# Patient Record
Sex: Female | Born: 1985
Health system: Southern US, Community
[De-identification: ages and names within clinical notes are randomized; demographics above are authoritative.]

## PROBLEM LIST (undated history)

## (undated) DIAGNOSIS — F419 Anxiety disorder, unspecified: Secondary | ICD-10-CM

## (undated) DIAGNOSIS — N920 Excessive and frequent menstruation with regular cycle: Secondary | ICD-10-CM

## (undated) DIAGNOSIS — E669 Obesity, unspecified: Secondary | ICD-10-CM

## (undated) DIAGNOSIS — I4891 Unspecified atrial fibrillation: Secondary | ICD-10-CM

## (undated) DIAGNOSIS — I10 Essential (primary) hypertension: Secondary | ICD-10-CM

## (undated) DIAGNOSIS — R112 Nausea with vomiting, unspecified: Secondary | ICD-10-CM

## (undated) DIAGNOSIS — K219 Gastro-esophageal reflux disease without esophagitis: Secondary | ICD-10-CM

## (undated) DIAGNOSIS — O24419 Gestational diabetes mellitus in pregnancy, unspecified control: Secondary | ICD-10-CM

## (undated) DIAGNOSIS — F32A Depression, unspecified: Secondary | ICD-10-CM

## (undated) DIAGNOSIS — N809 Endometriosis, unspecified: Secondary | ICD-10-CM

## (undated) DIAGNOSIS — F329 Major depressive disorder, single episode, unspecified: Secondary | ICD-10-CM

## (undated) DIAGNOSIS — I499 Cardiac arrhythmia, unspecified: Secondary | ICD-10-CM

## (undated) DIAGNOSIS — Z9889 Other specified postprocedural states: Secondary | ICD-10-CM

## (undated) DIAGNOSIS — G43909 Migraine, unspecified, not intractable, without status migrainosus: Secondary | ICD-10-CM

## (undated) HISTORY — PX: OTHER SURGICAL HISTORY: SHX169

## (undated) HISTORY — PX: HERNIA REPAIR: SHX51

## (undated) HISTORY — PX: NASAL SINUS SURGERY: SHX719

## (undated) HISTORY — PX: DILATION AND CURETTAGE OF UTERUS: SHX78

## (undated) HISTORY — PX: WISDOM TOOTH EXTRACTION: SHX21

---
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
## 2005-07-09 ENCOUNTER — Emergency Department: Payer: Self-pay | Admitting: Emergency Medicine

## 2006-01-28 ENCOUNTER — Emergency Department: Payer: Self-pay | Admitting: Emergency Medicine

## 2007-04-20 ENCOUNTER — Emergency Department: Payer: Self-pay | Admitting: Emergency Medicine

## 2007-10-09 ENCOUNTER — Emergency Department: Payer: Self-pay | Admitting: Emergency Medicine

## 2007-10-12 ENCOUNTER — Emergency Department: Payer: Self-pay | Admitting: Emergency Medicine

## 2007-10-18 ENCOUNTER — Ambulatory Visit: Payer: Self-pay | Admitting: Obstetrics and Gynecology

## 2007-12-16 ENCOUNTER — Ambulatory Visit: Payer: Self-pay | Admitting: Obstetrics and Gynecology

## 2007-12-24 ENCOUNTER — Ambulatory Visit: Payer: Self-pay | Admitting: Obstetrics and Gynecology

## 2008-12-06 ENCOUNTER — Emergency Department: Payer: Self-pay | Admitting: Emergency Medicine

## 2009-02-10 ENCOUNTER — Emergency Department: Payer: Self-pay | Admitting: Emergency Medicine

## 2009-04-25 ENCOUNTER — Emergency Department: Payer: Self-pay | Admitting: Emergency Medicine

## 2009-06-01 ENCOUNTER — Observation Stay: Payer: Self-pay

## 2009-06-03 ENCOUNTER — Observation Stay: Payer: Self-pay | Admitting: Obstetrics and Gynecology

## 2009-07-06 ENCOUNTER — Observation Stay: Payer: Self-pay | Admitting: Obstetrics & Gynecology

## 2009-07-12 ENCOUNTER — Observation Stay: Payer: Self-pay

## 2009-07-16 ENCOUNTER — Observation Stay: Payer: Self-pay | Admitting: Obstetrics and Gynecology

## 2009-07-17 ENCOUNTER — Inpatient Hospital Stay: Payer: Self-pay

## 2012-06-30 DIAGNOSIS — J310 Chronic rhinitis: Secondary | ICD-10-CM | POA: Insufficient documentation

## 2013-12-13 ENCOUNTER — Emergency Department: Payer: Self-pay | Admitting: Emergency Medicine

## 2013-12-14 LAB — CBC
HCT: 38.4 % (ref 35.0–47.0)
HGB: 12.3 g/dL (ref 12.0–16.0)
MCH: 29.7 pg (ref 26.0–34.0)
MCHC: 32.1 g/dL (ref 32.0–36.0)
MCV: 93 fL (ref 80–100)
PLATELETS: 341 10*3/uL (ref 150–440)
RBC: 4.15 10*6/uL (ref 3.80–5.20)
RDW: 14.4 % (ref 11.5–14.5)
WBC: 10.7 10*3/uL (ref 3.6–11.0)

## 2013-12-14 LAB — HCG, QUANTITATIVE, PREGNANCY: BETA HCG, QUANT.: 1821 m[IU]/mL — AB

## 2014-03-19 ENCOUNTER — Emergency Department: Payer: Self-pay | Admitting: Emergency Medicine

## 2014-03-19 LAB — COMPREHENSIVE METABOLIC PANEL
ALK PHOS: 49 U/L
ALT: 16 U/L
Albumin: 2.9 g/dL — ABNORMAL LOW (ref 3.4–5.0)
Anion Gap: 8 (ref 7–16)
BUN: 5 mg/dL — ABNORMAL LOW (ref 7–18)
Bilirubin,Total: 0.2 mg/dL (ref 0.2–1.0)
Calcium, Total: 8.6 mg/dL (ref 8.5–10.1)
Chloride: 107 mmol/L (ref 98–107)
Co2: 25 mmol/L (ref 21–32)
Creatinine: 0.55 mg/dL — ABNORMAL LOW (ref 0.60–1.30)
EGFR (Non-African Amer.): >60
Glucose: 98 mg/dL (ref 65–99)
Osmolality: 277 (ref 275–301)
Potassium: 3.3 mmol/L — ABNORMAL LOW (ref 3.5–5.1)
SGOT(AST): 10 U/L — ABNORMAL LOW (ref 15–37)
Sodium: 140 mmol/L (ref 136–145)
Total Protein: 6.7 g/dL (ref 6.4–8.2)

## 2014-03-19 LAB — CBC WITH DIFFERENTIAL/PLATELET
BASOS ABS: 0 10*3/uL (ref 0.0–0.1)
BASOS PCT: 0.3 %
Eosinophil #: 0.1 10*3/uL (ref 0.0–0.7)
Eosinophil %: 1 %
HCT: 33.9 % — AB (ref 35.0–47.0)
HGB: 11 g/dL — ABNORMAL LOW (ref 12.0–16.0)
LYMPHS PCT: 17.4 %
Lymphocyte #: 1.9 10*3/uL (ref 1.0–3.6)
MCH: 30.8 pg (ref 26.0–34.0)
MCHC: 32.5 g/dL (ref 32.0–36.0)
MCV: 95 fL (ref 80–100)
MONOS PCT: 4.8 %
Monocyte #: 0.5 x10 3/mm (ref 0.2–0.9)
Neutrophil #: 8.5 10*3/uL — ABNORMAL HIGH (ref 1.4–6.5)
Neutrophil %: 76.5 %
PLATELETS: 257 10*3/uL (ref 150–440)
RBC: 3.57 10*6/uL — AB (ref 3.80–5.20)
RDW: 13.8 % (ref 11.5–14.5)
WBC: 11.1 10*3/uL — AB (ref 3.6–11.0)

## 2014-03-19 LAB — URINALYSIS, COMPLETE
BACTERIA: NONE SEEN
BILIRUBIN, UR: NEGATIVE
Blood: NEGATIVE
Glucose,UR: NEGATIVE mg/dL (ref 0–75)
Ketone: NEGATIVE
NITRITE: NEGATIVE
Ph: 7 (ref 4.5–8.0)
Protein: NEGATIVE
Specific Gravity: 1.017 (ref 1.003–1.030)
WBC UR: 8 /HPF (ref 0–5)

## 2014-03-19 LAB — LIPASE, BLOOD: LIPASE: 82 U/L (ref 73–393)

## 2014-03-19 LAB — HCG, QUANTITATIVE, PREGNANCY: Beta Hcg, Quant.: 16727 m[IU]/mL — ABNORMAL HIGH

## 2014-06-08 ENCOUNTER — Ambulatory Visit: Payer: Self-pay | Admitting: Obstetrics and Gynecology

## 2014-06-09 ENCOUNTER — Ambulatory Visit: Payer: Self-pay | Admitting: Obstetrics and Gynecology

## 2014-06-29 ENCOUNTER — Observation Stay: Payer: Self-pay | Admitting: Obstetrics & Gynecology

## 2014-06-29 LAB — URINALYSIS, COMPLETE
BLOOD: NEGATIVE
Bilirubin,UR: NEGATIVE
Glucose,UR: NEGATIVE mg/dL (ref 0–75)
Ketone: NEGATIVE
Nitrite: NEGATIVE
PH: 6 (ref 4.5–8.0)
PROTEIN: NEGATIVE
RBC,UR: 2 /HPF (ref 0–5)
Specific Gravity: 1.021 (ref 1.003–1.030)
Squamous Epithelial: 12
WBC UR: 64 /HPF (ref 0–5)

## 2014-06-29 LAB — FETAL FIBRONECTIN
APPEARANCE: ABNORMAL
FETAL FIBRONECTIN: POSITIVE

## 2014-07-10 ENCOUNTER — Ambulatory Visit: Payer: Self-pay | Admitting: Obstetrics and Gynecology

## 2014-07-31 ENCOUNTER — Inpatient Hospital Stay: Payer: Self-pay

## 2014-10-17 NOTE — H&P (Signed)
L&D Evaluation:  History Expanded:  HPI 29 yo G3P2 at 32 5/[redacted] weeks EGA w contractions since 0300 today.  Some d/c, no LOF.  No vaginal bleeding.   Prenatal Care at [facility]. cHTN, on Labetalol, GDM, A1. A+, VI, RI. Desires Bilateral Tubal Ligation. Flu and TDaP UTD.   Gravida 3   Term 2   PreTerm 0   Abortion 0   Living 2   Blood Type (Maternal) A positive   Group B Strep Results Maternal (Result >5wks must be treated as unknown) unknown/result > 5 weeks ago  + in past pregnancies   [facility] 19-Aug-2014   Presents with contractions   Patient's Medical History No Chronic Illness   Patient's Surgical History none   Medications Pre Natal Vitamins   Allergies NKDA   Social History none   Family History Non-Contributory   ROS:  ROS All systems were reviewed.  HEENT, CNS, GI, GU, Respiratory, CV, Renal and Musculoskeletal systems were found to be normal.   Exam:  Vital Signs stable   General no apparent distress   Mental Status clear   Chest clear   Heart normal sinus rhythm, no murmur/gallop/rubs   Abdomen gravid, non-tender   Estimated Fetal Weight Average for gestational age   Back no CVAT   Edema no edema   Pelvic no external lesions, cl/50/-3   Mebranes Intact, - nitrazine and - ferning   FHT normal rate with no decels   Fetal Heart Rate 140   Ucx irregular   Skin dry   Other fetal fibronectin +, UA + for UTI   Impression:  Impression UTI, and Risks for Preterm Labor.   Plan:  Plan EFM/NST, monitor contractions and for cervical change, monitor BP   Comments Steroids Fluids ABX for UTI, cont as outpatient. Bed rest  Risks of Preterm Labor realted to +fFN discussed w patient.   Electronic Signatures: Hoyt Koch (MD)  (Signed 21-Jan-16 16:31)  Authored: L&D Evaluation   Last Updated: 21-Jan-16 16:31 by Hoyt Koch (MD)

## 2015-08-29 ENCOUNTER — Emergency Department
Admission: EM | Admit: 2015-08-29 | Discharge: 2015-08-29 | Disposition: A | Discharge status: Home / Self Care | Payer: Medicaid Other | Attending: Emergency Medicine | Admitting: Emergency Medicine

## 2015-08-29 ENCOUNTER — Emergency Department: Payer: Medicaid Other

## 2015-08-29 ENCOUNTER — Encounter: Payer: Self-pay | Admitting: Medical Oncology

## 2015-08-29 DIAGNOSIS — R1033 Periumbilical pain: Secondary | ICD-10-CM | POA: Diagnosis not present

## 2015-08-29 DIAGNOSIS — R1011 Right upper quadrant pain: Secondary | ICD-10-CM | POA: Insufficient documentation

## 2015-08-29 DIAGNOSIS — R112 Nausea with vomiting, unspecified: Secondary | ICD-10-CM

## 2015-08-29 DIAGNOSIS — Z3202 Encounter for pregnancy test, result negative: Secondary | ICD-10-CM | POA: Insufficient documentation

## 2015-08-29 DIAGNOSIS — R1013 Epigastric pain: Secondary | ICD-10-CM | POA: Diagnosis not present

## 2015-08-29 DIAGNOSIS — I10 Essential (primary) hypertension: Secondary | ICD-10-CM | POA: Insufficient documentation

## 2015-08-29 DIAGNOSIS — R1032 Left lower quadrant pain: Secondary | ICD-10-CM | POA: Insufficient documentation

## 2015-08-29 DIAGNOSIS — R109 Unspecified abdominal pain: Secondary | ICD-10-CM

## 2015-08-29 HISTORY — DX: Gastro-esophageal reflux disease without esophagitis: K21.9

## 2015-08-29 HISTORY — DX: Essential (primary) hypertension: I10

## 2015-08-29 LAB — COMPREHENSIVE METABOLIC PANEL
ALBUMIN: 4 g/dL (ref 3.5–5.0)
ALT: 18 U/L (ref 14–54)
AST: 26 U/L (ref 15–41)
Alkaline Phosphatase: 56 U/L (ref 38–126)
Anion gap: 7 (ref 5–15)
BILIRUBIN TOTAL: 0.5 mg/dL (ref 0.3–1.2)
BUN: 10 mg/dL (ref 6–20)
CO2: 25 mmol/L (ref 22–32)
CREATININE: 0.62 mg/dL (ref 0.44–1.00)
Calcium: 9 mg/dL (ref 8.9–10.3)
Chloride: 106 mmol/L (ref 101–111)
GFR calc Af Amer: >60 mL/min (ref >60)
GLUCOSE: 124 mg/dL — AB (ref 65–99)
POTASSIUM: 3.2 mmol/L — AB (ref 3.5–5.1)
SODIUM: 138 mmol/L (ref 135–145)
TOTAL PROTEIN: 7.3 g/dL (ref 6.5–8.1)

## 2015-08-29 LAB — URINALYSIS COMPLETE WITH MICROSCOPIC (ARMC ONLY)
Bilirubin Urine: NEGATIVE
GLUCOSE, UA: NEGATIVE mg/dL
Nitrite: NEGATIVE
PH: 6 (ref 5.0–8.0)
Protein, ur: 30 mg/dL — AB
Specific Gravity, Urine: 1.024 (ref 1.005–1.030)

## 2015-08-29 LAB — CBC WITH DIFFERENTIAL/PLATELET
BASOS ABS: 0 10*3/uL (ref 0–0.1)
Basophils Relative: 1 %
EOS ABS: 0.1 10*3/uL (ref 0–0.7)
EOS PCT: 2 %
HCT: 37.3 % (ref 35.0–47.0)
Hemoglobin: 12.7 g/dL (ref 12.0–16.0)
LYMPHS PCT: 27 %
Lymphs Abs: 1.6 10*3/uL (ref 1.0–3.6)
MCH: 27.9 pg (ref 26.0–34.0)
MCHC: 33.9 g/dL (ref 32.0–36.0)
MCV: 82.3 fL (ref 80.0–100.0)
Monocytes Absolute: 0.3 10*3/uL (ref 0.2–0.9)
Monocytes Relative: 6 %
NEUTROS PCT: 64 %
Neutro Abs: 3.8 10*3/uL (ref 1.4–6.5)
PLATELETS: 349 10*3/uL (ref 150–440)
RBC: 4.54 MIL/uL (ref 3.80–5.20)
RDW: 15 % — ABNORMAL HIGH (ref 11.5–14.5)
WBC: 5.8 10*3/uL (ref 3.6–11.0)

## 2015-08-29 LAB — POCT PREGNANCY, URINE: PREG TEST UR: NEGATIVE

## 2015-08-29 LAB — LIPASE, BLOOD: LIPASE: 21 U/L (ref 11–51)

## 2015-08-29 MED ORDER — SODIUM CHLORIDE 0.9 % IV BOLUS (SEPSIS)
1000.0000 mL | Freq: Once | INTRAVENOUS | Status: AC
  Administered 2015-08-29: 1000 mL via INTRAVENOUS

## 2015-08-29 MED ORDER — DICYCLOMINE HCL 20 MG PO TABS: 20.0000 mg | ORAL_TABLET | Freq: Three times a day (TID) | ORAL | Status: DC | PRN

## 2015-08-29 MED ORDER — IOPAMIDOL (ISOVUE-300) INJECTION 61%
100.0000 mL | Freq: Once | INTRAVENOUS | Status: AC | PRN
  Administered 2015-08-29: 100 mL via INTRAVENOUS

## 2015-08-29 MED ORDER — ONDANSETRON HCL 4 MG/2ML IJ SOLN
4.0000 mg | Freq: Once | INTRAMUSCULAR | Status: AC
  Administered 2015-08-29: 4 mg via INTRAVENOUS
  Filled 2015-08-29: qty 2

## 2015-08-29 MED ORDER — ONDANSETRON HCL 4 MG PO TABS: 4.0000 mg | ORAL_TABLET | Freq: Three times a day (TID) | ORAL | Status: DC | PRN

## 2015-08-29 MED ORDER — DICYCLOMINE HCL 20 MG PO TABS: 20.0000 mg | ORAL_TABLET | Freq: Four times a day (QID) | ORAL | Status: DC

## 2015-08-29 MED ORDER — ONDANSETRON HCL 4 MG PO TABS: 4.0000 mg | ORAL_TABLET | Freq: Every day | ORAL | Status: DC | PRN

## 2015-08-29 MED ORDER — IOHEXOL 240 MG/ML SOLN
25.0000 mL | Freq: Once | INTRAMUSCULAR | Status: AC | PRN
  Administered 2015-08-29: 25 mL via INTRAVENOUS

## 2015-08-29 NOTE — ED Notes (Signed)
Pt reports abd pain to center of abdomen with nausea.

## 2015-08-29 NOTE — ED Notes (Signed)
Pt finished drinking oral contrast, Sean in CT notified.

## 2015-08-29 NOTE — ED Notes (Signed)
Report called to [facility] RN

## 2015-08-29 NOTE — Discharge Instructions (Signed)
Abdominal Pain, Adult Many things can cause belly (abdominal) pain. Most times, the belly pain is not dangerous. Many cases of belly pain can be watched and treated at home. HOME CARE   Do not take medicines that help you go poop (laxatives) unless told to by your doctor.  Only take medicine as told by your doctor.  Eat or drink as told by your doctor. Your doctor will tell you if you should be on a special diet. GET HELP IF:  You do not know what is causing your belly pain.  You have belly pain while you are sick to your stomach (nauseous) or have runny poop (diarrhea).  You have pain while you pee or poop.  Your belly pain wakes you up at night.  You have belly pain that gets worse or better when you eat.  You have belly pain that gets worse when you eat fatty foods.  You have a fever. GET HELP RIGHT AWAY IF:   The pain does not go away within 2 hours.  You keep throwing up (vomiting).  The pain changes and is only in the right or left part of the belly.  You have bloody or tarry looking poop. MAKE SURE YOU:   Understand these instructions.  Will watch your condition.  Will get help right away if you are not doing well or get worse.   This information is not intended to replace advice given to you by your health care provider. Make sure you discuss any questions you have with your health care provider.   Document Released: 11/12/2007 Document Revised: 06/16/2014 Document Reviewed: 02/02/2013 Elsevier Interactive Patient Education 2016 Elsevier Inc.  Nausea and Vomiting Nausea means you feel sick to your stomach. Throwing up (vomiting) is a reflex where stomach contents come out of your mouth. HOME CARE   Take medicine as told by your doctor.  Do not force yourself to eat. However, you do need to drink fluids.  If you feel like eating, eat a normal diet as told by your doctor.  Eat rice, wheat, potatoes, bread, lean meats, yogurt, fruits, and  vegetables.  Avoid high-fat foods.  Drink enough fluids to keep your pee (urine) clear or pale yellow.  Ask your doctor how to replace body fluid losses (rehydrate). Signs of body fluid loss (dehydration) include:  Feeling very thirsty.  Dry lips and mouth.  Feeling dizzy.  Dark pee.  Peeing less than normal.  Feeling confused.  Fast breathing or heart rate. GET HELP RIGHT AWAY IF:   You have blood in your throw up.  You have black or bloody poop (stool).  You have a bad headache or stiff neck.  You feel confused.  You have bad belly (abdominal) pain.  You have chest pain or trouble breathing.  You do not pee at least once every 8 hours.  You have cold, clammy skin.  You keep throwing up after 24 to 48 hours.  You have a fever. MAKE SURE YOU:   Understand these instructions.  Will watch your condition.  Will get help right away if you are not doing well or get worse.   This information is not intended to replace advice given to you by your health care provider. Make sure you discuss any questions you have with your health care provider.   Document Released: 11/12/2007 Document Revised: 08/18/2011 Document Reviewed: 10/25/2010 Elsevier Interactive Patient Education [employer].

## 2015-08-29 NOTE — ED Provider Notes (Signed)
[facility] Emergency Department Provider Note  ____________________________________________  Time seen: Approximately 850 AM  I have reviewed the triage vital signs and the nursing notes.   HISTORY  Chief Complaint Abdominal Pain    HPI GEARLENE DEASY is a 30 y.o. female with a history of hypertension, GERD and endometriosis who is presenting to the emergency department today with periumbilical abdominal pain. She says the pain is cramping and intermittent. She only has mild pain right now. She said there was a period of time with the pain radiated through to her back. She had one episode of nonbloody vomitus. Denies any diarrhea. No known sick contacts. No history of kidney stones. Says that she has very irregular periods and does not think she is pregnant. Denies any vaginal discharge or bleeding at this time and says she did have a small amount of red blood yesterday vaginally. She said it is not abnormal for her to bleed intermittently.   Past Medical History  Diagnosis Date  . Hypertension   . GERD (gastroesophageal reflux disease)     There are no active problems to display for this patient.   Past Surgical History  Procedure Laterality Date  . Endometrial surgery    . Nasal sinus surgery      No current outpatient prescriptions on file.  Allergies Review of patient's allergies indicates no known allergies.  No family history on file.  Social History Social History  Substance Use Topics  . Smoking status: Never Smoker   . Smokeless tobacco: None  . Alcohol Use: None    Review of Systems Constitutional: No fever/chills Eyes: No visual changes. ENT: No sore throat. Cardiovascular: Denies chest pain. Respiratory: Denies shortness of breath. Gastrointestinal:   No diarrhea.  No constipation. Genitourinary: Negative for dysuria. Musculoskeletal: Negative for back pain. Skin: Negative for rash. Neurological: Negative for headaches,  focal weakness or numbness.  10-point ROS otherwise negative.  ____________________________________________   PHYSICAL EXAM:  VITAL SIGNS: ED Triage Vitals  Enc Vitals Group     BP 08/29/15 0836 152/97 mmHg     Pulse Rate 08/29/15 0836 107     Resp 08/29/15 0836 18     Temp 08/29/15 0836 98.1 F (36.7 C)     Temp Source 08/29/15 0836 Oral     SpO2 08/29/15 0836 98 %     Weight 08/29/15 0836 180 lb (81.647 kg)     Height 08/29/15 0836 5\' 1"  (1.549 m)     Head Cir --      Peak Flow --      Pain Score 08/29/15 0837 4     Pain Loc --      Pain Edu? --      Excl. in [place]? --     Constitutional: Alert and oriented. Well appearing and in no acute distress. Eyes: Conjunctivae are normal. PERRL. EOMI. Head: Atraumatic. Nose: No congestion/rhinnorhea. Mouth/Throat: Mucous membranes are moist.   Neck: No stridor.   Cardiovascular: Normal rate, regular rhythm. Grossly normal heart sounds.   Respiratory: Normal respiratory effort.  No retractions. Lungs CTAB. Gastrointestinal: Soft with tenderness periumbilically as well as the left lower quadrant and epigastrium. Mild right upper quadrant tenderness palpation with a negative Murphy sign. There is no tenderness over McBurney's point. For a mild left upper quadrant tenderness.. No distention.  No CVA tenderness. Musculoskeletal: No lower extremity tenderness nor edema.  No joint effusions. Neurologic:  Normal speech and language. No gross focal neurologic deficits are appreciated.  No gait instability. Skin:  Skin is warm, dry and intact. No rash noted. Psychiatric: Mood and affect are normal. Speech and behavior are normal.  ____________________________________________   LABS (all labs ordered are listed, but only abnormal results are displayed)  Labs Reviewed  CBC WITH DIFFERENTIAL/PLATELET - Abnormal; Notable for the following:    RDW 15.0 (*)    All other components within normal limits  COMPREHENSIVE METABOLIC PANEL -  Abnormal; Notable for the following:    Potassium 3.2 (*)    Glucose, Bld 124 (*)    All other components within normal limits  URINALYSIS COMPLETEWITH MICROSCOPIC ([facility] ONLY) - Abnormal; Notable for the following:    Color, Urine YELLOW (*)    APPearance HAZY (*)    Ketones, ur TRACE (*)    Hgb urine dipstick 2+ (*)    Protein, ur 30 (*)    Leukocytes, UA TRACE (*)    Bacteria, UA RARE (*)    Squamous Epithelial / LPF 0-5 (*)    All other components within normal limits  URINE CULTURE  LIPASE, BLOOD  POC URINE PREG, ED  POCT PREGNANCY, URINE   ____________________________________________  EKG  ED ECG REPORT I, Schaevitz,  Youlanda Roys, the attending physician, personally viewed and interpreted this ECG.   Date: 08/29/2015  EKG Time: 844  Rate: 92  Rhythm: normal EKG, normal sinus rhythm  Axis: Normal  Intervals:none  ST&T Change: No ST segment elevation or depression. No abnormal T-wave inversion.  ____________________________________________  RADIOLOGY   IMPRESSION: No acute intra-abdominal or pelvic findings.  Prominent parametrial veins, which in the appropriate clinical setting, could indicate pelvic congestion syndrome.   Electronically Signed By: Staci Righter M.D. On: 08/29/2015 10:43       ____________________________________________   PROCEDURES   ____________________________________________   INITIAL IMPRESSION / ASSESSMENT AND PLAN / ED COURSE  Pertinent labs & imaging results that were available during my care of the patient were reviewed by me and considered in my medical decision making (see chart for details).  Asked patient if she would like pain medications that she is more concerned about her nausea at this time. We'll give Zofran and IV fluids.  ----------------------------------------- 11:35 AM on 08/29/2015 -----------------------------------------  Patient says she feels better with Zofran. No vomiting in the emergency  department. Reviewed the labs as well as imaging with the patient. She is a small amount of blood in her urine which is likely related to her vaginal bleeding from yesterday. Also with pelvic congestion. Unclear etiology of this this time. Last intercourse was this past Sunday. We'll give Zofran as well as Bentyl. Patient also with a history of endometriosis. Says this feels similar to previous episodes of endometriosis but has had a debulking surgery and has not any symptoms in about 6 years. She'll be following up with her primary care doctor and possibly her OB/GYN of the symptoms continue. We'll discharge to home. No pelvic masses seen on the CAT scan.  unLikely to be a torsion. ____________________________________________   FINAL CLINICAL IMPRESSION(S) / ED DIAGNOSES  Abdominal pain with nausea and vomiting.    Orbie Pyo, MD 08/29/15 1136

## 2015-08-31 LAB — URINE CULTURE: Culture: NO GROWTH

## 2015-10-16 ENCOUNTER — Other Ambulatory Visit: Payer: Self-pay | Admitting: Family Medicine

## 2015-10-16 DIAGNOSIS — N12 Tubulo-interstitial nephritis, not specified as acute or chronic: Secondary | ICD-10-CM

## 2015-10-19 ENCOUNTER — Ambulatory Visit
Admission: RE | Admit: 2015-10-19 | Discharge: 2015-10-19 | Disposition: A | Discharge status: Home / Self Care | Payer: Medicaid Other | Source: Ambulatory Visit | Attending: Family Medicine | Admitting: Family Medicine

## 2015-10-19 DIAGNOSIS — K8020 Calculus of gallbladder without cholecystitis without obstruction: Secondary | ICD-10-CM | POA: Insufficient documentation

## 2015-10-19 DIAGNOSIS — N281 Cyst of kidney, acquired: Secondary | ICD-10-CM | POA: Diagnosis not present

## 2015-10-19 DIAGNOSIS — N12 Tubulo-interstitial nephritis, not specified as acute or chronic: Secondary | ICD-10-CM | POA: Diagnosis present

## 2015-11-27 ENCOUNTER — Encounter: Payer: Self-pay | Admitting: *Deleted

## 2015-11-27 ENCOUNTER — Inpatient Hospital Stay: Admission: RE | Admit: 2015-11-27 | Payer: Medicaid Other | Source: Ambulatory Visit

## 2015-11-27 NOTE — Patient Instructions (Signed)
  Your procedure is scheduled on: 11-29-15 [facility]) Report to Same Day Surgery 2nd floor medical mall To find out your arrival time please call [phone] between 1PM - 3PM on 11-28-15 [facility])  Remember: Instructions that are not followed completely may result in serious medical risk, up to and including death, or upon the discretion of your surgeon and anesthesiologist your surgery may need to be rescheduled.    _x___ 1. Do not eat food or drink liquids after midnight. No gum chewing or hard candies.     __x__ 2. No Alcohol for 24 hours before or after surgery.   __x__3. No Smoking for 24 prior to surgery.   ____  4. Bring all medications with you on the day of surgery if instructed.    __x__ 5. Notify your doctor if there is any change in your medical condition     (cold, fever, infections).     Do not wear jewelry, make-up, hairpins, clips or nail polish.  Do not wear lotions, powders, or perfumes. You may wear deodorant.  Do not shave 48 hours prior to surgery. Men may shave face and neck.  Do not bring valuables to the hospital.    [facility] is not responsible for any belongings or valuables.               Contacts, dentures or bridgework may not be worn into surgery.  Leave your suitcase in the car. After surgery it may be brought to your room.  For patients admitted to the hospital, discharge time is determined by your treatment team.   Patients discharged the day of surgery will not be allowed to drive home.    Please read over the following fact sheets that you were given:   [facility] Preparing for Surgery and or MRSA Information   _x___ Take these medicines the morning of surgery with A SIP OF WATER:    1. OMEPRAZOLE (PRILOSEC)  2. TAKE AN EXTRA PRILOSEC Wednesday NIGHT BEFORE BED  3.  4.  5.  6.  ____ Fleet Enema (as directed)   _x___ Use CHG Soap or sage wipes as directed on instruction sheet   ____ Use inhalers on the day of surgery and bring to  hospital day of surgery  ____ Stop metformin 2 days prior to surgery    ____ Take 1/2 of usual insulin dose the night before surgery and none on the morning of surgery.   ____ Stop aspirin or coumadin, or plavix  _x__ Stop Anti-inflammatories such as Advil, Aleve, Ibuprofen, Motrin, Naproxen,          Naprosyn, Goodies powders or aspirin products. Ok to take Tylenol.   ____ Stop supplements until after surgery.    ____ Bring C-Pap to the hospital.

## 2015-11-28 ENCOUNTER — Encounter
Admission: RE | Admit: 2015-11-28 | Discharge: 2015-11-28 | Disposition: A | Discharge status: Home / Self Care | Payer: Medicaid Other | Source: Ambulatory Visit | Attending: Obstetrics and Gynecology | Admitting: Obstetrics and Gynecology

## 2015-11-28 DIAGNOSIS — Z01812 Encounter for preprocedural laboratory examination: Secondary | ICD-10-CM | POA: Diagnosis present

## 2015-11-28 LAB — POTASSIUM: Potassium: 3.7 mmol/L (ref 3.5–5.1)

## 2015-11-29 ENCOUNTER — Encounter: Payer: Self-pay | Admitting: *Deleted

## 2015-11-29 ENCOUNTER — Ambulatory Visit: Payer: Medicaid Other | Admitting: Certified Registered Nurse Anesthetist

## 2015-11-29 ENCOUNTER — Encounter
Admission: RE | Disposition: A | Discharge status: Home / Self Care | Payer: Self-pay | Source: Ambulatory Visit | Attending: Obstetrics and Gynecology

## 2015-11-29 ENCOUNTER — Ambulatory Visit
Admission: RE | Admit: 2015-11-29 | Discharge: 2015-11-29 | Disposition: A | Discharge status: Home / Self Care | Payer: Medicaid Other | Source: Ambulatory Visit | Attending: Obstetrics and Gynecology | Admitting: Obstetrics and Gynecology

## 2015-11-29 DIAGNOSIS — F329 Major depressive disorder, single episode, unspecified: Secondary | ICD-10-CM | POA: Diagnosis not present

## 2015-11-29 DIAGNOSIS — I10 Essential (primary) hypertension: Secondary | ICD-10-CM | POA: Diagnosis not present

## 2015-11-29 DIAGNOSIS — Z79899 Other long term (current) drug therapy: Secondary | ICD-10-CM | POA: Insufficient documentation

## 2015-11-29 DIAGNOSIS — N946 Dysmenorrhea, unspecified: Secondary | ICD-10-CM | POA: Insufficient documentation

## 2015-11-29 DIAGNOSIS — K219 Gastro-esophageal reflux disease without esophagitis: Secondary | ICD-10-CM | POA: Insufficient documentation

## 2015-11-29 DIAGNOSIS — N920 Excessive and frequent menstruation with regular cycle: Secondary | ICD-10-CM | POA: Diagnosis not present

## 2015-11-29 DIAGNOSIS — Z9889 Other specified postprocedural states: Secondary | ICD-10-CM

## 2015-11-29 HISTORY — PX: LAPAROSCOPY: SHX197

## 2015-11-29 HISTORY — PX: HYSTEROSCOPY WITH D & C: SHX1775

## 2015-11-29 LAB — POCT PREGNANCY, URINE: Preg Test, Ur: NEGATIVE

## 2015-11-29 SURGERY — LAPAROSCOPY, DIAGNOSTIC
Anesthesia: General

## 2015-11-29 MED ORDER — LIDOCAINE HCL (CARDIAC) 20 MG/ML IV SOLN
INTRAVENOUS | Status: DC | PRN
  Administered 2015-11-29: 60 mg via INTRAVENOUS

## 2015-11-29 MED ORDER — SUGAMMADEX SODIUM 200 MG/2ML IV SOLN
INTRAVENOUS | Status: DC | PRN
  Administered 2015-11-29: 159.6 mg via INTRAVENOUS

## 2015-11-29 MED ORDER — BUPIVACAINE HCL 0.5 % IJ SOLN
INTRAMUSCULAR | Status: DC | PRN
  Administered 2015-11-29: 10 mL

## 2015-11-29 MED ORDER — BUPIVACAINE HCL (PF) 0.5 % IJ SOLN
INTRAMUSCULAR | Status: AC
  Filled 2015-11-29: qty 30

## 2015-11-29 MED ORDER — FENTANYL CITRATE (PF) 100 MCG/2ML IJ SOLN
INTRAMUSCULAR | Status: DC | PRN
  Administered 2015-11-29 (×2): 50 ug via INTRAVENOUS

## 2015-11-29 MED ORDER — MIDAZOLAM HCL 2 MG/2ML IJ SOLN
INTRAMUSCULAR | Status: DC | PRN
  Administered 2015-11-29: 2 mg via INTRAVENOUS

## 2015-11-29 MED ORDER — OXYCODONE-ACETAMINOPHEN 5-325 MG PO TABS: 1.0000 | ORAL_TABLET | ORAL | Status: DC | PRN

## 2015-11-29 MED ORDER — ACETAMINOPHEN 10 MG/ML IV SOLN
INTRAVENOUS | Status: DC | PRN
  Administered 2015-11-29: 1000 mg via INTRAVENOUS

## 2015-11-29 MED ORDER — KETOROLAC TROMETHAMINE 30 MG/ML IJ SOLN
INTRAMUSCULAR | Status: DC | PRN
  Administered 2015-11-29: 30 mg via INTRAVENOUS

## 2015-11-29 MED ORDER — ONDANSETRON HCL 4 MG/2ML IJ SOLN
INTRAMUSCULAR | Status: DC | PRN
  Administered 2015-11-29: 4 mg via INTRAVENOUS

## 2015-11-29 MED ORDER — LACTATED RINGERS IV SOLN
INTRAVENOUS | Status: DC
  Administered 2015-11-29 (×2): via INTRAVENOUS

## 2015-11-29 MED ORDER — DEXAMETHASONE SODIUM PHOSPHATE 10 MG/ML IJ SOLN
INTRAMUSCULAR | Status: DC | PRN
  Administered 2015-11-29: 10 mg via INTRAVENOUS

## 2015-11-29 MED ORDER — FENTANYL CITRATE (PF) 100 MCG/2ML IJ SOLN
25.0000 ug | INTRAMUSCULAR | Status: DC | PRN
  Administered 2015-11-29 (×4): 25 ug via INTRAVENOUS

## 2015-11-29 MED ORDER — ONDANSETRON HCL 4 MG/2ML IJ SOLN
4.0000 mg | Freq: Once | INTRAMUSCULAR | Status: AC | PRN
  Administered 2015-11-29: 4 mg via INTRAVENOUS

## 2015-11-29 MED ORDER — ROCURONIUM BROMIDE 100 MG/10ML IV SOLN
INTRAVENOUS | Status: DC | PRN
  Administered 2015-11-29: 30 mg via INTRAVENOUS
  Administered 2015-11-29: 10 mg via INTRAVENOUS

## 2015-11-29 MED ORDER — ONDANSETRON HCL 4 MG/2ML IJ SOLN
INTRAMUSCULAR | Status: AC
  Filled 2015-11-29: qty 2

## 2015-11-29 MED ORDER — LABETALOL HCL 5 MG/ML IV SOLN
INTRAVENOUS | Status: DC | PRN
  Administered 2015-11-29: 10 mg via INTRAVENOUS

## 2015-11-29 MED ORDER — FENTANYL CITRATE (PF) 100 MCG/2ML IJ SOLN
INTRAMUSCULAR | Status: AC
  Filled 2015-11-29: qty 2

## 2015-11-29 MED ORDER — IBUPROFEN 800 MG PO TABS: 800.0000 mg | ORAL_TABLET | Freq: Three times a day (TID) | ORAL | Status: DC | PRN

## 2015-11-29 MED ORDER — PROPOFOL 10 MG/ML IV BOLUS
INTRAVENOUS | Status: DC | PRN
  Administered 2015-11-29: 160 mg via INTRAVENOUS
  Administered 2015-11-29: 60 mg via INTRAVENOUS

## 2015-11-29 MED ORDER — PHENYLEPHRINE HCL 10 MG/ML IJ SOLN
INTRAMUSCULAR | Status: DC | PRN
  Administered 2015-11-29 (×4): 100 ug via INTRAVENOUS

## 2015-11-29 SURGICAL SUPPLY — 43 items
ABLATOR ENDOMETRIAL MYOSURE (ABLATOR) IMPLANT
BLADE SURG SZ11 CARB STEEL (BLADE) ×4 IMPLANT
CANISTER SUC SOCK COL 7IN (MISCELLANEOUS) ×4 IMPLANT
CANISTER SUCT 1200ML W/VALVE (MISCELLANEOUS) ×4 IMPLANT
CATH ROBINSON RED A/P 16FR (CATHETERS) ×4 IMPLANT
CHLORAPREP W/TINT 26ML (MISCELLANEOUS) ×4 IMPLANT
DEVICE MYOSURE LITE (MISCELLANEOUS) IMPLANT
DRSG TEGADERM 2-3/8X2-3/4 SM (GAUZE/BANDAGES/DRESSINGS) ×4 IMPLANT
ELECT REM PT RETURN 9FT ADLT (ELECTROSURGICAL) ×4
ELECTRODE REM PT RTRN 9FT ADLT (ELECTROSURGICAL) ×2 IMPLANT
GAUZE SPONGE NON-WVN 2X2 STRL (MISCELLANEOUS) ×2 IMPLANT
GLOVE BIO SURGEON STRL SZ7 (GLOVE) ×4 IMPLANT
GLOVE INDICATOR 7.5 STRL GRN (GLOVE) ×32 IMPLANT
GOWN STRL REUS W/ TWL LRG LVL3 (GOWN DISPOSABLE) ×4 IMPLANT
GOWN STRL REUS W/TWL LRG LVL3 (GOWN DISPOSABLE) ×4
IRRIGATION STRYKERFLOW (MISCELLANEOUS) IMPLANT
IRRIGATOR STRYKERFLOW (MISCELLANEOUS)
IV LACTATED RINGERS 1000ML (IV SOLUTION) ×4 IMPLANT
KIT RM TURNOVER CYSTO AR (KITS) ×4 IMPLANT
LABEL OR SOLS (LABEL) ×4 IMPLANT
LIQUID BAND (GAUZE/BANDAGES/DRESSINGS) ×4 IMPLANT
NS IRRIG 500ML POUR BTL (IV SOLUTION) ×4 IMPLANT
PACK DNC HYST (MISCELLANEOUS) IMPLANT
PACK GYN LAPAROSCOPIC (MISCELLANEOUS) ×4 IMPLANT
PAD OB MATERNITY 4.3X12.25 (PERSONAL CARE ITEMS) ×4 IMPLANT
PAD PREP 24X41 OB/GYN DISP (PERSONAL CARE ITEMS) ×4 IMPLANT
SCISSORS METZENBAUM CVD 33 (INSTRUMENTS) IMPLANT
SEAL ROD LENS SCOPE MYOSURE (ABLATOR) ×4 IMPLANT
SHEARS HARMONIC ACE PLUS 36CM (ENDOMECHANICALS) IMPLANT
SLEEVE ENDOPATH XCEL 5M (ENDOMECHANICALS) ×4 IMPLANT
SOL .9 NS 3000ML IRR  AL (IV SOLUTION)
SOL .9 NS 3000ML IRR UROMATIC (IV SOLUTION) IMPLANT
SPONGE VERSALON 2X2 STRL (MISCELLANEOUS) ×2
SUT MNCRL AB 4-0 PS2 18 (SUTURE) ×4 IMPLANT
SUT VIC AB 0 CT2 27 (SUTURE) IMPLANT
SYRINGE 10CC LL (SYRINGE) ×4 IMPLANT
TOWEL OR 17X26 4PK STRL BLUE (TOWEL DISPOSABLE) ×4 IMPLANT
TROCAR ENDO BLADELESS 11MM (ENDOMECHANICALS) IMPLANT
TROCAR XCEL NON-BLD 5MMX100MML (ENDOMECHANICALS) ×4 IMPLANT
TUBING CONNECTING 10 (TUBING) ×3 IMPLANT
TUBING CONNECTING 10' (TUBING) ×1
TUBING HYSTEROSCOPY DOLPHIN (MISCELLANEOUS) ×4 IMPLANT
TUBING INSUFFLATOR HI FLOW (MISCELLANEOUS) ×4 IMPLANT

## 2015-11-29 NOTE — H&P (Signed)
Initial paper  H&P on chart:   History reviewed, patient examined, no change in status, stable for surgery.  

## 2015-11-29 NOTE — Anesthesia Procedure Notes (Signed)
Procedure Name: Intubation Performed by: Demetrius Charity Pre-anesthesia Checklist: Patient identified, Patient being monitored, Timeout performed, Emergency Drugs available and Suction available Patient Re-evaluated:Patient Re-evaluated prior to inductionOxygen Delivery Method: Circle system utilized Preoxygenation: Pre-oxygenation with 100% oxygen Intubation Type: IV induction Ventilation: Mask ventilation without difficulty Laryngoscope Size: Mac and 3 Grade View: Grade III Tube type: Oral Tube size: 7.0 mm Number of attempts: 1 Airway Equipment and Method: Stylet Placement Confirmation: ETT inserted through vocal cords under direct vision,  positive ETCO2 and breath sounds checked- equal and bilateral Secured at: 21 cm Tube secured with: Tape Dental Injury: Teeth and Oropharynx as per pre-operative assessment

## 2015-11-29 NOTE — Transfer of Care (Signed)
Immediate Anesthesia Transfer of Care Note  Patient: Debra Whitney  Procedure(s) Performed: Procedure(s): LAPAROSCOPY DIAGNOSTIC (N/A) DILATATION AND CURETTAGE /HYSTEROSCOPY  Patient Location: PACU  Anesthesia Type:General  Level of Consciousness: sedated  Airway & Oxygen Therapy: Patient Spontanous Breathing and Patient connected to face mask oxygen  Post-op Assessment: Report given to RN and Post -op Vital signs reviewed and stable  Post vital signs: Reviewed and stable  Last Vitals:  Filed Vitals:   11/29/15 1112  BP: 125/87  Pulse: 92  Temp: 36.7 C  Resp: 12    Last Pain:  Filed Vitals:   11/29/15 1113  PainSc: 2          Complications: No apparent anesthesia complications

## 2015-11-29 NOTE — Discharge Instructions (Signed)

## 2015-11-29 NOTE — Anesthesia Preprocedure Evaluation (Addendum)
Anesthesia Evaluation  Patient identified by MRN, date of birth, ID band Patient awake    Reviewed: Allergy & Precautions, NPO status , Patient's Chart, lab work & pertinent test results  Airway Mallampati: III  TM Distance: >3 FB     Dental no notable dental hx.    Pulmonary neg pulmonary ROS,    Pulmonary exam normal        Cardiovascular hypertension, Pt. on medications Normal cardiovascular exam     Neuro/Psych  Headaches, negative psych ROS   GI/Hepatic Neg liver ROS, GERD  Medicated and Controlled,  Endo/Other  negative endocrine ROS  Renal/GU      Musculoskeletal negative musculoskeletal ROS (+)   Abdominal Normal abdominal exam  (+)   Peds  Hematology negative hematology ROS (+)   Anesthesia Other Findings   Reproductive/Obstetrics negative OB ROS                            Anesthesia Physical Anesthesia Plan  ASA: II  Anesthesia Plan: General   Post-op Pain Management:    Induction: Intravenous  Airway Management Planned: Oral ETT  Additional Equipment:   Intra-op Plan:   Post-operative Plan: Extubation in OR  Informed Consent: I have reviewed the patients History and Physical, chart, labs and discussed the procedure including the risks, benefits and alternatives for the proposed anesthesia with the patient or authorized representative who has indicated his/her understanding and acceptance.   Dental advisory given  Plan Discussed with: Surgeon and CRNA  Anesthesia Plan Comments:         Anesthesia Quick Evaluation

## 2015-11-29 NOTE — Op Note (Signed)
Patient Name: Debra Whitney Date of Procedure: 11/29/2015  Preoperative Diagnosis: 1) 30 y.o. with menorrhagia 2) History of endometriosis  Postoperative Diagnosis: 1) 30 y.o. with menorrhagia 2) No evidence of endometriosis  Operation Performed: Diagnostic laparoscopy, hysteroscopy, dilation and curettage  Indication: Menorrhagia and history of endometriosis  Anesthesia: General  Primary Surgeon: Malachy Mood, MD  Assistant: none  Preoperative Antibiotics: none  Estimated Blood Loss: minimal  IV Fluids: 858mL  Urine Output:: ~4mL straight cath  Drains or Tubes: none  Implants: none  Specimens Removed: endometrial curettings  Complications: none  Intraoperative Findings: Normal uterus, cervix, and tubes.  Ureters normal in course and caliber.  No evidence of hernias or adhesive disease.  Normal liver edge, normal appendix.    Patient Condition: stable  Procedure in Detail:  Patient was taken to the operating room were she was administered general endotracheal anesthesia.  She was positioned in the dorsal lithotomy position utilizing Allen stirups, prepped and draped in the usual sterile fashion.  Uterus was noted to be non-enlarged in size, anteverted.   Prior to proceeding with the case a time out was performed.  Attention was turned to the patient's pelvis.  A red rubber catheter was used to empty the patient's bladder.  An operative speculum was placed to allow visualization of the cervix.  The anterior lip of the cervix was grasped with a single tooth tenaculum and a Hulka tenaculum was placed to allow manipulation of the uterus.   Attention was turned to the patient's abdomen.  Palmer's point was infiltrated with 1% Sensorcaine, before making a stab incision using an 11 blade scalpel.  A 54mm Excel trocar was then used to gain direct entry into the peritoneal cavity utilizing the camera to visualize progress of the trocar during placement.  Once peritoneal entry  had been achieved, insufflation was started and pneumoperitoneum established at a pressure of 82mmHg.     A second 66mm excel trocar was placed at the umbilicus after injection the site with 1% Sensorcaine and making a stab incision wusing an 11 blade scalpel.  The  trocar was placed through this incision under direct visualization.  General inspection of the abdomen revealed the above noted findings.  Pneumoperitoneum was evacuated.  The trocars were removed.  All trocar sites were then dressed with surgical skin glue.  The Hulka tenaculum was removed.  The operative speculum was replaced, anterior lip of the cervix was grasped with a single tooth tenaculum, and the cervix was sequentially dilated using pratt dilators.  The hysteroscope was then advanced into the uterine cavity noting the above findings.  Sharp curettage was performed and the resulting specimen collected and sent to pathology.    The single tooth tenaculum was removed from the cervix.  The tenaculum sites and cervix were noted to be hemostatic before removing the operative speculum.  Sponge needle and instrument counts were corrects times two.  The patient tolerated the procedure well and was taken to the recovery room in stable condition.

## 2015-11-30 ENCOUNTER — Encounter: Payer: Self-pay | Admitting: Obstetrics and Gynecology

## 2015-11-30 NOTE — Anesthesia Postprocedure Evaluation (Signed)
Anesthesia Post Note  Patient: Debra Whitney  Procedure(s) Performed: Procedure(s) (LRB): LAPAROSCOPY DIAGNOSTIC (N/A) DILATATION AND CURETTAGE /HYSTEROSCOPY  Patient location during evaluation: PACU Anesthesia Type: General Level of consciousness: awake and alert and oriented Pain management: pain level controlled Vital Signs Assessment: post-procedure vital signs reviewed and stable Respiratory status: spontaneous breathing Cardiovascular status: blood pressure returned to baseline Anesthetic complications: no    Last Vitals:  Filed Vitals:   11/29/15 1631 11/29/15 1725  BP: 105/67 115/92  Pulse: 82 94  Temp: 36.6 C   Resp: 16 16    Last Pain:  Filed Vitals:   11/30/15 0832  PainSc: 2                  Jamil Castillo

## 2015-12-03 LAB — SURGICAL PATHOLOGY

## 2016-07-28 ENCOUNTER — Ambulatory Visit: Payer: Self-pay | Admitting: Physician Assistant

## 2016-07-28 VITALS — BP 120/86 | HR 96 | Temp 97.7°F

## 2016-07-28 DIAGNOSIS — R197 Diarrhea, unspecified: Secondary | ICD-10-CM

## 2016-07-28 LAB — POCT INFLUENZA A/B
INFLUENZA B, POC: NEGATIVE
Influenza A, POC: NEGATIVE

## 2016-07-28 NOTE — Progress Notes (Signed)
S:  Pt c/osore throat and diarrhea, sx for 1 day, cough for 1 week, some body aches, no fever/chills, no abd pain except for cramping with diarrhea; denies cp/sob, denies camping, bad food, recent antibiotics, or exposure to bad water Remainder ros neg  O:  Vitals wnl, nad, ENT wnl, neck supple no lymph, lungs c t a, cv rrr, abd soft nontender bs increased lower quads b/l, neuro intact, flu swab neg  A:  Viral gastroenteritis  P:  Reassurance, fluids, brat diet, immodium ad for diarrhea if needed, rx phenergan 25mg  tid prn vomiting, return if not better in 3 days, return earlier if worsening Work note given

## 2016-08-18 ENCOUNTER — Ambulatory Visit: Payer: Medicaid Other | Admitting: Family

## 2016-08-20 ENCOUNTER — Encounter: Payer: Self-pay | Admitting: Physician Assistant

## 2016-08-20 ENCOUNTER — Ambulatory Visit: Payer: Self-pay | Admitting: Physician Assistant

## 2016-08-20 VITALS — BP 130/80 | HR 110 | Temp 98.5°F

## 2016-08-20 DIAGNOSIS — Z3201 Encounter for pregnancy test, result positive: Secondary | ICD-10-CM

## 2016-08-20 DIAGNOSIS — R197 Diarrhea, unspecified: Secondary | ICD-10-CM

## 2016-08-20 LAB — POCT URINALYSIS DIPSTICK
Bilirubin, UA: NEGATIVE
Glucose, UA: NEGATIVE
KETONES UA: NEGATIVE
LEUKOCYTES UA: NEGATIVE
NITRITE UA: NEGATIVE
PH UA: 6
Spec Grav, UA: 1.03
UROBILINOGEN UA: 0.2

## 2016-08-20 LAB — POCT URINE PREGNANCY: PREG TEST UR: POSITIVE — AB

## 2016-08-20 MED ORDER — ONDANSETRON HCL 4 MG PO TABS: 4.0000 mg | ORAL_TABLET | Freq: Three times a day (TID) | ORAL | 0 refills | Status: DC | PRN

## 2016-08-20 NOTE — Progress Notes (Signed)
S: c/o sore throat, nausea, diarrhea x 3 today, none since 11am, her supervisor told her to get checked and to go home today, also no period since Jan, took preg test last week and week before but both were negative, states the lower abd pain feels like someone is twisting a towel, remainder ros neg  O: vitals wnl, nad, ent wnl, neck supple no lymph, lungs c t a ,cv rrr, abd soft nontender bs normal all 4 quads, ua wnl, urine preg +  A: positive preg test, 1st trimester pregnancy, nausea, diarrhea  P: zofran, f/u with gyn for eval, if increased abdominal pain go to ER

## 2016-08-21 ENCOUNTER — Ambulatory Visit (INDEPENDENT_AMBULATORY_CARE_PROVIDER_SITE_OTHER): Payer: 59 | Admitting: Obstetrics and Gynecology

## 2016-08-21 ENCOUNTER — Encounter: Payer: Self-pay | Admitting: Obstetrics and Gynecology

## 2016-08-21 VITALS — BP 143/83 | HR 105 | Ht 61.0 in | Wt 179.4 lb

## 2016-08-21 DIAGNOSIS — O200 Threatened abortion: Secondary | ICD-10-CM

## 2016-08-21 NOTE — Addendum Note (Signed)
Addended by: Raliegh Ip on: 08/21/2016 02:44 PM   Modules accepted: Orders

## 2016-08-21 NOTE — Progress Notes (Signed)
HPI:      [name] is a 31 y.o. [phone] who LMP was Patient's last menstrual period was 07/03/2016 (approximate).  Subjective:   She presents today after having a positive pregnancy test which surprised her. She is not using anything for birth control. She is also having spotting without cramping. She states that her last menses was complicated by spotting in the first trimester. She also had gestational diabetes with her last pregnancy. She has had 3 previous vaginal births.    Hx: The following portions of the patient's history were reviewed and updated as appropriate:              She  has a past medical history of GERD (gastroesophageal reflux disease); Headache; and Hypertension. She  does not have a problem list on file. She  has a past surgical history that includes endometrial surgery; Nasal sinus surgery; Hernia repair; Wisdom tooth extraction; laparoscopy (N/A, 11/29/2015); Hysteroscopy w/D&C (11/29/2015); and Dilation and curettage of uterus. Her family history includes Diabetes in her mother; Hypertension in her mother. She  reports that she has never smoked. She has never used smokeless tobacco. She reports that she does not drink alcohol or use drugs. Current Outpatient Prescriptions on File Prior to Visit  Medication Sig Dispense Refill  . omeprazole (PRILOSEC) 20 MG capsule Take 40 mg by mouth every morning.     . ondansetron (ZOFRAN) 4 MG tablet Take 1 tablet (4 mg total) by mouth every 8 (eight) hours as needed for nausea or vomiting. 20 tablet 0  . dicyclomine (BENTYL) 20 MG tablet Take 1 tablet (20 mg total) by mouth every 6 (six) hours. (Patient not taking: Reported on 11/23/2015) 15 tablet 0  . fluticasone (FLONASE) 50 MCG/ACT nasal spray Place 1 spray into both nostrils daily as needed.    . hydrochlorothiazide (HYDRODIURIL) 25 MG tablet Take 25 mg by mouth daily.    Marland Kitchen ibuprofen (ADVIL,MOTRIN) 800 MG tablet Take 1 tablet (800 mg total) by mouth every 8 (eight)  hours as needed for mild pain or moderate pain. (Patient not taking: Reported on 08/21/2016) 40 tablet 0  . norethindrone-ethinyl estradiol (MICROGESTIN,JUNEL,LOESTRIN) 1-20 MG-MCG tablet Take 1 tablet by mouth 2 (two) times daily.    Marland Kitchen oxyCODONE-acetaminophen (PERCOCET/ROXICET) 5-325 MG tablet Take 1 tablet by mouth every 4 (four) hours as needed. (Patient not taking: Reported on 07/28/2016) 20 tablet 0  . sertraline (ZOLOFT) 50 MG tablet Take 50 mg by mouth at bedtime.     No current facility-administered medications on file prior to visit.          Review of Systems:  Review of Systems  Constitutional: Denied constitutional symptoms, night sweats, recent illness, fatigue, fever, insomnia and weight loss.  Eyes: Denied eye symptoms, eye pain, photophobia, vision change and visual disturbance.  Ears/Nose/Throat/Neck: Denied ear, nose, throat or neck symptoms, hearing loss, nasal discharge, sinus congestion and sore throat.  Cardiovascular: Denied cardiovascular symptoms, arrhythmia, chest pain/pressure, edema, exercise intolerance, orthopnea and palpitations.  Respiratory: Denied pulmonary symptoms, asthma, pleuritic pain, productive sputum, cough, dyspnea and wheezing.  Gastrointestinal: Denied, gastro-esophageal reflux, melena, nausea and vomiting.  Genitourinary: See HPI for additional information regarding first trimester spotting.   Musculoskeletal: Denied musculoskeletal symptoms, stiffness, swelling, muscle weakness and myalgia.  Dermatologic: Denied dermatology symptoms, rash and scar.  Neurologic: Denied neurology symptoms, dizziness, headache, neck pain and syncope.  Psychiatric: Denied psychiatric symptoms, anxiety and depression.  Endocrine: Denied endocrine symptoms including hot flashes and night sweats.  Meds:   Current Outpatient Prescriptions on File Prior to Visit  Medication Sig Dispense Refill  . omeprazole (PRILOSEC) 20 MG capsule Take 40 mg by mouth every morning.      . ondansetron (ZOFRAN) 4 MG tablet Take 1 tablet (4 mg total) by mouth every 8 (eight) hours as needed for nausea or vomiting. 20 tablet 0  . dicyclomine (BENTYL) 20 MG tablet Take 1 tablet (20 mg total) by mouth every 6 (six) hours. (Patient not taking: Reported on 11/23/2015) 15 tablet 0  . fluticasone (FLONASE) 50 MCG/ACT nasal spray Place 1 spray into both nostrils daily as needed.    . hydrochlorothiazide (HYDRODIURIL) 25 MG tablet Take 25 mg by mouth daily.    Marland Kitchen ibuprofen (ADVIL,MOTRIN) 800 MG tablet Take 1 tablet (800 mg total) by mouth every 8 (eight) hours as needed for mild pain or moderate pain. (Patient not taking: Reported on 08/21/2016) 40 tablet 0  . norethindrone-ethinyl estradiol (MICROGESTIN,JUNEL,LOESTRIN) 1-20 MG-MCG tablet Take 1 tablet by mouth 2 (two) times daily.    Marland Kitchen oxyCODONE-acetaminophen (PERCOCET/ROXICET) 5-325 MG tablet Take 1 tablet by mouth every 4 (four) hours as needed. (Patient not taking: Reported on 07/28/2016) 20 tablet 0  . sertraline (ZOLOFT) 50 MG tablet Take 50 mg by mouth at bedtime.     No current facility-administered medications on file prior to visit.     Objective:     Vitals:   08/21/16 1333  BP: (!) 143/83  Pulse: (!) 105              Physical examination General NAD, Conversant  HEENT Atraumatic; Op clear with mmm.  Normo-cephalic. Pupils reactive. Anicteric sclerae  Thyroid/Neck Smooth without nodularity or enlargement. Normal ROM.  Neck Supple.  Skin No rashes, lesions or ulceration. Normal palpated skin turgor. No nodularity.  Breasts: No masses or discharge.  Symmetric.  No axillary adenopathy.  Lungs: Clear to auscultation.No rales or wheezes. Normal Respiratory effort, no retractions.  Heart: NSR.  No murmurs or rubs appreciated. No periferal edema  Abdomen: Soft.  Non-tender.  No masses.  No HSM. No hernia  Extremities: Moves all appropriately.  Normal ROM for age. No lymphadenopathy.  Neuro: Oriented to PPT.  Normal mood. Normal  affect.     Pelvic:   Vulva: Normal appearance.  No lesions.  Vagina: No lesions or abnormalities noted. Vaginal bleeding from the cervix noted   Support: Normal pelvic support.  Urethra No masses tenderness or scarring.  Meatus Normal size without lesions or prolapse.  Cervix: Normal appearance.  No lesions.  Closed   Anus: Normal exam.  No lesions.  Perineum: Normal exam.  No lesions.        Bimanual   Adnexae: No masses.  Non-tender to palpation.  Uterus: Enlarged. 6 weeks  Non-tender.  Mobile.  AV.  Adnexae: No masses.  Non-tender to palpation.  Cul-de-sac: Negative for abnormality.  Adnexae: No masses.  Non-tender to palpation.         Pelvimetry   Diagonal: Reached.  Spines: Average.  Sacrum: Concave.  Pubic Arch: Normal.      Assessment:    G4P3003 There are no active problems to display for this patient.    1. Threatened abortion in early pregnancy       Plan:            Prenatal Plan 1.  The patient was given prenatal literature. 2.  She was begun on prenatal vitamins. 3.  A prenatal lab panel was ordered  or drawn to include hemoglobin A1c. 4.  An ultrasound was ordered to better determine an EDC and to check fetal viability because of first trimester spotting. 5.  Recommend early 1 hour GCT.  Orders Orders Placed This Encounter  Procedures  . US OB Transvaginal    No orders of the defined types were placed in this encounter.       F/U  Return in about 4 weeks (around 09/18/2016).  Finis Bud, M.D. 08/21/2016 2:21 PM

## 2016-08-22 ENCOUNTER — Ambulatory Visit (INDEPENDENT_AMBULATORY_CARE_PROVIDER_SITE_OTHER): Payer: 59

## 2016-08-22 ENCOUNTER — Encounter: Payer: Self-pay | Admitting: Obstetrics and Gynecology

## 2016-08-22 ENCOUNTER — Other Ambulatory Visit: Payer: Self-pay

## 2016-08-22 DIAGNOSIS — O200 Threatened abortion: Secondary | ICD-10-CM

## 2016-08-23 LAB — PAP IG, CT-NG NAA, HPV HIGH-RISK
CHLAMYDIA, NUC. ACID AMP: NEGATIVE
Gonococcus by Nucleic Acid Amp: NEGATIVE
HPV, HIGH-RISK: NEGATIVE
PAP Smear Comment: 0

## 2016-08-23 LAB — BETA HCG QUANT (REF LAB): hCG Quant: 43 m[IU]/mL

## 2016-08-25 ENCOUNTER — Ambulatory Visit: Payer: Medicaid Other | Admitting: Family

## 2016-08-25 ENCOUNTER — Encounter: Payer: Self-pay | Admitting: Obstetrics and Gynecology

## 2016-08-25 ENCOUNTER — Telehealth: Payer: Self-pay | Admitting: Family

## 2016-08-25 ENCOUNTER — Ambulatory Visit (INDEPENDENT_AMBULATORY_CARE_PROVIDER_SITE_OTHER): Payer: 59 | Admitting: Obstetrics and Gynecology

## 2016-08-25 ENCOUNTER — Other Ambulatory Visit: Payer: Self-pay | Admitting: Obstetrics and Gynecology

## 2016-08-25 VITALS — BP 136/88 | HR 101 | Wt 179.5 lb

## 2016-08-25 DIAGNOSIS — O039 Complete or unspecified spontaneous abortion without complication: Secondary | ICD-10-CM | POA: Diagnosis not present

## 2016-08-25 NOTE — Telephone Encounter (Signed)
FYI

## 2016-08-25 NOTE — Progress Notes (Signed)
HPI:      [name] is a 31 y.o. [phone] who LMP was Patient's last menstrual period was 07/03/2016 (approximate).  Subjective:   She presents today after having heavy bleeding and cramping on Saturday. She continues to bleed today but it is not as heavy. She had an ultrasound on Friday which revealed an empty uterus and no adnexal masses. Her quantitative beta hCG from Friday was 67.    Hx: The following portions of the patient's history were reviewed and updated as appropriate:             She  has a past medical history of GERD (gastroesophageal reflux disease); Headache; and Hypertension. She  does not have a problem list on file. She  has a past surgical history that includes endometrial surgery; Nasal sinus surgery; Hernia repair; Wisdom tooth extraction; laparoscopy (N/A, 11/29/2015); Hysteroscopy w/D&C (11/29/2015); and Dilation and curettage of uterus. She has a current medication list which includes the following prescription(s): omeprazole, dicyclomine, fluticasone, hydrochlorothiazide, ibuprofen, norethindrone-ethinyl estradiol, ondansetron, oxycodone-acetaminophen, and sertraline.       Review of Systems:  Review of Systems  Constitutional: Denied constitutional symptoms, night sweats, recent illness, fatigue, fever, insomnia and weight loss.  Eyes: Denied eye symptoms, eye pain, photophobia, vision change and visual disturbance.  Ears/Nose/Throat/Neck: Denied ear, nose, throat or neck symptoms, hearing loss, nasal discharge, sinus congestion and sore throat.  Cardiovascular: Denied cardiovascular symptoms, arrhythmia, chest pain/pressure, edema, exercise intolerance, orthopnea and palpitations.  Respiratory: Denied pulmonary symptoms, asthma, pleuritic pain, productive sputum, cough, dyspnea and wheezing.  Gastrointestinal: Denied, gastro-esophageal reflux, melena, nausea and vomiting.  Genitourinary: See HPI for additional information.  Musculoskeletal: Denied  musculoskeletal symptoms, stiffness, swelling, muscle weakness and myalgia.  Dermatologic: Denied dermatology symptoms, rash and scar.  Neurologic: Denied neurology symptoms, dizziness, headache, neck pain and syncope.  Psychiatric: Denied psychiatric symptoms, anxiety and depression.  Endocrine: Denied endocrine symptoms including hot flashes and night sweats.   Meds:   Current Outpatient Prescriptions on File Prior to Visit  Medication Sig Dispense Refill  . omeprazole (PRILOSEC) 20 MG capsule Take 40 mg by mouth every morning.     . dicyclomine (BENTYL) 20 MG tablet Take 1 tablet (20 mg total) by mouth every 6 (six) hours. (Patient not taking: Reported on 11/23/2015) 15 tablet 0  . fluticasone (FLONASE) 50 MCG/ACT nasal spray Place 1 spray into both nostrils daily as needed.    . hydrochlorothiazide (HYDRODIURIL) 25 MG tablet Take 25 mg by mouth daily.    Marland Kitchen ibuprofen (ADVIL,MOTRIN) 800 MG tablet Take 1 tablet (800 mg total) by mouth every 8 (eight) hours as needed for mild pain or moderate pain. (Patient not taking: Reported on 08/21/2016) 40 tablet 0  . norethindrone-ethinyl estradiol (MICROGESTIN,JUNEL,LOESTRIN) 1-20 MG-MCG tablet Take 1 tablet by mouth 2 (two) times daily.    . ondansetron (ZOFRAN) 4 MG tablet Take 1 tablet (4 mg total) by mouth every 8 (eight) hours as needed for nausea or vomiting. (Patient not taking: Reported on 08/25/2016) 20 tablet 0  . oxyCODONE-acetaminophen (PERCOCET/ROXICET) 5-325 MG tablet Take 1 tablet by mouth every 4 (four) hours as needed. (Patient not taking: Reported on 07/28/2016) 20 tablet 0  . sertraline (ZOLOFT) 50 MG tablet Take 50 mg by mouth at bedtime.     No current facility-administered medications on file prior to visit.     Objective:     Vitals:   08/25/16 0812  BP: 136/88  Pulse: (!) 101  Ultrasound revealing empty uterus  Quantitative beta hCG 43  We have discussed these results and review them in detail.  Assessment:     G4P3003 There are no active problems to display for this patient.    1. SAB (spontaneous abortion)        Plan:            1.  We have discussed miscarriage in detail. I will follow Quants to 0. Ectopic warnings given. Possible incomplete AB discussed in detail. We have discussed future fertility, birth control discussed as well. Patient to follow up in 4 weeks when she has reached a decision regarding immediate fertility versus birth control.  Orders Orders Placed This Encounter  Procedures  . hCG, quantitative, pregnancy    No orders of the defined types were placed in this encounter.       F/U  Return in about 4 weeks (around 09/22/2016).  Finis Bud, M.D. 08/25/2016 9:04 AM

## 2016-08-25 NOTE — Telephone Encounter (Signed)
FYI - Pt called and cancelled stated that she was at another doctor's appt for a miscarriage that she had. Was advised by that doctor to go home and rest.

## 2016-08-26 ENCOUNTER — Telehealth: Payer: Self-pay | Admitting: *Deleted

## 2016-08-26 ENCOUNTER — Encounter: Payer: Self-pay | Admitting: Obstetrics and Gynecology

## 2016-08-26 LAB — BETA HCG QUANT (REF LAB): HCG QUANT: 11 m[IU]/mL

## 2016-08-26 NOTE — Telephone Encounter (Signed)
Done

## 2016-08-26 NOTE — Telephone Encounter (Signed)
Patient called and is inquiring about her lab results. Patient is requesting a call back. Her number is [phone]. Please advise . Thank you

## 2016-09-01 ENCOUNTER — Other Ambulatory Visit: Payer: 59

## 2016-09-01 DIAGNOSIS — O021 Missed abortion: Secondary | ICD-10-CM

## 2016-09-02 LAB — BETA HCG QUANT (REF LAB): hCG Quant: <1 m[IU]/mL

## 2016-09-17 DIAGNOSIS — D224 Melanocytic nevi of scalp and neck: Secondary | ICD-10-CM | POA: Diagnosis not present

## 2016-09-17 DIAGNOSIS — D229 Melanocytic nevi, unspecified: Secondary | ICD-10-CM | POA: Diagnosis not present

## 2016-09-17 DIAGNOSIS — L918 Other hypertrophic disorders of the skin: Secondary | ICD-10-CM | POA: Diagnosis not present

## 2016-09-17 DIAGNOSIS — D485 Neoplasm of uncertain behavior of skin: Secondary | ICD-10-CM | POA: Diagnosis not present

## 2016-09-23 ENCOUNTER — Ambulatory Visit (INDEPENDENT_AMBULATORY_CARE_PROVIDER_SITE_OTHER): Payer: 59 | Admitting: Obstetrics and Gynecology

## 2016-09-23 ENCOUNTER — Encounter: Payer: Self-pay | Admitting: Obstetrics and Gynecology

## 2016-09-23 VITALS — HR 106 | Ht 61.0 in | Wt 179.3 lb

## 2016-09-23 DIAGNOSIS — O039 Complete or unspecified spontaneous abortion without complication: Secondary | ICD-10-CM

## 2016-09-23 DIAGNOSIS — F419 Anxiety disorder, unspecified: Secondary | ICD-10-CM

## 2016-09-23 DIAGNOSIS — I10 Essential (primary) hypertension: Secondary | ICD-10-CM

## 2016-09-23 NOTE — Progress Notes (Signed)
HPI:      [name] is a 31 y.o. [phone] who LMP was No LMP recorded.  Subjective:   She presents today For follow-up after spontaneous miscarriage. She has not had a menstrual period yet. She has no complaints regarding miscarriage. She has hypertension and is currently not taking her medication. She is seeking a new primary care physician. She also has anxiety and is considering medication for this but wants to be off medication during her pregnancy. This is significant because she would like to attempt pregnancy in the next 2-3 months.    Hx: The following portions of the patient's history were reviewed and updated as appropriate:              She  has a past medical history of GERD (gastroesophageal reflux disease); Headache; and Hypertension. She  does not have a problem list on file. She  has a past surgical history that includes endometrial surgery; Nasal sinus surgery; Hernia repair; Wisdom tooth extraction; laparoscopy (N/A, 11/29/2015); Hysteroscopy w/D&C (11/29/2015); and Dilation and curettage of uterus. Her family history includes Diabetes in her mother; Hypertension in her mother. She  reports that she has never smoked. She has never used smokeless tobacco. She reports that she does not drink alcohol or use drugs. Current Outpatient Prescriptions on File Prior to Visit  Medication Sig Dispense Refill  . omeprazole (PRILOSEC) 20 MG capsule Take 40 mg by mouth every morning.      No current facility-administered medications on file prior to visit.          Review of Systems:  Review of Systems  Constitutional: Denied constitutional symptoms, night sweats, recent illness, fatigue, fever, insomnia and weight loss.  Eyes: Denied eye symptoms, eye pain, photophobia, vision change and visual disturbance.  Ears/Nose/Throat/Neck: Denied ear, nose, throat or neck symptoms, hearing loss, nasal discharge, sinus congestion and sore throat.  Cardiovascular: Denied  cardiovascular symptoms, arrhythmia, chest pain/pressure, edema, exercise intolerance, orthopnea and palpitations.  Respiratory: Denied pulmonary symptoms, asthma, pleuritic pain, productive sputum, cough, dyspnea and wheezing.  Gastrointestinal: Denied, gastro-esophageal reflux, melena, nausea and vomiting.  Genitourinary: Denied genitourinary symptoms including symptomatic vaginal discharge, pelvic relaxation issues, and urinary complaints.  Musculoskeletal: Denied musculoskeletal symptoms, stiffness, swelling, muscle weakness and myalgia.  Dermatologic: Denied dermatology symptoms, rash and scar.  Neurologic: Denied neurology symptoms, dizziness, headache, neck pain and syncope.  Psychiatric: Denied psychiatric symptoms, anxiety and depression.  Endocrine: Denied endocrine symptoms including hot flashes and night sweats.   Meds:   Current Outpatient Prescriptions on File Prior to Visit  Medication Sig Dispense Refill  . omeprazole (PRILOSEC) 20 MG capsule Take 40 mg by mouth every morning.      No current facility-administered medications on file prior to visit.     Objective:     Vitals:   09/23/16 0843  Pulse: (!) 106                Assessment:    G4P3003 There are no active problems to display for this patient.    1. SAB (spontaneous abortion)   2. Essential hypertension   3. Anxiety     Patient doing well after spontaneous miscarriage.  Essential hypertension not currently controlled.   Plan:            1.  We have discussed miscarriage, future fertility, timing of intercourse and pregnancy. Prenatal vitamin use discussed. Patient would like to attempt pregnancy in the next 3 months.  2.  We have  discussed her hypertension in detail. Medications that are safe during pregnancy were reviewed and I have asked that she discuss this with her primary care physician and take an antihypertension medication that she could continue during her pregnancy.  3. She will also  speak with her primary care physician regarding her anxiety. We have discussed the possibility of counseling versus medication the risks and benefits of each.         F/U  Return in about 6 months (around 03/25/2017). I spent 17 minutes with this patient of which greater than 50% was spent discussing miscarriage, future fertility, hypertension in pregnancy and medication use, anxiety.  Finis Bud, M.D. 09/23/2016 9:24 AM

## 2016-10-20 ENCOUNTER — Observation Stay
Admission: EM | Admit: 2016-10-20 | Discharge: 2016-10-21 | Disposition: A | Discharge status: Home / Self Care | Payer: 59 | Attending: Internal Medicine | Admitting: Internal Medicine

## 2016-10-20 ENCOUNTER — Encounter: Payer: Self-pay | Admitting: Emergency Medicine

## 2016-10-20 ENCOUNTER — Observation Stay (HOSPITAL_BASED_OUTPATIENT_CLINIC_OR_DEPARTMENT_OTHER)
Admit: 2016-10-20 | Discharge: 2016-10-20 | Disposition: A | Discharge status: Home / Self Care | Payer: 59 | Attending: Physician Assistant | Admitting: Physician Assistant

## 2016-10-20 ENCOUNTER — Emergency Department: Payer: 59

## 2016-10-20 DIAGNOSIS — K219 Gastro-esophageal reflux disease without esophagitis: Secondary | ICD-10-CM | POA: Diagnosis not present

## 2016-10-20 DIAGNOSIS — Z6833 Body mass index (BMI) 33.0-33.9, adult: Secondary | ICD-10-CM | POA: Diagnosis not present

## 2016-10-20 DIAGNOSIS — R1110 Vomiting, unspecified: Secondary | ICD-10-CM | POA: Diagnosis present

## 2016-10-20 DIAGNOSIS — R002 Palpitations: Secondary | ICD-10-CM | POA: Insufficient documentation

## 2016-10-20 DIAGNOSIS — I4891 Unspecified atrial fibrillation: Principal | ICD-10-CM | POA: Diagnosis present

## 2016-10-20 DIAGNOSIS — E669 Obesity, unspecified: Secondary | ICD-10-CM | POA: Diagnosis not present

## 2016-10-20 DIAGNOSIS — R0602 Shortness of breath: Secondary | ICD-10-CM | POA: Diagnosis not present

## 2016-10-20 DIAGNOSIS — Z9889 Other specified postprocedural states: Secondary | ICD-10-CM | POA: Insufficient documentation

## 2016-10-20 DIAGNOSIS — R112 Nausea with vomiting, unspecified: Secondary | ICD-10-CM | POA: Diagnosis not present

## 2016-10-20 DIAGNOSIS — I10 Essential (primary) hypertension: Secondary | ICD-10-CM | POA: Diagnosis present

## 2016-10-20 DIAGNOSIS — O24419 Gestational diabetes mellitus in pregnancy, unspecified control: Secondary | ICD-10-CM | POA: Diagnosis present

## 2016-10-20 DIAGNOSIS — Z885 Allergy status to narcotic agent status: Secondary | ICD-10-CM | POA: Insufficient documentation

## 2016-10-20 DIAGNOSIS — Z79899 Other long term (current) drug therapy: Secondary | ICD-10-CM | POA: Insufficient documentation

## 2016-10-20 DIAGNOSIS — E876 Hypokalemia: Secondary | ICD-10-CM | POA: Diagnosis not present

## 2016-10-20 DIAGNOSIS — R000 Tachycardia, unspecified: Secondary | ICD-10-CM | POA: Diagnosis not present

## 2016-10-20 DIAGNOSIS — I119 Hypertensive heart disease without heart failure: Secondary | ICD-10-CM | POA: Insufficient documentation

## 2016-10-20 HISTORY — DX: Obesity, unspecified: E66.9

## 2016-10-20 HISTORY — DX: Gestational diabetes mellitus in pregnancy, unspecified control: O24.419

## 2016-10-20 HISTORY — DX: Excessive and frequent menstruation with regular cycle: N92.0

## 2016-10-20 HISTORY — DX: Unspecified atrial fibrillation: I48.91

## 2016-10-20 HISTORY — DX: Endometriosis, unspecified: N80.9

## 2016-10-20 HISTORY — DX: Migraine, unspecified, not intractable, without status migrainosus: G43.909

## 2016-10-20 LAB — HEPARIN LEVEL (UNFRACTIONATED): HEPARIN UNFRACTIONATED: 0.64 [IU]/mL (ref 0.30–0.70)

## 2016-10-20 LAB — COMPREHENSIVE METABOLIC PANEL
ALBUMIN: 4.6 g/dL (ref 3.5–5.0)
ALT: 27 U/L (ref 14–54)
AST: 25 U/L (ref 15–41)
Alkaline Phosphatase: 51 U/L (ref 38–126)
Anion gap: 9 (ref 5–15)
BUN: 11 mg/dL (ref 6–20)
CHLORIDE: 103 mmol/L (ref 101–111)
CO2: 28 mmol/L (ref 22–32)
CREATININE: 0.7 mg/dL (ref 0.44–1.00)
Calcium: 9.1 mg/dL (ref 8.9–10.3)
GFR calc Af Amer: >60 mL/min (ref >60)
GLUCOSE: 99 mg/dL (ref 65–99)
Potassium: 3.1 mmol/L — ABNORMAL LOW (ref 3.5–5.1)
Sodium: 140 mmol/L (ref 135–145)
Total Bilirubin: 0.5 mg/dL (ref 0.3–1.2)
Total Protein: 8.1 g/dL (ref 6.5–8.1)

## 2016-10-20 LAB — CBC
HEMATOCRIT: 40 % (ref 35.0–47.0)
Hemoglobin: 13.3 g/dL (ref 12.0–16.0)
MCH: 27.5 pg (ref 26.0–34.0)
MCHC: 33.3 g/dL (ref 32.0–36.0)
MCV: 82.5 fL (ref 80.0–100.0)
Platelets: 396 10*3/uL (ref 150–440)
RBC: 4.85 MIL/uL (ref 3.80–5.20)
RDW: 14.8 % — ABNORMAL HIGH (ref 11.5–14.5)
WBC: 9 10*3/uL (ref 3.6–11.0)

## 2016-10-20 LAB — PROTIME-INR
INR: 0.95
PROTHROMBIN TIME: 12.7 s (ref 11.4–15.2)

## 2016-10-20 LAB — TROPONIN I

## 2016-10-20 LAB — MAGNESIUM: MAGNESIUM: 1.9 mg/dL (ref 1.7–2.4)

## 2016-10-20 LAB — POTASSIUM: Potassium: 3.7 mmol/L (ref 3.5–5.1)

## 2016-10-20 LAB — HCG, QUANTITATIVE, PREGNANCY

## 2016-10-20 LAB — APTT: aPTT: 24 seconds (ref 24–36)

## 2016-10-20 LAB — TSH: TSH: 2.011 u[IU]/mL (ref 0.350–4.500)

## 2016-10-20 MED ORDER — POTASSIUM CHLORIDE CRYS ER 20 MEQ PO TBCR
30.0000 meq | EXTENDED_RELEASE_TABLET | Freq: Two times a day (BID) | ORAL | Status: DC
  Administered 2016-10-20: 30 meq via ORAL
  Filled 2016-10-20: qty 2

## 2016-10-20 MED ORDER — POLYETHYLENE GLYCOL 3350 17 G PO PACK: 17.0000 g | PACK | Freq: Every day | ORAL | Status: DC | PRN

## 2016-10-20 MED ORDER — POTASSIUM CHLORIDE CRYS ER 20 MEQ PO TBCR
40.0000 meq | EXTENDED_RELEASE_TABLET | ORAL | Status: AC
  Administered 2016-10-20: 40 meq via ORAL

## 2016-10-20 MED ORDER — DILTIAZEM HCL 25 MG/5ML IV SOLN: 20.0000 mg | Freq: Once | INTRAVENOUS | Status: DC

## 2016-10-20 MED ORDER — SODIUM CHLORIDE 0.9 % IV SOLN: 250.0000 mL | INTRAVENOUS | Status: DC | PRN

## 2016-10-20 MED ORDER — HEPARIN (PORCINE) IN NACL 100-0.45 UNIT/ML-% IJ SOLN
1150.0000 [IU]/h | INTRAMUSCULAR | Status: DC
  Administered 2016-10-20 – 2016-10-21 (×2): 1150 [IU]/h via INTRAVENOUS
  Filled 2016-10-20 (×3): qty 250

## 2016-10-20 MED ORDER — POTASSIUM CHLORIDE CRYS ER 20 MEQ PO TBCR
EXTENDED_RELEASE_TABLET | ORAL | Status: AC
  Administered 2016-10-20: 40 meq via ORAL
  Filled 2016-10-20: qty 2

## 2016-10-20 MED ORDER — ACETAMINOPHEN 325 MG PO TABS
650.0000 mg | ORAL_TABLET | Freq: Four times a day (QID) | ORAL | Status: DC | PRN
  Administered 2016-10-21: 650 mg via ORAL
  Filled 2016-10-20: qty 2

## 2016-10-20 MED ORDER — OFF THE BEAT BOOK
Freq: Once | Status: AC
  Administered 2016-10-20

## 2016-10-20 MED ORDER — METOPROLOL TARTRATE 5 MG/5ML IV SOLN
INTRAVENOUS | Status: AC
  Administered 2016-10-20: 5 mg via INTRAVENOUS
  Filled 2016-10-20: qty 5

## 2016-10-20 MED ORDER — DEXTROSE 5 % IV SOLN
5.0000 mg/h | INTRAVENOUS | Status: DC
  Administered 2016-10-20: 12.5 mg/h via INTRAVENOUS
  Filled 2016-10-20 (×3): qty 100

## 2016-10-20 MED ORDER — SODIUM CHLORIDE 0.9% FLUSH: 3.0000 mL | Freq: Two times a day (BID) | INTRAVENOUS | Status: DC

## 2016-10-20 MED ORDER — HEPARIN BOLUS VIA INFUSION
4000.0000 [IU] | Freq: Once | INTRAVENOUS | Status: AC
  Administered 2016-10-20: 4000 [IU] via INTRAVENOUS
  Filled 2016-10-20: qty 4000

## 2016-10-20 MED ORDER — ONDANSETRON HCL 4 MG/2ML IJ SOLN
4.0000 mg | Freq: Four times a day (QID) | INTRAMUSCULAR | Status: DC | PRN
  Administered 2016-10-20: 4 mg via INTRAVENOUS
  Filled 2016-10-20: qty 2

## 2016-10-20 MED ORDER — ACETAMINOPHEN 650 MG RE SUPP: 650.0000 mg | Freq: Four times a day (QID) | RECTAL | Status: DC | PRN

## 2016-10-20 MED ORDER — DILTIAZEM HCL 100 MG IV SOLR
5.0000 mg/h | INTRAVENOUS | Status: DC
  Administered 2016-10-20: 5 mg/h via INTRAVENOUS
  Filled 2016-10-20: qty 100

## 2016-10-20 MED ORDER — SODIUM CHLORIDE 0.9% FLUSH: 3.0000 mL | INTRAVENOUS | Status: DC | PRN

## 2016-10-20 MED ORDER — METOPROLOL TARTRATE 25 MG PO TABS
25.0000 mg | ORAL_TABLET | Freq: Four times a day (QID) | ORAL | Status: DC
  Administered 2016-10-20 – 2016-10-21 (×3): 25 mg via ORAL
  Filled 2016-10-20 (×3): qty 1

## 2016-10-20 MED ORDER — METOPROLOL TARTRATE 5 MG/5ML IV SOLN
5.0000 mg | Freq: Once | INTRAVENOUS | Status: AC
  Administered 2016-10-20: 5 mg via INTRAVENOUS
  Filled 2016-10-20: qty 5

## 2016-10-20 MED ORDER — METOPROLOL TARTRATE 5 MG/5ML IV SOLN
5.0000 mg | Freq: Once | INTRAVENOUS | Status: AC
  Administered 2016-10-20: 5 mg via INTRAVENOUS

## 2016-10-20 MED ORDER — ESMOLOL HCL-SODIUM CHLORIDE 2000 MG/100ML IV SOLN
25.0000 ug/kg/min | Freq: Once | INTRAVENOUS | Status: AC
  Administered 2016-10-20: 25 ug/kg/min via INTRAVENOUS
  Filled 2016-10-20: qty 100

## 2016-10-20 MED ORDER — ONDANSETRON HCL 4 MG PO TABS
4.0000 mg | ORAL_TABLET | Freq: Four times a day (QID) | ORAL | Status: DC | PRN
  Administered 2016-10-21: 4 mg via ORAL
  Filled 2016-10-20: qty 1

## 2016-10-20 NOTE — ED Notes (Signed)
MD states to wait until result of hcg prior to giving cardizem.

## 2016-10-20 NOTE — ED Provider Notes (Signed)
[facility] Emergency Department Provider Note   ____________________________________________    I have reviewed the triage vital signs and the nursing notes.   HISTORY  Chief Complaint Chest Pain     HPI Debra Whitney is a 31 y.o. female who presents with complaints of chest pain shortness of breath and palpitations. Patient reports 2 nights ago she went out with friends and did something very atypical for her which was consume alcohol, she reports she had 4 margaritas. The next morning she felt very ill with multiple episodes of nausea and vomiting at approximately noon that day she developed chest pressure and palpitations as well as shortness of breath with any ambulation. She does report the past she has had shortness of breath with ambulation and periods where she becomes flushed.   Past Medical History:  Diagnosis Date  . Atrial fibrillation ([place])    a. diagnosed on 10/20/2016; chads2vasc => at least 2 (HTN, sex category), possibly 3 given history of gestational diabetes  . Endometriosis   . GERD (gastroesophageal reflux disease)   . Gestational diabetes   . Hypertension   . Menorrhagia   . Migraine   . Obesity     Patient Active Problem List   Diagnosis Date Noted  . Atrial fibrillation with RVR ([place]) 10/20/2016  . New onset atrial fibrillation ([place]) 10/20/2016  . Hypokalemia 10/20/2016  . Essential hypertension 10/20/2016  . Gestational diabetes 10/20/2016  . Obesity 10/20/2016  . Vomiting 10/20/2016  . A-fib ([place]) 10/20/2016    Past Surgical History:  Procedure Laterality Date  . DILATION AND CURETTAGE OF UTERUS    . endometrial surgery    . HERNIA REPAIR    . HYSTEROSCOPY W/D&C  11/29/2015   Procedure: DILATATION AND CURETTAGE /HYSTEROSCOPY;  Surgeon: Malachy Mood, MD;  Location: [facility] ORS;  Service: Gynecology;;  . LAPAROSCOPY N/A 11/29/2015   Procedure: LAPAROSCOPY DIAGNOSTIC;  Surgeon: Malachy Mood, MD;   Location: [facility] ORS;  Service: Gynecology;  Laterality: N/A;  . NASAL SINUS SURGERY    . WISDOM TOOTH EXTRACTION      Prior to Admission medications   Medication Sig Start Date End Date Taking? Authorizing Provider  loratadine (CLARITIN) 10 MG tablet Take 10 mg by mouth daily.   Yes [provider]  omeprazole (PRILOSEC) 20 MG capsule Take 40 mg by mouth every morning.    Yes [provider]  ondansetron (ZOFRAN) 4 MG tablet Take 4 mg by mouth every 8 (eight) hours as needed for nausea or vomiting.   Yes [provider]     Allergies Percocet [oxycodone-acetaminophen]  Family History  Problem Relation Age of Onset  . Diabetes Mother   . Hypertension Mother   . CVA Maternal Grandmother     Social History Social History  Substance Use Topics  . Smoking status: Never Smoker  . Smokeless tobacco: Never Used  . Alcohol use No    Review of Systems  Constitutional: No fever/chills Eyes: No visual changes.  ENT: No sore throat. Cardiovascular: Chest pressure as above Respiratory: Shortness of breath with exertion Gastrointestinal: Positive nausea and vomiting Genitourinary: Negative for dysuria. Musculoskeletal: Negative for back pain. Skin: Negative for rash. Neurological: Negative for headaches or weakness   ____________________________________________   PHYSICAL EXAM:  VITAL SIGNS: ED Triage Vitals  Enc Vitals Group     BP 10/20/16 0834 (!) 146/119     Pulse Rate 10/20/16 0834 99     Resp 10/20/16 0834 20  Temp 10/20/16 0834 98.3 F (36.8 C)     Temp Source 10/20/16 0834 Oral     SpO2 10/20/16 0834 99 %     Weight 10/20/16 0836 180 lb (81.6 kg)     Height 10/20/16 0836 5\' 1"  (1.549 m)     Head Circumference --      Peak Flow --      Pain Score 10/20/16 0833 5     Pain Loc --      Pain Edu? --      Excl. in [place]? --     Constitutional: Alert and oriented. No acute distress. Pleasant and interactive Eyes: Conjunctivae are  normal.  Head: Atraumatic. Nose: No congestion/rhinnorhea. Mouth/Throat: Mucous membranes are moist.   Neck:  Painless ROM Cardiovascular: Tachycardia, Irregularly irregular rhythm. Grossly normal heart sounds.  Good peripheral circulation. Respiratory: Normal respiratory effort.  No retractions. Lungs CTAB. Gastrointestinal: Soft and nontender. No distention.  No CVA tenderness. Genitourinary: deferred Musculoskeletal: No lower extremity tenderness nor edema.  Warm and well perfused Neurologic:  Normal speech and language. No gross focal neurologic deficits are appreciated.  Skin:  Skin is warm, dry and intact. No rash noted. Psychiatric: Mood and affect are normal. Speech and behavior are normal.  ____________________________________________   LABS (all labs ordered are listed, but only abnormal results are displayed)  Labs Reviewed  CBC - Abnormal; Notable for the following:       Result Value   RDW 14.8 (*)    All other components within normal limits  COMPREHENSIVE METABOLIC PANEL - Abnormal; Notable for the following:    Potassium 3.1 (*)    All other components within normal limits  TROPONIN I  TSH  APTT  PROTIME-INR  HCG, QUANTITATIVE, PREGNANCY  MAGNESIUM  HEMOGLOBIN A1C  POTASSIUM  HIV ANTIBODY (ROUTINE TESTING)  URINE DRUG SCREEN, QUALITATIVE ([facility] ONLY)   ____________________________________________  EKG  ED ECG REPORT I, Lavonia Drafts, the attending physician, personally viewed and interpreted this ECG.  Date: 10/20/2016  Rate: 176 Rhythm: Atrial fibrillation QRS Axis: normal Intervals: normal ST/T Wave abnormalities: normal Conduction Disturbances: none Narrative Interpretation: Rapid ventricular response  ____________________________________________  RADIOLOGY  Chest x-ray shows cardiomegaly ____________________________________________   PROCEDURES  Procedure(s) performed: No    Critical Care performed: yes  CRITICAL  CARE Performed by: Lavonia Drafts   Total critical care time: 30 minutes  Critical care time was exclusive of separately billable procedures and treating other patients.  Critical care was necessary to treat or prevent imminent or life-threatening deterioration.  Critical care was time spent personally by me on the following activities: development of treatment plan with patient and/or surrogate as well as nursing, discussions with consultants, evaluation of patient's response to treatment, examination of patient, obtaining history from patient or surrogate, ordering and performing treatments and interventions, ordering and review of laboratory studies, ordering and review of radiographic studies, pulse oximetry and re-evaluation of patient's condition.  ____________________________________________   INITIAL IMPRESSION / ASSESSMENT AND PLAN / ED COURSE  Pertinent labs & imaging results that were available during my care of the patient were reviewed by me and considered in my medical decision making (see chart for details).  Plan was to administer 20 mg Cardizem bolus for new-onset A. fib, likely related to alcohol ingestion. However notified by pharmacy that IV Cardizem is not available, I will substitute metoprolol 5 mg injection  Patient given 2 doses of metoprolol 5 mg with little improvement. Discussed with Dr. Fletcher Anon of cardiology who recommends  esmolol drip and admission    ____________________________________________   FINAL CLINICAL IMPRESSION(S) / ED DIAGNOSES  Final diagnoses:  Atrial fibrillation with rapid ventricular response (HCC)      NEW MEDICATIONS STARTED DURING THIS VISIT:  New Prescriptions   No medications on file     Note:  This document was prepared using Dragon voice recognition software and may include unintentional dictation errors.    Lavonia Drafts, MD 10/20/16 [phone]

## 2016-10-20 NOTE — ED Notes (Signed)
Patient given warm blanket at this time.  

## 2016-10-20 NOTE — ED Notes (Signed)
Cardiology here to see pt.  Cardiology states that pt is fine to go to 2A.  He changed the order.  AC notified.  Will notify bed placement

## 2016-10-20 NOTE — ED Triage Notes (Signed)
States feeling of rapid heart rate since yesterday, chest pressure, arrived at work, EKG done as noted.

## 2016-10-20 NOTE — ED Notes (Signed)
Dr. Kinner in room to assess patient.   

## 2016-10-20 NOTE — ED Notes (Signed)
Call to 2A, RN to call me back for report

## 2016-10-20 NOTE — ED Notes (Signed)
Pt asked to ambulate to the toilet in the room to void.  Pt was able to do so.  Pt states that she continues to have pressure on her chest which she had prior to coming in.  Pt also states that its harder to breathe, not sob but takes more effort to get her breath in.  Pt HR increased to 140 after getting up.  Pt continues to have mild nausea.  Will call AC to update

## 2016-10-20 NOTE — Consult Note (Signed)
Cardiology Consultation Note  Patient ID: Debra Whitney, MRN: [id number], DOB/AGE: 08/05/1985 31 y.o. Admit date: 10/20/2016   Date of Consult: 10/20/2016 Primary Physician: Burnard Hawthorne, FNP Primary Cardiologist: New to [facility] - consult by Fletcher Anon Requesting Physician: Dr. Darvin Neighbours, MD  Chief Complaint: Palpitations/chest pressure/SOB Reason for Consult: New onset Afib with RVR  HPI: Debra Whitney is a 31 y.o. female who is being seen today for the evaluation of new onset Afib with RVR at the request of Dr. Darvin Neighbours, MD. Patient has a h/o essential hypertension, headache disorder, menorrhagia s/p prior D&C, endometriosis, GERD, SAB, and obesity who presented to [facility] with palpitations, chest pressure, and SOB. She was found to be in new onset Afib with RVR with heart rates in the 170s bpm.  No previously known cardiac history. Patient reports previously being on HCTZ and metoprolol for her hypertension, though has been taken off all antihypertensive medications as of the end of 2018. She reports HCTZ was stopped 2/2 AKI and she is uncertain why metoprolol was discontinued. She reports a BP in the 160s at her office visit on 09/23/16 though this is not noted in her chart that day.    Patient notes for the past several months on an almost daily occurrence she will suddenly develop flushing with increased SOB. No chest pain or palpitations. No dizziness, presyncope, or syncope.   Patient reports she typically never drinks. However, on 5/12 she was at a party with some friends and reports having had approximately 4 large margaritas. This was followed by "many" episodes of vomiting on 5/13. She last vomited in the evening of 5/13. No diarrhea. SHe reports being "hung over" on 5/13. She went to a park for Mother's Day gathering with her family and reports she "just didn't feel right." She thought it was her "hang over," but around noon she developed severe palpitations, increased SOB, and chest  pressure. These symptoms persisted all through the night prompting her to come to [facility].  Of note, this episode has been nothing like her nearly daily episodes of flushing as above. She denies ever smoking tobacco and denies ever using illegal drugs.   Upon the patient's arrival to [facility] they were found to be in new onset Afib with RVR. BP 146/119, HR 176, temp 98.3, oxygen saturation 99% on room air, weight 180 pounds. EKG showed Afib with RVR, 176 bpm, nonspecific st/t changes, CXR showed cardiomegaly without acute cardiopulmonary process. Labs showed potassium 3.1, TSH normal, troponin negative x 1, SCr 0.70, hgb 13.3, wbc 9.0, plt 396. She was given Iv metoprolol 5 mg in the Ed with mild improvement in her heart rate to the 120-150 bpm range. Case was discussed between ED MD and Dr. Fletcher Anon, Esmolol gtt started by ED.  Past Medical History:  Diagnosis Date  . GERD (gastroesophageal reflux disease)   . Headache    HEADACHES EVERY DAY  . Hypertension       Most Recent Cardiac Studies: none   Surgical History:  Past Surgical History:  Procedure Laterality Date  . DILATION AND CURETTAGE OF UTERUS    . endometrial surgery    . HERNIA REPAIR    . HYSTEROSCOPY W/D&C  11/29/2015   Procedure: DILATATION AND CURETTAGE /HYSTEROSCOPY;  Surgeon: Malachy Mood, MD;  Location: [facility] ORS;  Service: Gynecology;;  . LAPAROSCOPY N/A 11/29/2015   Procedure: LAPAROSCOPY DIAGNOSTIC;  Surgeon: Malachy Mood, MD;  Location: [facility] ORS;  Service: Gynecology;  Laterality: N/A;  . NASAL SINUS SURGERY    .  WISDOM TOOTH EXTRACTION       Home Meds: Prior to Admission medications   Medication Sig Start Date End Date Taking? Authorizing Provider  loratadine (CLARITIN) 10 MG tablet Take 10 mg by mouth daily.   Yes [provider]  omeprazole (PRILOSEC) 20 MG capsule Take 40 mg by mouth every morning.    Yes [provider]  ondansetron (ZOFRAN) 4 MG tablet Take 4 mg by mouth every 8 (eight)  hours as needed for nausea or vomiting.   Yes [provider]    Inpatient Medications:  . potassium chloride  30 mEq Oral BID  . potassium chloride  40 mEq Oral STAT   . esmolol      Allergies:  Allergies  Allergen Reactions  . Percocet [Oxycodone-Acetaminophen] Nausea And Vomiting    Social History   Social History  . Marital status: Married    Spouse name: N/A  . Number of children: N/A  . Years of education: N/A   Occupational History  . Not on file.   Social History Main Topics  . Smoking status: Never Smoker  . Smokeless tobacco: Never Used  . Alcohol use No  . Drug use: No  . Sexual activity: Yes    Birth control/ protection: None   Other Topics Concern  . Not on file   Social History Narrative  . No narrative on file     Family History  Problem Relation Age of Onset  . Diabetes Mother   . Hypertension Mother      Review of Systems: Review of Systems  Constitutional: Positive for malaise/fatigue. Negative for chills, diaphoresis, fever and weight loss.  HENT: Negative for congestion.   Eyes: Negative for discharge and redness.  Respiratory: Positive for shortness of breath. Negative for cough, hemoptysis and wheezing.   Cardiovascular: Positive for chest pain and palpitations. Negative for orthopnea, claudication, leg swelling and PND.  Gastrointestinal: Positive for nausea and vomiting. Negative for abdominal pain, blood in stool, constipation, diarrhea, heartburn and melena.  Genitourinary: Negative for hematuria.  Musculoskeletal: Negative for falls and myalgias.  Skin: Negative for rash.  Neurological: Positive for weakness. Negative for dizziness, tingling, tremors, sensory change, speech change, focal weakness and loss of consciousness.  Endo/Heme/Allergies: Does not bruise/bleed easily.  Psychiatric/Behavioral: Negative for substance abuse. The patient is not nervous/anxious.     Labs:  Recent Labs  10/20/16 0853  TROPONINI  <0.03   Lab Results  Component Value Date   WBC 9.0 10/20/2016   HGB 13.3 10/20/2016   HCT 40.0 10/20/2016   MCV 82.5 10/20/2016   PLT 396 10/20/2016     Recent Labs Lab 10/20/16 0853  NA 140  K 3.1*  CL 103  CO2 28  BUN 11  CREATININE 0.70  CALCIUM 9.1  PROT 8.1  BILITOT 0.5  ALKPHOS 51  ALT 27  AST 25  GLUCOSE 99   No results found for: CHOL, HDL, LDLCALC, TRIG No results found for: DDIMER  Radiology/Studies:  Dg Chest 1 View  Result Date: 10/20/2016 CLINICAL DATA:  Tachycardia.  Chest pressure. EXAM: CHEST 1 VIEW COMPARISON:  No recent prior . FINDINGS: Mediastinum and hilar structures normal. Lungs are clear. No pleural effusion or pneumothorax. Cardiomegaly with normal pulmonary vascularity. IMPRESSION: 1. Cardiomegaly with normal pulmonary vascularity. 2. No acute pulmonary disease . Electronically Signed   By: Marcello Moores  Register   On: 10/20/2016 09:14    EKG: Interpreted by me showed: Afib with RVR, 176 bpm, nonspecific st/t changes  Telemetry: Interpreted by me showed: Afib with RVR, 120s to 150s bpm  Weights: Filed Weights   10/20/16 0836  Weight: 180 lb (81.6 kg)     Physical Exam: Blood pressure 136/85, pulse 77, temperature 98.3 F (36.8 C), temperature source Oral, resp. rate 19, height 5\' 1"  (1.549 m), weight 180 lb (81.6 kg), SpO2 96 %, unknown if currently breastfeeding. Body mass index is 34.01 kg/m. General: Well developed, well nourished, in no acute distress. Head: Normocephalic, atraumatic, sclera non-icteric, no xanthomas, nares are without discharge.  Neck: Negative for carotid bruits. JVD not elevated. Lungs: Clear bilaterally to auscultation without wheezes, rales, or rhonchi. Breathing is unlabored. Heart: Tachycardic, irregularly irregular with S1 S2. No murmurs, rubs, or gallops appreciated. Abdomen: Obese, soft, non-tender, non-distended with normoactive bowel sounds. No hepatomegaly. No rebound/guarding. No obvious abdominal  masses. Msk:  Strength and tone appear normal for age. Extremities: No clubbing or cyanosis. No edema. Distal pedal pulses are 2+ and equal bilaterally. Neuro: Alert and oriented X 3. No facial asymmetry. No focal deficit. Moves all extremities spontaneously. Psych:  Responds to questions appropriately with a normal affect.    Assessment and Plan:  Principal Problem:   Atrial fibrillation with RVR ([place]) Active Problems:   New onset atrial fibrillation (HCC)   Hypokalemia   Vomiting   Essential hypertension   Gestational diabetes   Obesity    1. New onset Afib with RVR: -Remains with tachycardic heart rates in the 120s to 150s bpm  -Likely in the setting of her hypokalemia 2/2 "many" episodes of vomiting on 5/13, though given she notes a couple month history of palpitations, increased SOB, and flushing we are unable to definitively state exactly when she went into this rhythm -Heart rate has mildly improved with IV metoprolol 5 mg -Starting on Esmolol gtt per MD -Recommend repletion of hypokalemia to a goal of at least 4.0, I ordered 40 mEq to be given stat followed by another 30 mg mEq x 2 this afternoon and evening -Check magnesium and replete to goal of at least 2.0 as indicated -TSH ok -Troponin x 1 negative, continue to cycle to rule out -Check TTE when heart rate is better controlled -If she remains with significant tachycardia despite electrolyte repletion she may require TEE/DCCV. Given this, I will start her on heparin gtt as her CHADS2VASc is at least 2 (HTN, sex category) and possibly 3 considering her history of gestational diabetes, though there is no diagnosis of ongoing diabetes as a result at this time -Would ideally like to avoid long term, full-dose anticoagulation in her given her age as well as given her history of menorrhagia -Perhaps this is an isolated event int he setting of hypokalemia as above -Consider outpatient cardiac monitoring to evaluate for increased  ectopy  -Monitor closely on heparin gtt -Check urine drug screen   2. Hypokalemia: -As above  3. Nausea/vomiting: -Likely in the setting of increased alcohol abuse on 5/12 as the patient does not drink alcohol regularly  -Improved -Supportive care per IM  4. HTN: -Previously on HCTZ and metoprolol though HCTZ was stopped several months back 2/2 AKI and metoprolol was discontinued 2/2 uncertain reasons by the patient -Currently well controlled in the 151V systolic    5. Gestational diabetes: -Check hgb A1c  6. Obesity: -Weight loss advised -May benefit from outpatient sleep study if she has recurrent Afib   Signed, Debra Whitney [place] Pager: [phone] 10/20/2016, 10:55 AM

## 2016-10-20 NOTE — Progress Notes (Signed)
ANTICOAGULATION CONSULT NOTE - Initial Consult  Pharmacy Consult for Heparin Drip Indication: atrial fibrillation  Allergies  Allergen Reactions  . Percocet [Oxycodone-Acetaminophen] Nausea And Vomiting    Patient Measurements: Height: 5\' 1"  (154.9 cm) Weight: 180 lb (81.6 kg) IBW/kg (Calculated) : 47.8 Heparin Dosing Weight: 66.3 kg  Vital Signs: Temp: 98.3 F (36.8 C) (05/14 0834) Temp Source: Oral (05/14 0834) BP: 98/74 (05/14 1300) Pulse Rate: 114 (05/14 1300)  Labs:  Recent Labs  10/20/16 0853  HGB 13.3  HCT 40.0  PLT 396  APTT 24  LABPROT 12.7  INR 0.95  CREATININE 0.70  TROPONINI <0.03    Estimated Creatinine Clearance: 99.5 mL/min (by C-G formula based on SCr of 0.7 mg/dL).   Medical History: Past Medical History:  Diagnosis Date  . Atrial fibrillation ([place])    a. diagnosed on 10/20/2016; chads2vasc => at least 2 (HTN, sex category), possibly 3 given history of gestational diabetes  . Endometriosis   . GERD (gastroesophageal reflux disease)   . Gestational diabetes   . Hypertension   . Menorrhagia   . Migraine   . Obesity     Medications:  Scheduled:  . potassium chloride  30 mEq Oral BID  . sodium chloride flush  3 mL Intravenous Q12H  . sodium chloride flush  3 mL Intravenous Q12H   Infusions:  . sodium chloride    . heparin 1,150 Units/hr (10/20/16 1148)    Assessment: Heparin drip ordered for a-fib in this 31 yo female who presents to the ED with chest pain.  Goal of Therapy:  Heparin level 0.3-0.7 units/ml Monitor platelets by anticoagulation protocol: Yes   Plan:  Give 4000 units bolus x 1 Start heparin infusion at 1150 units/hr Check anti-Xa level in 6 hours and daily while on heparin Continue to monitor H&H and platelets   HL ordered for 5/14 at 18:00.   Olivia Canter, [place] [facility] 10/20/2016,1:32 PM

## 2016-10-20 NOTE — H&P (Signed)
[facility] [place] NAME: Debra Whitney    MR#:  [id number]  DATE OF BIRTH:  08-21-1985  DATE OF ADMISSION:  10/20/2016  PRIMARY CARE PHYSICIAN: Burnard Hawthorne, FNP   REQUESTING/REFERRING PHYSICIAN: Dr. Corky Downs  CHIEF COMPLAINT:   Chief Complaint  Patient presents with  . Chest Pain    HISTORY OF PRESENT ILLNESS:  Debra Whitney  is a 31 y.o. female with a known history of Untreated hypertension, migraine, GERD presents to the emergency room complaining of 2 days of not feeling well, shortness of breath, chest pain, palpitations. Patient was on hydrochlorothiazide and metoprolol in the past. She out of her medications in December. Has not seen a doctor since then. In December she had an early miscarriage. She has been trying to change her lifestyle and lost 1 pound. Does not watch her salt. Drinks one cup of coffee a day. She did have 4 beers on Saturday when her symptoms started. Here in the emergency room patient has been found to have atrial fibrillation with heart rate in the 150s and no response to IV metoprolol bolus.  Her heart rate is still in the 130s.  PAST MEDICAL HISTORY:   Past Medical History:  Diagnosis Date  . GERD (gastroesophageal reflux disease)   . Headache    HEADACHES EVERY DAY  . Hypertension   . Migraine     PAST SURGICAL HISTORY:   Past Surgical History:  Procedure Laterality Date  . DILATION AND CURETTAGE OF UTERUS    . endometrial surgery    . HERNIA REPAIR    . HYSTEROSCOPY W/D&C  11/29/2015   Procedure: DILATATION AND CURETTAGE /HYSTEROSCOPY;  Surgeon: Malachy Mood, MD;  Location: [facility] ORS;  Service: Gynecology;;  . LAPAROSCOPY N/A 11/29/2015   Procedure: LAPAROSCOPY DIAGNOSTIC;  Surgeon: Malachy Mood, MD;  Location: [facility] ORS;  Service: Gynecology;  Laterality: N/A;  . NASAL SINUS SURGERY    . WISDOM TOOTH EXTRACTION      SOCIAL HISTORY:   Social History  Substance Use Topics  .  Smoking status: Never Smoker  . Smokeless tobacco: Never Used  . Alcohol use No    FAMILY HISTORY:   Family History  Problem Relation Age of Onset  . Diabetes Mother   . Hypertension Mother   . CVA Maternal Grandmother     DRUG ALLERGIES:   Allergies  Allergen Reactions  . Percocet [Oxycodone-Acetaminophen] Nausea And Vomiting    REVIEW OF SYSTEMS:   Review of Systems  Constitutional: Positive for malaise/fatigue. Negative for chills and fever.  HENT: Negative for sore throat.   Eyes: Negative for blurred vision, double vision and pain.  Respiratory: Positive for shortness of breath. Negative for cough, hemoptysis and wheezing.   Cardiovascular: Positive for chest pain and palpitations. Negative for orthopnea and leg swelling.  Gastrointestinal: Negative for abdominal pain, constipation, diarrhea, heartburn, nausea and vomiting.  Genitourinary: Negative for dysuria and hematuria.  Musculoskeletal: Negative for back pain and joint pain.  Skin: Negative for rash.  Neurological: Positive for weakness. Negative for sensory change, speech change, focal weakness and headaches.  Endo/Heme/Allergies: Does not bruise/bleed easily.  Psychiatric/Behavioral: Negative for depression. The patient is not nervous/anxious.     MEDICATIONS AT HOME:   Prior to Admission medications   Medication Sig Start Date End Date Taking? Authorizing Provider  loratadine (CLARITIN) 10 MG tablet Take 10 mg by mouth daily.   Yes [provider]  omeprazole (PRILOSEC) 20 MG capsule Take  40 mg by mouth every morning.    Yes [provider]  ondansetron (ZOFRAN) 4 MG tablet Take 4 mg by mouth every 8 (eight) hours as needed for nausea or vomiting.   Yes [provider]     VITAL SIGNS:  Blood pressure (!) 126/99, pulse (!) 39, temperature 98.3 F (36.8 C), temperature source Oral, resp. rate (!) 22, height 5\' 1"  (1.549 m), weight 81.6 kg (180 lb), SpO2 97 %, unknown if  currently breastfeeding.  PHYSICAL EXAMINATION:  Physical Exam  GENERAL:  31 y.o.-year-old patient lying in the bed with no acute distress.  EYES: Pupils equal, round, reactive to light and accommodation. No scleral icterus. Extraocular muscles intact.  HEENT: Head atraumatic, normocephalic. Oropharynx and nasopharynx clear. No oropharyngeal erythema, moist oral mucosa  NECK:  Supple, no jugular venous distention. No thyroid enlargement, no tenderness.  LUNGS: Normal breath sounds bilaterally, no wheezing, rales, rhonchi. No use of accessory muscles of respiration.  CARDIOVASCULAR: S1, S2 Irregular. Tachycardia. ABDOMEN: Soft, nontender, nondistended. Bowel sounds present. No organomegaly or mass.  EXTREMITIES: No pedal edema, cyanosis, or clubbing. + 2 pedal & radial pulses b/l.   NEUROLOGIC: Cranial nerves II through XII are intact. No focal Motor or sensory deficits appreciated b/l PSYCHIATRIC: The patient is alert and oriented x 3. Good affect.  SKIN: No obvious rash, lesion, or ulcer.   LABORATORY PANEL:   CBC  Recent Labs Lab 10/20/16 0853  WBC 9.0  HGB 13.3  HCT 40.0  PLT 396   ------------------------------------------------------------------------------------------------------------------  Chemistries   Recent Labs Lab 10/20/16 0853  NA 140  K 3.1*  CL 103  CO2 28  GLUCOSE 99  BUN 11  CREATININE 0.70  CALCIUM 9.1  AST 25  ALT 27  ALKPHOS 51  BILITOT 0.5   ------------------------------------------------------------------------------------------------------------------  Cardiac Enzymes  Recent Labs Lab 10/20/16 0853  TROPONINI <0.03   ------------------------------------------------------------------------------------------------------------------  RADIOLOGY:  Dg Chest 1 View  Result Date: 10/20/2016 CLINICAL DATA:  Tachycardia.  Chest pressure. EXAM: CHEST 1 VIEW COMPARISON:  No recent prior . FINDINGS: Mediastinum and hilar structures normal.  Lungs are clear. No pleural effusion or pneumothorax. Cardiomegaly with normal pulmonary vascularity. IMPRESSION: 1. Cardiomegaly with normal pulmonary vascularity. 2. No acute pulmonary disease . Electronically Signed   By: Marcello Moores  Register   On: 10/20/2016 09:14     IMPRESSION AND PLAN:   * New onset Afib  She has likely had this for few months now. Start esmolol drip. Heparin for anti coagulation Echo Replace potassium. Start cardiology team Telemetry monitoring May need cardioversion depending on response to a small all and echocardiogram results.  * Hypertension Presently on esmolol drip. Can be transitioned to metoprolol. Low-salt diet  * Hypokalemia. Replace orally.  DVT prophylaxis. On heparin drip.  All the records are reviewed and case discussed with ED provider. Management plans discussed with the patient, family and they are in agreement.  CODE STATUS: FULL CODE  TOTAL CC TIME TAKING CARE OF THIS PATIENT: 40 minutes.   Hillary Bow R M.D on 10/20/2016 at 11:06 AM  Between 7am to 6pm - Pager - [phone]  After 6pm go to [email or web address] - password [email or web address] [facility]  Office  [phone]  CC: Primary care physician; Burnard Hawthorne, FNP  Note: This dictation was prepared with Dragon dictation along with smaller phrase technology. Any transcriptional errors that result from this process are unintentional.

## 2016-10-20 NOTE — ED Notes (Signed)
Pt given drink at this time, pt doesn't want any snack or sandwich at this time

## 2016-10-20 NOTE — Progress Notes (Addendum)
ANTICOAGULATION CONSULT NOTE - Initial Consult  Pharmacy Consult for Heparin Drip Indication: atrial fibrillation  Allergies  Allergen Reactions  . Percocet [Oxycodone-Acetaminophen] Nausea And Vomiting    Patient Measurements: Height: 5\' 1"  (154.9 cm) Weight: 180 lb (81.6 kg) IBW/kg (Calculated) : 47.8 Heparin Dosing Weight: 66.3 kg  Vital Signs: Temp: 98.2 F (36.8 C) (05/14 2005) Temp Source: Oral (05/14 2005) BP: 107/82 (05/14 2005) Pulse Rate: 75 (05/14 2005)  Labs:  Recent Labs  10/20/16 0853 10/20/16 1929  HGB 13.3  --   HCT 40.0  --   PLT 396  --   APTT 24  --   LABPROT 12.7  --   INR 0.95  --   HEPARINUNFRC  --  0.64  CREATININE 0.70  --   TROPONINI <0.03  --     Estimated Creatinine Clearance: 99.5 mL/min (by C-G formula based on SCr of 0.7 mg/dL).   Medical History: Past Medical History:  Diagnosis Date  . Atrial fibrillation ([place])    a. diagnosed on 10/20/2016; chads2vasc => at least 2 (HTN, sex category), possibly 3 given history of gestational diabetes  . Endometriosis   . GERD (gastroesophageal reflux disease)   . Gestational diabetes   . Hypertension   . Menorrhagia   . Migraine   . Obesity     Medications:  Scheduled:  . metoprolol tartrate  25 mg Oral Q6H  . off the beat book   Does not apply Once  . potassium chloride  30 mEq Oral BID  . sodium chloride flush  3 mL Intravenous Q12H  . sodium chloride flush  3 mL Intravenous Q12H   Infusions:  . sodium chloride    . diltiazem (CARDIZEM) infusion 12.5 mg/hr (10/20/16 2000)  . heparin 1,150 Units/hr (10/20/16 1148)    Assessment: Heparin drip ordered for a-fib in this 31 yo female who presents to the ED with chest pain.  Goal of Therapy:  Heparin level 0.3-0.7 units/ml Monitor platelets by anticoagulation protocol: Yes   Plan:  Give 4000 units bolus x 1 Start heparin infusion at 1150 units/hr Check anti-Xa level in 6 hours and daily while on heparin Continue to monitor  H&H and platelets   HL ordered for 5/14 at 18:00.   5/14: HL @ 19:29 = 0.64.   Will continue this pt on current rate and draw confirmation level on 5/15 @ 0130.   5/15 01:30 heparin level 0.70. Continue current regimen. Recheck heparin level and CBC with tomorrow AM labs.  Sim Boast, PharmD, BCPS  10/21/16 3:24 AM

## 2016-10-21 ENCOUNTER — Telehealth: Payer: Self-pay | Admitting: Family

## 2016-10-21 ENCOUNTER — Telehealth: Payer: Self-pay | Admitting: *Deleted

## 2016-10-21 DIAGNOSIS — R002 Palpitations: Secondary | ICD-10-CM | POA: Diagnosis not present

## 2016-10-21 DIAGNOSIS — R112 Nausea with vomiting, unspecified: Secondary | ICD-10-CM | POA: Diagnosis not present

## 2016-10-21 DIAGNOSIS — I4891 Unspecified atrial fibrillation: Principal | ICD-10-CM

## 2016-10-21 DIAGNOSIS — Z6833 Body mass index (BMI) 33.0-33.9, adult: Secondary | ICD-10-CM | POA: Diagnosis not present

## 2016-10-21 DIAGNOSIS — I10 Essential (primary) hypertension: Secondary | ICD-10-CM | POA: Diagnosis not present

## 2016-10-21 DIAGNOSIS — R0602 Shortness of breath: Secondary | ICD-10-CM | POA: Diagnosis not present

## 2016-10-21 DIAGNOSIS — E876 Hypokalemia: Secondary | ICD-10-CM | POA: Diagnosis not present

## 2016-10-21 DIAGNOSIS — E669 Obesity, unspecified: Secondary | ICD-10-CM | POA: Diagnosis not present

## 2016-10-21 DIAGNOSIS — I119 Hypertensive heart disease without heart failure: Secondary | ICD-10-CM | POA: Diagnosis not present

## 2016-10-21 DIAGNOSIS — K219 Gastro-esophageal reflux disease without esophagitis: Secondary | ICD-10-CM | POA: Diagnosis not present

## 2016-10-21 LAB — CBC
HEMATOCRIT: 35.3 % (ref 35.0–47.0)
Hemoglobin: 11.6 g/dL — ABNORMAL LOW (ref 12.0–16.0)
MCH: 27.5 pg (ref 26.0–34.0)
MCHC: 32.8 g/dL (ref 32.0–36.0)
MCV: 83.8 fL (ref 80.0–100.0)
PLATELETS: 330 10*3/uL (ref 150–440)
RBC: 4.21 MIL/uL (ref 3.80–5.20)
RDW: 15.4 % — AB (ref 11.5–14.5)
WBC: 9.7 10*3/uL (ref 3.6–11.0)

## 2016-10-21 LAB — BASIC METABOLIC PANEL
ANION GAP: 6 (ref 5–15)
BUN: 8 mg/dL (ref 6–20)
CO2: 27 mmol/L (ref 22–32)
Calcium: 8.9 mg/dL (ref 8.9–10.3)
Chloride: 105 mmol/L (ref 101–111)
Creatinine, Ser: 0.54 mg/dL (ref 0.44–1.00)
GFR calc Af Amer: >60 mL/min (ref >60)
Glucose, Bld: 91 mg/dL (ref 65–99)
POTASSIUM: 4.2 mmol/L (ref 3.5–5.1)
Sodium: 138 mmol/L (ref 135–145)

## 2016-10-21 LAB — LIPID PANEL
CHOL/HDL RATIO: 3.6 ratio
CHOLESTEROL: 147 mg/dL (ref 0–200)
HDL: 41 mg/dL (ref >40)
LDL Cholesterol: 70 mg/dL (ref 0–99)
Triglycerides: 178 mg/dL — ABNORMAL HIGH (ref <150)
VLDL: 36 mg/dL (ref 0–40)

## 2016-10-21 LAB — HEPARIN LEVEL (UNFRACTIONATED): Heparin Unfractionated: 0.7 IU/mL (ref 0.30–0.70)

## 2016-10-21 LAB — ECHOCARDIOGRAM COMPLETE
Height: 61 in
WEIGHTICAEL: 2880 [oz_av]

## 2016-10-21 LAB — HIV ANTIBODY (ROUTINE TESTING W REFLEX): HIV SCREEN 4TH GENERATION: NONREACTIVE

## 2016-10-21 LAB — HEMOGLOBIN A1C
Hgb A1c MFr Bld: 5.7 % — ABNORMAL HIGH (ref 4.8–5.6)
Mean Plasma Glucose: 117 mg/dL

## 2016-10-21 MED ORDER — APIXABAN 5 MG PO TABS
5.0000 mg | ORAL_TABLET | Freq: Two times a day (BID) | ORAL | Status: DC
  Administered 2016-10-21: 5 mg via ORAL
  Filled 2016-10-21: qty 1

## 2016-10-21 MED ORDER — METOPROLOL TARTRATE 25 MG PO TABS: 25.0000 mg | ORAL_TABLET | Freq: Two times a day (BID) | ORAL | 0 refills | Status: DC

## 2016-10-21 MED ORDER — APIXABAN 5 MG PO TABS: 5.0000 mg | ORAL_TABLET | Freq: Two times a day (BID) | ORAL | 0 refills | Status: DC

## 2016-10-21 MED ORDER — METOPROLOL TARTRATE 25 MG PO TABS
25.0000 mg | ORAL_TABLET | Freq: Two times a day (BID) | ORAL | Status: DC
  Filled 2016-10-21: qty 1

## 2016-10-21 NOTE — Telephone Encounter (Signed)
-----   Message from Blain Pais sent at 10/21/2016 11:01 AM EDT ----- Regarding: tcm/ph 6/5 2:40 Dr. Fletcher Anon

## 2016-10-21 NOTE — Telephone Encounter (Signed)
Patient contacted regarding discharge from [facility] on 10/21/16.  Patient understands to follow up with provider Dr. Fletcher Anon on 11/11/16 at 2:40 PM at [facility]. Patient understands discharge instructions? Yes Patient understands medications and regiment? Yes Patient understands to bring all medications to this visit? Yes  Spoke with patient and she states that she would like to go back to work on Thursday. 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 She was appreciative for the call and had no further questions at this time.

## 2016-10-21 NOTE — Telephone Encounter (Signed)
HFU/ Pt is being discharged today from [facility]. Dx was RVR. Pt is scheduled to come in on 10/28/2016 @ 11:30am. Thank you!

## 2016-10-21 NOTE — Progress Notes (Signed)
Patient Name: Debra Whitney Date of Encounter: 10/21/2016  Primary Cardiologist: New to [facility] - consult by [facility] Problem List     Principal Problem:   Atrial fibrillation with RVR [facility]) Active Problems:   New onset atrial fibrillation ([place])   Hypokalemia   Vomiting   Essential hypertension   Gestational diabetes   Obesity     Subjective   Converted to sinus rhythm at 19:33 on 10/20/16 and has maintained sinsu rhythm since with heart rate in the 70s bpm. Potassium repleted to 4.2. TTE pending this morning. Feels much better. Has ambulated in the room without symptoms. Wants to go home. She is planning to retry for pregnancy later this year, though not now. No further chest pressure or SOB. No palpitations.   Inpatient Medications    Scheduled Meds: . apixaban  5 mg Oral BID  . metoprolol tartrate  25 mg Oral Q6H  . potassium chloride  30 mEq Oral BID  . sodium chloride flush  3 mL Intravenous Q12H  . sodium chloride flush  3 mL Intravenous Q12H   Continuous Infusions: . sodium chloride     PRN Meds: sodium chloride, acetaminophen **OR** acetaminophen, ondansetron **OR** ondansetron (ZOFRAN) IV, polyethylene glycol, sodium chloride flush   Vital Signs    Vitals:   10/20/16 2005 10/20/16 2317 10/21/16 0551 10/21/16 0552  BP: 107/82 105/66 (!) 137/111 110/60  Pulse: 75 69 80 77  Resp: 17  16   Temp: 98.2 F (36.8 C) 98.2 F (36.8 C) 98 F (36.7 C)   TempSrc: Oral Oral Oral   SpO2: 100% 99% 100%   Weight:    176 lb 4.8 oz (80 kg)  Height:        Intake/Output Summary (Last 24 hours) at 10/21/16 0803 Last data filed at 10/20/16 2212  Gross per 24 hour  Intake                0 ml  Output              601 ml  Net             -601 ml   Filed Weights   10/20/16 0836 10/21/16 0552  Weight: 180 lb (81.6 kg) 176 lb 4.8 oz (80 kg)    Physical Exam    GEN: Well nourished, well developed, in no acute distress.  HEENT: Grossly normal.  Neck:  Supple, no JVD, carotid bruits, or masses. Cardiac: RRR, no murmurs, rubs, or gallops. No clubbing, cyanosis, edema.  Radials/DP/PT 2+ and equal bilaterally.  Respiratory:  Respirations regular and unlabored, clear to auscultation bilaterally. GI: Soft, nontender, nondistended, BS + x 4. MS: no deformity or atrophy. Skin: warm and dry, no rash. Neuro:  Strength and sensation are intact. Psych: AAOx3.  Normal affect.  Labs    CBC  Recent Labs  10/20/16 0853 10/21/16 0137  WBC 9.0 9.7  HGB 13.3 11.6*  HCT 40.0 35.3  MCV 82.5 83.8  PLT 396 497   Basic Metabolic Panel  Recent Labs  10/20/16 0853 10/20/16 1929  NA 140  --   K 3.1* 3.7  CL 103  --   CO2 28  --   GLUCOSE 99  --   BUN 11  --   CREATININE 0.70  --   CALCIUM 9.1  --   MG 1.9  --    Liver Function Tests  Recent Labs  10/20/16 0853  AST 25  ALT 27  ALKPHOS 51  BILITOT 0.5  PROT 8.1  ALBUMIN 4.6   No results for input(s): LIPASE, AMYLASE in the last 72 hours. Cardiac Enzymes  Recent Labs  10/20/16 0853  TROPONINI <0.03   BNP Invalid input(s): POCBNP D-Dimer No results for input(s): DDIMER in the last 72 hours. Hemoglobin A1C  Recent Labs  10/20/16 0853  HGBA1C 5.7*   Fasting Lipid Panel  Recent Labs  10/21/16 0137  CHOL 147  HDL 41  LDLCALC 70  TRIG 178*  CHOLHDL 3.6   Thyroid Function Tests  Recent Labs  10/20/16 0853  TSH 2.011    Telemetry    Converted to sinus rhythm with heart rate in the 70s bpm at 19:33 and has maintained sinus rhythm since - Personally Reviewed  ECG    n/a - Personally Reviewed  Radiology    Dg Chest 1 View  Result Date: 10/20/2016 CLINICAL DATA:  Tachycardia.  Chest pressure. EXAM: CHEST 1 VIEW COMPARISON:  No recent prior . FINDINGS: Mediastinum and hilar structures normal. Lungs are clear. No pleural effusion or pneumothorax. Cardiomegaly with normal pulmonary vascularity. IMPRESSION: 1. Cardiomegaly with normal pulmonary vascularity.  2. No acute pulmonary disease . Electronically Signed   By: Marcello Moores  Register   On: 10/20/2016 09:14    Cardiac Studies   TTE pending  Patient Profile     31 y.o. female with history of essential hypertension, headache disorder, menorrhagia s/p prior D&C, endometriosis, GERD, SAB, and obesity who presented to [facility] with palpitations, chest pressure, and SOB. She was found to be in new onset Afib with RVR with heart rates in the 170s bpm in the setting of nausea and vomiting 2/2 alcohol leading to hypokalemia.   Assessment & Plan    1. New onset Afib with RVR: -Converted to sinus rhythm at 19:33 and has maintained sinus rhythm with a heart rate in the 70s bpm -Hypokalemia repleted to goal this morning at 4.2 -Discontinue diltiazem gtt -Continue Lopressor 25 mg bid (hold for SBP < 100 mmHg) -Given her elevated CHADS2VASc of 2 (HTN and sex category) will start on Eliquis 5 mg bid. She is not planning to get pregnant at this time at the request of her OB given her recent SAB. She has been advised to use contraception and notify us if she plans to get pregnant in the future. Risks and benefits were discussed in detail and she agrees to start therapy -TTE pending, preliminary report with preserved EF  2. Hypokalemia: -Resolved -Likely in the setting of her vomiting 2/2 alcohol use -Can stop PO KCl   3. Nausea and vomiting: -Likely in the setting of increased alcohol abuse on 5/12 as the patient does not drink alcohol regularly  -Improved  4. HTN: -Previously on HCTZ and metoprolol though HCTZ was stopped several months back 2/2 AKI and metoprolol was discontinued 2/2 uncertain reasons by the patient -Currently well controlled -Continue Lopressor as above  5. Gestational diabetes: -Hgb A1c5.7% -Follow up with PCP  6. Obesity: -Weight loss advised -May benefit from outpatient sleep study if she has recurrent Afib   Signed, Christell Faith, PA-C [place] Pager: [phone] 10/21/2016, 8:03 AM

## 2016-10-21 NOTE — Plan of Care (Signed)
Problem: Cardiac: Goal: Ability to achieve and maintain adequate cardiopulmonary perfusion will improve Outcome: Progressing SR maintained since 1939.  No complaints of palpitations of shortness of breath.

## 2016-10-21 NOTE — Progress Notes (Signed)
Patient given discharge teaching and paperwork regarding medications, diet, follow-up appointments and activity. Patient understanding verbalized. No complaints at this time. IV and telemetry discontinued prior to leaving. Skin assessment as previously charted and vitals are stable; on room air. Patient being discharged to home. Caregiver/family present during discharge teaching. No further needs by Care Management. Prescriptions sent to pharmacy.

## 2016-10-21 NOTE — Care Management (Signed)
Placed in observation for new onset atrial fib requiring esmolol continuous infusion.  Cardiology is consulting.  Independent in all adls, denies issues accessing medical care, obtaining medications or with transportation.  Has not followed up with her pcp for "sometime."  has an appointment scheduled with her pcp.  Discussed taking blood pressure medication regularly.  Patient to discharge home on Eliquis.  Patient verbally confirms she has pharmacy coverage with her insurance.  Provided her with 30 day trial offer coupon and copay coupons. Uses [facility] pharmacy

## 2016-10-21 NOTE — Discharge Summary (Signed)
[facility] NAME: Debra Whitney    MR#:  [id number]  DATE OF BIRTH:  November 26, 1985  DATE OF ADMISSION:  10/20/2016 ADMITTING PHYSICIAN: Hillary Bow, MD  DATE OF DISCHARGE: 10/21/2016  PRIMARY CARE PHYSICIAN: Burnard Hawthorne, FNP    ADMISSION DIAGNOSIS:  Atrial fibrillation with rapid ventricular response ([place]) [I48.91] A-fib ([place]) [I48.91]  DISCHARGE DIAGNOSIS:  Principal Problem:   Atrial fibrillation with RVR ([place]) Active Problems:   New onset atrial fibrillation (HCC)   Hypokalemia   Essential hypertension   Gestational diabetes   Obesity   Vomiting   SECONDARY DIAGNOSIS:   Past Medical History:  Diagnosis Date  . Atrial fibrillation ([place])    a. diagnosed on 10/20/2016; chads2vasc => at least 2 (HTN, sex category), possibly 3 given history of gestational diabetes  . Endometriosis   . GERD (gastroesophageal reflux disease)   . Gestational diabetes   . Hypertension   . Menorrhagia   . Migraine   . Obesity     HOSPITAL COURSE:   31 year old female with history of essential hypertension not on medications for the past 6 months who presented with new onset atrial fibrillation with RVR.  1. New onset atrial fibrillation with RVR: Patient has converted to normal sinus rhythm. Patient was evaluated by cardiology with recommendations to start Lopressor 25 mg by mouth twice a day in addition to anticoagulation with Eliquis 5 mg by mouth twice a day. Side effects, alternatives, risks and benefits were discussed with the patient. She was advised to use contraception and notify cardiology if she plans to get pregnant in the future.  Echocardiogram shows normal ejection fraction and no valvular abnormalities. TSH was within normal limits 2. Essential hypertension: Patient will continue metoprolol. Cardiology and PCP.  3. Nausea and vomiting in the setting of increased EtOH abuse: This is resolved  4. Hypokalemia: This  is resolved  5. Gestational diabetes with A1c of 5.7: Patient will need a follow-up with PCP DISCHARGE CONDITIONS AND DIET:   Stable for discharge on cardiac diet  CONSULTS OBTAINED:  Treatment Team:  Wellington Hampshire, MD  DRUG ALLERGIES:   Allergies  Allergen Reactions  . Percocet [Oxycodone-Acetaminophen] Nausea And Vomiting    DISCHARGE MEDICATIONS:   Current Discharge Medication List    START taking these medications   Details  apixaban (ELIQUIS) 5 MG TABS tablet Take 1 tablet (5 mg total) by mouth 2 (two) times daily. Qty: 60 tablet, Refills: 0    metoprolol tartrate (LOPRESSOR) 25 MG tablet Take 1 tablet (25 mg total) by mouth 2 (two) times daily. Qty: 60 tablet, Refills: 0      CONTINUE these medications which have NOT CHANGED   Details  loratadine (CLARITIN) 10 MG tablet Take 10 mg by mouth daily.    omeprazole (PRILOSEC) 20 MG capsule Take 40 mg by mouth every morning.     ondansetron (ZOFRAN) 4 MG tablet Take 4 mg by mouth every 8 (eight) hours as needed for nausea or vomiting.          Today   CHIEF COMPLAINT:   No acute events overnight   VITAL SIGNS:  Blood pressure (!) 97/55, pulse 70, temperature 98 F (36.7 C), temperature source Oral, resp. rate 16, height 5\' 1"  (1.549 m), weight 80 kg (176 lb 4.8 oz), SpO2 100 %, unknown if currently breastfeeding.   REVIEW OF SYSTEMS:  Review of Systems  Constitutional: Negative.  Negative for chills, fever and malaise/fatigue.  HENT: Negative.  Negative for ear discharge, ear pain, hearing loss, nosebleeds and sore throat.   Eyes: Negative.  Negative for blurred vision and pain.  Respiratory: Negative.  Negative for cough, hemoptysis, shortness of breath and wheezing.   Cardiovascular: Negative.  Negative for chest pain, palpitations and leg swelling.  Gastrointestinal: Negative.  Negative for abdominal pain, blood in stool, diarrhea, nausea and vomiting.  Genitourinary: Negative.  Negative for  dysuria.  Musculoskeletal: Negative.  Negative for back pain.  Skin: Negative.   Neurological: Negative for dizziness, tremors, speech change, focal weakness, seizures and headaches.  Endo/Heme/Allergies: Negative.  Does not bruise/bleed easily.  Psychiatric/Behavioral: Negative.  Negative for depression, hallucinations and suicidal ideas.     PHYSICAL EXAMINATION:   GENERAL:  31 y.o.-year-old patient lying in the bed with no acute distress.  NECK:  Supple, no jugular venous distention. No thyroid enlargement, no tenderness.  LUNGS: Normal breath sounds bilaterally, no wheezing, rales,rhonchi  No use of accessory muscles of respiration.  CARDIOVASCULAR: S1, S2 normal. No murmurs, rubs, or gallops.  ABDOMEN: Soft, non-tender, non-distended. Bowel sounds present. No organomegaly or mass.  EXTREMITIES: No pedal edema, cyanosis, or clubbing.  PSYCHIATRIC: The patient is alert and oriented x 3.  SKIN: No obvious rash, lesion, or ulcer.   DATA REVIEW:   CBC  Recent Labs Lab 10/21/16 0137  WBC 9.7  HGB 11.6*  HCT 35.3  PLT 330    Chemistries   Recent Labs Lab 10/20/16 0853  10/21/16 0137  NA 140  --  138  K 3.1*  < > 4.2  CL 103  --  105  CO2 28  --  27  GLUCOSE 99  --  91  BUN 11  --  8  CREATININE 0.70  --  0.54  CALCIUM 9.1  --  8.9  MG 1.9  --   --   AST 25  --   --   ALT 27  --   --   ALKPHOS 51  --   --   BILITOT 0.5  --   --   < > = values in this interval not displayed.  Cardiac Enzymes  Recent Labs Lab 10/20/16 0853  TROPONINI <0.03    Microbiology Results  @MICRORSLT48 @  RADIOLOGY:  Dg Chest 1 View  Result Date: 10/20/2016 CLINICAL DATA:  Tachycardia.  Chest pressure. EXAM: CHEST 1 VIEW COMPARISON:  No recent prior . FINDINGS: Mediastinum and hilar structures normal. Lungs are clear. No pleural effusion or pneumothorax. Cardiomegaly with normal pulmonary vascularity. IMPRESSION: 1. Cardiomegaly with normal pulmonary vascularity. 2. No acute  pulmonary disease . Electronically Signed   By: Marcello Moores  Register   On: 10/20/2016 09:14      Current Discharge Medication List    START taking these medications   Details  apixaban (ELIQUIS) 5 MG TABS tablet Take 1 tablet (5 mg total) by mouth 2 (two) times daily. Qty: 60 tablet, Refills: 0    metoprolol tartrate (LOPRESSOR) 25 MG tablet Take 1 tablet (25 mg total) by mouth 2 (two) times daily. Qty: 60 tablet, Refills: 0      CONTINUE these medications which have NOT CHANGED   Details  loratadine (CLARITIN) 10 MG tablet Take 10 mg by mouth daily.    omeprazole (PRILOSEC) 20 MG capsule Take 40 mg by mouth every morning.     ondansetron (ZOFRAN) 4 MG tablet Take 4 mg by mouth every 8 (eight) hours as needed for nausea or vomiting.  Management plans discussed with the patient and she is in agreement. Stable for discharge home  Patient should follow up with pcp  CODE STATUS:     Code Status Orders        Start     Ordered   10/20/16 1758  Full code  Continuous     10/20/16 1757    Code Status History    Date Active Date Inactive Code Status Order ID Comments User Context   10/20/2016 11:05 AM 10/20/2016  5:57 PM Full Code [id number]  Hillary Bow, MD ED      TOTAL TIME TAKING CARE OF THIS PATIENT: 37 minutes.    Note: This dictation was prepared with Dragon dictation along with smaller phrase technology. Any transcriptional errors that result from this process are unintentional.  Shakur Lembo M.D on 10/21/2016 at 10:43 AM  Between 7am to 6pm - Pager - [phone] After 6pm go to [email or web address] - password [email or web address] [facility]  Office  [phone]  CC: Primary care physician; Burnard Hawthorne, FNP

## 2016-10-22 NOTE — Progress Notes (Deleted)
Subjective:    Patient ID: Debra Whitney, female    DOB: 1985/09/07, 31 y.o.   MRN: [id number]  CC: [name] is a 31 y.o. female who presents today for follow up.   HPI: HPI  5/14 admitted for a. Fib RVR. Discharged on 5/15. Contacted by our office 5/15. Converted to NSR. Cardiology started lopressor and eliquis. Echo showed normal EF.  Alcohol abuse?  Gestational DM  HISTORY:  Past Medical History:  Diagnosis Date  . Atrial fibrillation ([place])    a. diagnosed on 10/20/2016; chads2vasc => at least 2 (HTN, sex category), possibly 3 given history of gestational diabetes  . Endometriosis   . GERD (gastroesophageal reflux disease)   . Gestational diabetes   . Hypertension   . Menorrhagia   . Migraine   . Obesity    Past Surgical History:  Procedure Laterality Date  . DILATION AND CURETTAGE OF UTERUS    . endometrial surgery    . HERNIA REPAIR    . HYSTEROSCOPY W/D&C  11/29/2015   Procedure: DILATATION AND CURETTAGE /HYSTEROSCOPY;  Surgeon: Malachy Mood, MD;  Location: [facility] ORS;  Service: Gynecology;;  . LAPAROSCOPY N/A 11/29/2015   Procedure: LAPAROSCOPY DIAGNOSTIC;  Surgeon: Malachy Mood, MD;  Location: [facility] ORS;  Service: Gynecology;  Laterality: N/A;  . NASAL SINUS SURGERY    . WISDOM TOOTH EXTRACTION     Family History  Problem Relation Age of Onset  . Diabetes Mother   . Hypertension Mother   . CVA Maternal Grandmother     Allergies: Percocet [oxycodone-acetaminophen] Current Outpatient Prescriptions on File Prior to Visit  Medication Sig Dispense Refill  . apixaban (ELIQUIS) 5 MG TABS tablet Take 1 tablet (5 mg total) by mouth 2 (two) times daily. 60 tablet 0  . loratadine (CLARITIN) 10 MG tablet Take 10 mg by mouth daily.    . metoprolol tartrate (LOPRESSOR) 25 MG tablet Take 1 tablet (25 mg total) by mouth 2 (two) times daily. 60 tablet 0  . omeprazole (PRILOSEC) 20 MG capsule Take 40 mg by mouth every morning.     . ondansetron (ZOFRAN) 4 MG  tablet Take 4 mg by mouth every 8 (eight) hours as needed for nausea or vomiting.     No current facility-administered medications on file prior to visit.     Social History  Substance Use Topics  . Smoking status: Never Smoker  . Smokeless tobacco: Never Used  . Alcohol use No    Review of Systems    Objective:    There were no vitals taken for this visit. BP Readings from Last 3 Encounters:  10/21/16 (!) 97/55  08/25/16 136/88  08/21/16 (!) 143/83   Wt Readings from Last 3 Encounters:  10/21/16 176 lb 4.8 oz (80 kg)  09/23/16 179 lb 5 oz (81.3 kg)  08/25/16 179 lb 8 oz (81.4 kg)    Physical Exam     Assessment & Plan:   Problem List Items Addressed This Visit    None       I am having Ms. Zeiser maintain her omeprazole, loratadine, ondansetron, metoprolol tartrate, and apixaban.   No orders of the defined types were placed in this encounter.   Return precautions given.   Risks, benefits, and alternatives of the medications and treatment plan prescribed today were discussed, and patient expressed understanding.   Education regarding symptom management and diagnosis given to patient on AVS.  Continue to follow with Burnard Hawthorne, FNP for routine health  maintenance.   Debra Whitney and I agreed with plan.   Mable Paris, FNP

## 2016-10-22 NOTE — Telephone Encounter (Signed)
Yes, that is ok. Please provide a note if needed. Thanks!

## 2016-10-22 NOTE — Telephone Encounter (Signed)
Spoke with patient and reviewed with her that it is ok to return to work on Thursday. She verbalized understanding with no further questions at this time. Instructed her to call back if we can assist in anyway.

## 2016-10-22 NOTE — Telephone Encounter (Signed)
First attempt made for TCM left message for patient to call office. 

## 2016-10-22 NOTE — Telephone Encounter (Signed)
Left voicemail message to call back  

## 2016-10-22 NOTE — Telephone Encounter (Signed)
Transition Care Management Follow-up Telephone Call  How have you been since you were released from the hospital? Patient stated she feels much better that she still feels weak though..   Do you understand why you were in the hospital? Yes   Do you understand the discharge instrcutions?Yes  Items Reviewed:  Medications reviewed: Yes  Allergies reviewed: yes  Dietary changes reviewed: yes, heart healthy diet.  Referrals reviewed:yes   Functional Questionnaire:   Activities of Daily Living (ADLs):   She states they are independent in the following: Independent in ADLs States they require assistance with the following:No assist  Needed.   Any transportation issues/concerns?: No   Any patient concerns? No.  Confirmed importance and date/time of follow-up visits scheduled: yes   Confirmed with patient if condition begins to worsen call PCP or go to the ER.  Patient was given the Call-a-Nurse line [phone]: Yes.

## 2016-10-22 NOTE — Progress Notes (Signed)
Subjective:    Patient ID: Debra Whitney, female    DOB: November 11, 1985, 31 y.o.   MRN: [id number]  CC: Debra Whitney is a 31 y.o. female who presents today for follow up.   HPI: Feeling well today.   Afib rvr converted ; suspects because she ran out of her blood pressure medication for 6 months.   Admit 5/15, dc 5/15. Called from our office 5/16 At the time had drank 4 margaritas prior to going to ED. Doesn't normally drink alcohol.  Started on metoprolol, eliquis  Following with [facility]- June 5th.   HTN- h/o. Had been on hctz, metoprolol, lisinopril in the past.   H/o proteinuria, ketonuria. Normal renal US per patient 2017.   Follows with [facility]. Pap UTD 2018 per patient.   Depression- more tearful since miscarriage. Had been on celexa in past. No thoughts of hurting  Herself or anyone else. Some anxiety about work/ taking care of family. Struggles to sleep.    4/15 echo LV EF normal  HISTORY:  Past Medical History:  Diagnosis Date  . Atrial fibrillation ([place])    a. diagnosed on 10/20/2016; chads2vasc => at least 2 (HTN, sex category), possibly 3 given history of gestational diabetes  . Endometriosis   . GERD (gastroesophageal reflux disease)   . Gestational diabetes   . Hypertension   . Menorrhagia   . Migraine   . Obesity    Past Surgical History:  Procedure Laterality Date  . DILATION AND CURETTAGE OF UTERUS    . endometrial surgery    . HERNIA REPAIR    . HYSTEROSCOPY W/D&C  11/29/2015   Procedure: DILATATION AND CURETTAGE /HYSTEROSCOPY;  Surgeon: Malachy Mood, MD;  Location: [facility] ORS;  Service: Gynecology;;  . LAPAROSCOPY N/A 11/29/2015   Procedure: LAPAROSCOPY DIAGNOSTIC;  Surgeon: Malachy Mood, MD;  Location: [facility] ORS;  Service: Gynecology;  Laterality: N/A;  . NASAL SINUS SURGERY    . WISDOM TOOTH EXTRACTION     Family History  Problem Relation Age of Onset  . Diabetes Mother   . Hypertension Mother   . CVA Maternal Grandmother      Allergies: Percocet [oxycodone-acetaminophen] Current Outpatient Prescriptions on File Prior to Visit  Medication Sig Dispense Refill  . loratadine (CLARITIN) 10 MG tablet Take 10 mg by mouth daily.    . ondansetron (ZOFRAN) 4 MG tablet Take 4 mg by mouth every 8 (eight) hours as needed for nausea or vomiting.     No current facility-administered medications on file prior to visit.     Social History  Substance Use Topics  . Smoking status: Never Smoker  . Smokeless tobacco: Never Used  . Alcohol use Yes     Comment: occasionally    Review of Systems  Constitutional: Negative for chills and fever.  Eyes: Negative for visual disturbance.  Respiratory: Negative for cough.   Cardiovascular: Negative for chest pain and palpitations.  Gastrointestinal: Negative for nausea and vomiting.  Neurological: Negative for headaches.  Psychiatric/Behavioral: Positive for sleep disturbance. Negative for suicidal ideas. The patient is nervous/anxious.       Objective:    BP 108/68   Pulse 82   Temp 98.1 F (36.7 C) (Oral)   Resp 14   Ht 5\' 1"  (1.549 m)   Wt 180 lb (81.6 kg)   SpO2 98%   BMI 34.01 kg/m  BP Readings from Last 3 Encounters:  10/27/16 108/68  10/21/16 (!) 97/55  08/25/16 136/88   Wt Readings from  Last 3 Encounters:  10/27/16 180 lb (81.6 kg)  10/21/16 176 lb 4.8 oz (80 kg)  09/23/16 179 lb 5 oz (81.3 kg)    Physical Exam  Constitutional: She appears well-developed and well-nourished.  Eyes: Conjunctivae are normal.  Cardiovascular: Normal rate, regular rhythm, normal heart sounds and normal pulses.   Pulmonary/Chest: Effort normal and breath sounds normal. She has no wheezes. She has no rhonchi. She has no rales.  Neurological: She is alert.  Skin: Skin is warm and dry.  Psychiatric: She has a normal mood and affect. Her speech is normal and behavior is normal. Thought content normal.  Vitals reviewed.      Assessment & Plan:   Problem List Items  Addressed This Visit      Cardiovascular and Mediastinum   Atrial fibrillation with RVR ([place]) - Primary    NSR. HR 82. On eliquis- unlikely to be long term.  Following with Fletcher Anon.       Relevant Medications   metoprolol tartrate (LOPRESSOR) 25 MG tablet   apixaban (ELIQUIS) 5 MG TABS tablet   Essential hypertension    Controlled. Will continue current regimen.       Relevant Medications   metoprolol tartrate (LOPRESSOR) 25 MG tablet   apixaban (ELIQUIS) 5 MG TABS tablet   Other Relevant Orders   Basic metabolic panel   Microalbumin / creatinine urine ratio     Other   Depression, recurrent (HCC)    Trial celexa; discontinued PPI due to drug interaction. Follow up 6-8 weeks.       Relevant Medications   citalopram (CELEXA) 10 MG tablet       I have discontinued Debra Whitney's omeprazole. I am also having her start on citalopram. Additionally, I am having her maintain her loratadine, ondansetron, metoprolol tartrate, and apixaban.   Meds ordered this encounter  Medications  . metoprolol tartrate (LOPRESSOR) 25 MG tablet    Sig: Take 1 tablet (25 mg total) by mouth 2 (two) times daily.    Dispense:  60 tablet    Refill:  5    Order Specific Question:   Supervising Provider    Answer:   Deborra Medina L [[id number]]  . apixaban (ELIQUIS) 5 MG TABS tablet    Sig: Take 1 tablet (5 mg total) by mouth 2 (two) times daily.    Dispense:  60 tablet    Refill:  3    Order Specific Question:   Supervising Provider    Answer:   Deborra Medina L [[id number]]  . citalopram (CELEXA) 10 MG tablet    Sig: Take 1 tablet (10 mg total) by mouth daily.    Dispense:  30 tablet    Refill:  1    Order Specific Question:   Supervising Provider    Answer:   Crecencio Mc [[id number]]    Return precautions given.   Risks, benefits, and alternatives of the medications and treatment plan prescribed today were discussed, and patient expressed understanding.   Education regarding symptom management and  diagnosis given to patient on AVS.  Continue to follow with Burnard Hawthorne, FNP for routine health maintenance.   Debra Whitney and I agreed with plan.   Mable Paris, FNP

## 2016-10-27 ENCOUNTER — Ambulatory Visit (INDEPENDENT_AMBULATORY_CARE_PROVIDER_SITE_OTHER): Payer: 59 | Admitting: Family

## 2016-10-27 ENCOUNTER — Encounter: Payer: Self-pay | Admitting: Family

## 2016-10-27 VITALS — BP 108/68 | HR 82 | Temp 98.1°F | Resp 14 | Ht 61.0 in | Wt 180.0 lb

## 2016-10-27 DIAGNOSIS — I10 Essential (primary) hypertension: Secondary | ICD-10-CM | POA: Diagnosis not present

## 2016-10-27 DIAGNOSIS — F339 Major depressive disorder, recurrent, unspecified: Secondary | ICD-10-CM | POA: Diagnosis not present

## 2016-10-27 DIAGNOSIS — I4891 Unspecified atrial fibrillation: Secondary | ICD-10-CM | POA: Diagnosis not present

## 2016-10-27 LAB — BASIC METABOLIC PANEL
BUN: 8 mg/dL (ref 6–23)
CO2: 26 mEq/L (ref 19–32)
Calcium: 9.4 mg/dL (ref 8.4–10.5)
Chloride: 105 mEq/L (ref 96–112)
Creatinine, Ser: 0.67 mg/dL (ref 0.40–1.20)
GFR: 109.28 mL/min (ref >60.00)
GLUCOSE: 94 mg/dL (ref 70–99)
POTASSIUM: 4 meq/L (ref 3.5–5.1)
SODIUM: 137 meq/L (ref 135–145)

## 2016-10-27 LAB — MICROALBUMIN / CREATININE URINE RATIO
CREATININE, U: 100.9 mg/dL
MICROALB UR: 0.8 mg/dL (ref 0.0–1.9)
Microalb Creat Ratio: 0.8 mg/g (ref 0.0–30.0)

## 2016-10-27 MED ORDER — APIXABAN 5 MG PO TABS: 5.0000 mg | ORAL_TABLET | Freq: Two times a day (BID) | ORAL | 3 refills | Status: DC

## 2016-10-27 MED ORDER — CITALOPRAM HYDROBROMIDE 10 MG PO TABS: 10.0000 mg | ORAL_TABLET | Freq: Every day | ORAL | 1 refills | Status: DC

## 2016-10-27 MED ORDER — METOPROLOL TARTRATE 25 MG PO TABS: 25.0000 mg | ORAL_TABLET | Freq: Two times a day (BID) | ORAL | 5 refills | Status: DC

## 2016-10-27 NOTE — Patient Instructions (Addendum)
Follow up in  6 weeks  Trial celexa; you must come off the prilosec to take celexa as drug interaction. Please try zantac twice daily before meals if breakthorugh symptoms.   Pleasure meeting you  Managing Your Hypertension Hypertension is commonly called high blood pressure. This is when the force of your blood pressing against the walls of your arteries is too strong. Arteries are blood vessels that carry blood from your heart throughout your body. Hypertension forces the heart to work harder to pump blood, and may cause the arteries to become narrow or stiff. Having untreated or uncontrolled hypertension can cause heart attack, stroke, kidney disease, and other problems. What are blood pressure readings? A blood pressure reading consists of a higher number over a lower number. Ideally, your blood pressure should be below 120/80. The first ("top") number is called the systolic pressure. It is a measure of the pressure in your arteries as your heart beats. The second ("bottom") number is called the diastolic pressure. It is a measure of the pressure in your arteries as the heart relaxes. What does my blood pressure reading mean? Blood pressure is classified into four stages. Based on your blood pressure reading, your health care provider may use the following stages to determine what type of treatment you need, if any. Systolic pressure and diastolic pressure are measured in a unit called mm Hg. Normal   Systolic pressure: below 109.  Diastolic pressure: below 80. Elevated   Systolic pressure: 323-557.  Diastolic pressure: below 80. Hypertension stage 1     Diastolic pressure: 32-20. Hypertension stage 2   Systolic pressure: 254 or above.  Diastolic pressure: 90 or above. What health risks are associated with hypertension? Managing your hypertension is an important responsibility. Uncontrolled hypertension can lead to:  A heart attack.  A stroke.  A weakened blood vessel  (aneurysm).  Heart failure.  Kidney damage.  Eye damage.  Metabolic syndrome.  Memory and concentration problems. What changes can I make to manage my hypertension? Eating and drinking   Eat a diet that is high in fiber and potassium, and low in salt (sodium), added sugar, and fat. An example eating plan is called the DASH (Dietary Approaches to Stop Hypertension) diet. To eat this way:  Eat plenty of fresh fruits and vegetables. Try to fill half of your plate at each meal with fruits and vegetables.  Eat whole grains, such as whole wheat pasta, brown rice, or whole grain bread. Fill about one quarter of your plate with whole grains.  Eat low-fat diary products.  Avoid fatty cuts of meat, processed or cured meats, and poultry with skin. Fill about one quarter of your plate with lean proteins such as fish, chicken without skin, beans, eggs, and tofu.  Avoid premade and processed foods. These tend to be higher in sodium, added sugar, and fat.     Lifestyle   Work with your health care provider to maintain a healthy body weight, or to lose weight. Ask what an ideal weight is for you.  Get at least 30 minutes of exercise that causes your heart to beat faster (aerobic exercise) most days of the week. Activities may include walking, swimming, or biking.       Monitoring   Monitor your blood pressure at home as told by your health care provider. Your personal target blood pressure may vary depending on your medical conditions, your age, and other factors.  Have your blood pressure checked regularly, as often as told  by your health care provider. Working with your health care provider   Review all the medicines you take with your health care provider because there may be side effects or interactions.  Talk with your health care provider about your diet, exercise habits, and other lifestyle factors that may be contributing to hypertension.  Visit your health care provider  regularly. Your health care provider can help you create and adjust your plan for managing hypertension. Will I need medicine to control my blood pressure? Your health care provider may prescribe medicine if lifestyle changes are not enough to get your blood pressure under control, and if:  Your systolic blood pressure is 130 or higher.  Your diastolic blood pressure is 80 or higher. Take medicines only as told by your health care provider. Follow the directions carefully. Blood pressure medicines must be taken as prescribed. The medicine does not work as well when you skip doses. Skipping doses also puts you at risk for problems. Contact a health care provider if:  You think you are having a reaction to medicines you have taken.  You have repeated (recurrent) headaches.  You feel dizzy.  You have swelling in your ankles.  You have trouble with your vision. Get help right away if:  You develop a severe headache or confusion.  You have unusual weakness or numbness, or you feel faint.  You have severe pain in your chest or abdomen.  You vomit repeatedly.  You have trouble breathing. Summary  Hypertension is when the force of blood pumping through your arteries is too strong. If this condition is not controlled, it may put you at risk for serious complications.  Your personal target blood pressure may vary depending on your medical conditions, your age, and other factors. For most people, a normal blood pressure is less than 120/80.  Hypertension is managed by lifestyle changes, medicines, or both. Lifestyle changes include weight loss, eating a healthy, low-sodium diet, exercising more, and limiting alcohol. This information is not intended to replace advice given to you by your health care provider. Make sure you discuss any questions you have with your health care provider. Document Released: 02/18/2012 Document Revised: 04/23/2016 Document Reviewed: 04/23/2016 Elsevier  Interactive Patient Education  2017 [employer].

## 2016-10-27 NOTE — Assessment & Plan Note (Addendum)
NSR. HR 82. On eliquis- unlikely to be long term.  Following with Fletcher Anon.

## 2016-10-27 NOTE — Assessment & Plan Note (Signed)
Controlled. Will continue current regimen 

## 2016-10-27 NOTE — Assessment & Plan Note (Signed)
Trial celexa; discontinued PPI due to drug interaction. Follow up 6-8 weeks.

## 2016-10-28 ENCOUNTER — Telehealth: Payer: Self-pay | Admitting: Cardiovascular Disease

## 2016-10-28 ENCOUNTER — Ambulatory Visit: Payer: Self-pay | Admitting: Family

## 2016-10-28 NOTE — Telephone Encounter (Signed)
Pt would like a call regarding the pain she is having in her chest. Pt states she is unable to come in at the available time offer on 5/25 with Murray Hodgkins, NP

## 2016-10-28 NOTE — Telephone Encounter (Addendum)
S/w pt who reports feeling bad today; describes 6/10 pain as a "pulling in my left upper chest area" that is often worse with ambulation and bending. Advised pt it sounds like a pulled muscles and she should try tylenol for pain.  She was prescribed metoprolol and apixaban during recent hospital admission for afib.  She has been taking as prescribed. HR 70 at 9:30am. She did not take BP. Her coworker took VS at 3:00, BP 140/90 HR 84.  She does not feel to be in afib. Reviewed s/s that would require attention in an ER. She verbalized understanding. Advised to monitor HR and continue medications as prescribed.  Would like a sooner post hospital f/u w/Arida. Advised he has no openings this week. She is going out of town Friday - Wednesday. Will try to get sooner appt and will call back. Pt agreeable w/plan.    Marland Kitchen

## 2016-10-28 NOTE — Telephone Encounter (Signed)
Pt agreeable to see Ignacia Bayley, NP, tomorrow at 9:30am She reports 7-8/10 when swallowing or moving around. Reports the pain is now moving up her neck. Reviewed with Ignacia Bayley, NP, who advises to try prilosec or mylanta and f/u w/PCP. Advised pt to continue to monitor sx and if unsure, she can proceed to the ED for further workup. Pt agreeable w/plan and will call tomorrow morning to let me know if she wants to keep the appointment.

## 2016-10-28 NOTE — Telephone Encounter (Signed)
Reviewed with Ignacia Bayley, NP who is agreeable to see pt 5/23, 9:30am Attempted to contact pt at work. Phone lines off as it is after 5pm. Left detailed message on pt's cell VM

## 2016-10-28 NOTE — Telephone Encounter (Signed)
Patient has weird feeling of chest discomfort.  C/o L side pulling above breast   Please call to discuss concerns

## 2016-10-29 ENCOUNTER — Telehealth: Payer: Self-pay | Admitting: Cardiovascular Disease

## 2016-10-29 NOTE — Telephone Encounter (Signed)
Pt reports still having the "grabbing" sensation in upper left chest this morning but less than yesterday. Describes as 2 out of 10 burning sensation in her chest and throat.  She started taking zantac yesterday and has noticed improvement. She does not feel that she needs to be seen by cardiology today. Encouraged pt to continue to monitor sx,  f/u w/PCP, and keep June 5 appt w/Dr. Fletcher Anon. Pt verbalized understanding and is agreeable w/plan.

## 2016-10-30 ENCOUNTER — Ambulatory Visit: Payer: Self-pay | Admitting: Family Medicine

## 2016-11-06 DIAGNOSIS — D485 Neoplasm of uncertain behavior of skin: Secondary | ICD-10-CM | POA: Diagnosis not present

## 2016-11-06 DIAGNOSIS — D225 Melanocytic nevi of trunk: Secondary | ICD-10-CM | POA: Diagnosis not present

## 2016-11-06 DIAGNOSIS — D224 Melanocytic nevi of scalp and neck: Secondary | ICD-10-CM | POA: Diagnosis not present

## 2016-11-06 DIAGNOSIS — D2239 Melanocytic nevi of other parts of face: Secondary | ICD-10-CM | POA: Diagnosis not present

## 2016-11-06 DIAGNOSIS — D229 Melanocytic nevi, unspecified: Secondary | ICD-10-CM | POA: Diagnosis not present

## 2016-11-11 ENCOUNTER — Encounter: Payer: Self-pay | Admitting: Cardiovascular Disease

## 2016-11-11 ENCOUNTER — Ambulatory Visit (INDEPENDENT_AMBULATORY_CARE_PROVIDER_SITE_OTHER): Payer: 59 | Admitting: Cardiovascular Disease

## 2016-11-11 VITALS — BP 100/62 | HR 68 | Ht 61.0 in | Wt 179.0 lb

## 2016-11-11 DIAGNOSIS — I10 Essential (primary) hypertension: Secondary | ICD-10-CM

## 2016-11-11 DIAGNOSIS — I480 Paroxysmal atrial fibrillation: Secondary | ICD-10-CM

## 2016-11-11 NOTE — Patient Instructions (Signed)
Medication Instructions: Continue same medications.   Labwork: None.   Procedures/Testing: None.   Follow-Up: 3 months with Dr. Ninfa Giannelli.   Any Additional Special Instructions Will Be Listed Below (If Applicable).     If you need a refill on your cardiac medications before your next appointment, please call your pharmacy.   

## 2016-11-11 NOTE — Progress Notes (Signed)
Cardiology Office Note   Date:  11/11/2016   ID:  Debra Whitney, DOB January 05, 1986, MRN [id number]  PCP:  Burnard Hawthorne, FNP  Cardiologist:   Kathlyn Sacramento, MD   Chief Complaint  Patient presents with  . other    Follow up from [facility]; A-Fib. Pt. c/o some shortness of breath with elevated BP. Meds reviewed by the pt. verbally.       History of Present Illness: Debra Whitney is a 31 y.o. female who presents for a follow-up visit after recent hospitalization for atrial fibrillation. She presented with palpitations and was found to be in atrial fibrillation with rapid ventricular response with a heart rate of 170 bpm. It was in the setting of large or intake of alcoholic than her usual with vomiting and hypokalemia. She converted to sinus rhythm within 24 hours. Echocardiogram was normal. She was discharged home on metoprolol and Eliquis. She has been doing well overall with no recurrent palpitations. She reports improvement in shortness of breath and has no chest pain at the present time. She does complain of occasional episodes of feeling hot and flushed that these episodes improved with metoprolol.    Past Medical History:  Diagnosis Date  . Atrial fibrillation ([place])    a. diagnosed on 10/20/2016; chads2vasc => at least 2 (HTN, sex category), possibly 3 given history of gestational diabetes  . Endometriosis   . GERD (gastroesophageal reflux disease)   . Gestational diabetes   . Hypertension   . Menorrhagia   . Migraine   . Obesity     Past Surgical History:  Procedure Laterality Date  . DILATION AND CURETTAGE OF UTERUS    . endometrial surgery    . HERNIA REPAIR    . HYSTEROSCOPY W/D&C  11/29/2015   Procedure: DILATATION AND CURETTAGE /HYSTEROSCOPY;  Surgeon: Malachy Mood, MD;  Location: [facility] ORS;  Service: Gynecology;;  . LAPAROSCOPY N/A 11/29/2015   Procedure: LAPAROSCOPY DIAGNOSTIC;  Surgeon: Malachy Mood, MD;  Location: [facility] ORS;  Service:  Gynecology;  Laterality: N/A;  . NASAL SINUS SURGERY    . WISDOM TOOTH EXTRACTION       Current Outpatient Prescriptions  Medication Sig Dispense Refill  . apixaban (ELIQUIS) 5 MG TABS tablet Take 1 tablet (5 mg total) by mouth 2 (two) times daily. 60 tablet 3  . citalopram (CELEXA) 10 MG tablet Take 1 tablet (10 mg total) by mouth daily. 30 tablet 1  . loratadine (CLARITIN) 10 MG tablet Take 10 mg by mouth daily.    . metoprolol tartrate (LOPRESSOR) 25 MG tablet Take 1 tablet (25 mg total) by mouth 2 (two) times daily. 60 tablet 5  . ondansetron (ZOFRAN) 4 MG tablet Take 4 mg by mouth every 8 (eight) hours as needed for nausea or vomiting.     No current facility-administered medications for this visit.     Allergies:   Percocet [oxycodone-acetaminophen]    Social History:  The patient  reports that she has never smoked. She has never used smokeless tobacco. She reports that she drinks alcohol. She reports that she does not use drugs.   Family History:  The patient's family history includes CVA in her maternal grandmother; Diabetes in her mother; Hypertension in her mother.    ROS:  Please see the history of present illness.   Otherwise, review of systems are positive for none.   All other systems are reviewed and negative.    PHYSICAL EXAM: VS:  BP 100/62 (  BP Location: Right Arm, Patient Position: Sitting, Cuff Size: Normal)   Pulse 68   Ht 5\' 1"  (1.549 m)   Wt 179 lb (81.2 kg)   BMI 33.82 kg/m  , BMI Body mass index is 33.82 kg/m. GEN: Well nourished, well developed, in no acute distress  HEENT: normal  Neck: no JVD, carotid bruits, or masses Cardiac: RRR; no murmurs, rubs, or gallops,no edema  Respiratory:  clear to auscultation bilaterally, normal work of breathing GI: soft, nontender, nondistended, + BS MS: no deformity or atrophy  Skin: warm and dry, no rash Neuro:  Strength and sensation are intact Psych: euthymic mood, full affect   EKG:  EKG is ordered  today. The ekg ordered today demonstrates normal sinus rhythm with no significant ST or T wave changes.   Recent Labs: 10/20/2016: ALT 27; Magnesium 1.9; TSH 2.011 10/21/2016: Hemoglobin 11.6; Platelets 330 10/27/2016: BUN 8; Creatinine, Ser 0.67; Potassium 4.0; Sodium 137    Lipid Panel    Component Value Date/Time   CHOL 147 10/21/2016 0137   TRIG 178 (H) 10/21/2016 0137   HDL 41 10/21/2016 0137   CHOLHDL 3.6 10/21/2016 0137   VLDL 36 10/21/2016 0137   LDLCALC 70 10/21/2016 0137      Wt Readings from Last 3 Encounters:  11/11/16 179 lb (81.2 kg)  10/27/16 180 lb (81.6 kg)  10/21/16 176 lb 4.8 oz (80 kg)      No flowsheet data found.    ASSESSMENT AND PLAN:  1.  Paroxysmal atrial fibrillation:The patient had one episode of atrial fibrillation in the setting of excessive alcohol use, vomiting and hypokalemia. Thus, it was a triggered episode. She is currently on Eliquis given CHADS VASc score of 2. However, given that her episode was in the setting of the above, I don't think she will need to be on long-term anticoagulation unless she develops recurrent episodes of atrial fibrillation. I am planning to keep her on Eliquis for 3 months and reevaluate her at that time. If no episodes of atrial fibrillation, the plan is to stop Eliquis. She is contemplating pregnancy but I recommended to avoid that for now until she is off Eliquis.  2. Essential hypertension: Blood pressure is controlled on metoprolol.   Disposition:   FU with me in 3 months  Signed,  Kathlyn Sacramento, MD  11/11/2016 3:08 PM    [facility]

## 2016-12-01 ENCOUNTER — Telehealth: Payer: Self-pay | Admitting: Family

## 2016-12-01 ENCOUNTER — Ambulatory Visit: Payer: Self-pay | Admitting: Family

## 2016-12-01 DIAGNOSIS — I10 Essential (primary) hypertension: Secondary | ICD-10-CM

## 2016-12-01 DIAGNOSIS — D224 Melanocytic nevi of scalp and neck: Secondary | ICD-10-CM | POA: Diagnosis not present

## 2016-12-01 DIAGNOSIS — D485 Neoplasm of uncertain behavior of skin: Secondary | ICD-10-CM | POA: Diagnosis not present

## 2016-12-01 DIAGNOSIS — D229 Melanocytic nevi, unspecified: Secondary | ICD-10-CM | POA: Diagnosis not present

## 2016-12-01 DIAGNOSIS — D225 Melanocytic nevi of trunk: Secondary | ICD-10-CM | POA: Diagnosis not present

## 2016-12-01 MED ORDER — METOPROLOL TARTRATE 25 MG PO TABS: 25.0000 mg | ORAL_TABLET | Freq: Two times a day (BID) | ORAL | 1 refills | Status: DC

## 2016-12-01 NOTE — Telephone Encounter (Signed)
Pt had appt with Arnett today, which had to be canceled because of Arnett out of office. Pt states she is out of BP medication. Please advise.

## 2016-12-01 NOTE — Telephone Encounter (Signed)
Medication has been refilled for 30 days.  

## 2016-12-05 ENCOUNTER — Other Ambulatory Visit: Payer: Self-pay

## 2016-12-05 ENCOUNTER — Ambulatory Visit: Payer: Self-pay | Admitting: Family Medicine

## 2016-12-05 MED ORDER — METOPROLOL TARTRATE 25 MG PO TABS: 25.0000 mg | ORAL_TABLET | Freq: Two times a day (BID) | ORAL | 0 refills | Status: DC

## 2016-12-05 NOTE — Telephone Encounter (Signed)
Patient was called stating script needed to be sent to [place] will resend script to correct pharmacy.

## 2016-12-05 NOTE — Addendum Note (Signed)
Addended by: Johna Sheriff on: 12/05/2016 11:42 AM   Modules accepted: Orders

## 2016-12-05 NOTE — Telephone Encounter (Signed)
Error

## 2016-12-23 ENCOUNTER — Encounter: Payer: Self-pay | Admitting: *Deleted

## 2016-12-23 ENCOUNTER — Ambulatory Visit (INDEPENDENT_AMBULATORY_CARE_PROVIDER_SITE_OTHER): Payer: 59 | Admitting: Family

## 2016-12-23 VITALS — BP 110/78 | HR 81 | Temp 98.6°F | Resp 12 | Wt 180.8 lb

## 2016-12-23 DIAGNOSIS — J309 Allergic rhinitis, unspecified: Secondary | ICD-10-CM | POA: Diagnosis not present

## 2016-12-23 DIAGNOSIS — F339 Major depressive disorder, recurrent, unspecified: Secondary | ICD-10-CM | POA: Diagnosis not present

## 2016-12-23 DIAGNOSIS — R0602 Shortness of breath: Secondary | ICD-10-CM

## 2016-12-23 DIAGNOSIS — I4891 Unspecified atrial fibrillation: Secondary | ICD-10-CM

## 2016-12-23 MED ORDER — CITALOPRAM HYDROBROMIDE 20 MG PO TABS: 20.0000 mg | ORAL_TABLET | Freq: Every day | ORAL | 1 refills | Status: DC

## 2016-12-23 MED ORDER — FLUTICASONE PROPIONATE 50 MCG/ACT NA SUSP: 2.0000 | Freq: Every day | NASAL | 2 refills | Status: DC

## 2016-12-23 NOTE — Assessment & Plan Note (Signed)
Discussed with patient that I suspect postnasal drip is contributing to cough. Patient will restart Flonase -prescription has been sent. Also advised patient to stay on antihistamine. Return precautions given.

## 2016-12-23 NOTE — Assessment & Plan Note (Signed)
Low suspicion that shortness of breath is from atrial fib as she is in normal sinus rhythm at this time. Continues  to follow with cardiology

## 2016-12-23 NOTE — Progress Notes (Signed)
Subjective:    Patient ID: Debra Whitney, female    DOB: Oct 14, 1985, 31 y.o.   MRN: [id number]  CC: Debra Whitney is a 31 y.o. female who presents today for follow up.   HPI: Depression- on celexa. Recently moved. Making friends in [place]. Some days more tearful when thinks about miscarriage. No thoughts of hurting herself or anyone else.  Dry Cough x 2-3 weeks, waxing waning. Thinking may be allergies. Worse at morning and night.    Endorses SOB with exertion with stairs on long distances in a parking lot x 2 months, unchanged and not affected by recent cough. No wheezing,  Palpitations, chest pain, le swelling, chest congestion, CP. Had been on zyrtec for seasonal allergies.    No h/o smoking. Remote h/o asthma as child- used inhaler at that time.   NO h/o DVT. No ocp. No h/o cancer. No recent immobilization.   No  Longer in afib       HISTORY:  Past Medical History:  Diagnosis Date  . Atrial fibrillation ([place])    a. diagnosed on 10/20/2016; chads2vasc => at least 2 (HTN, sex category), possibly 3 given history of gestational diabetes  . Endometriosis   . GERD (gastroesophageal reflux disease)   . Gestational diabetes   . Hypertension   . Menorrhagia   . Migraine   . Obesity    Past Surgical History:  Procedure Laterality Date  . DILATION AND CURETTAGE OF UTERUS    . endometrial surgery    . HERNIA REPAIR    . HYSTEROSCOPY W/D&C  11/29/2015   Procedure: DILATATION AND CURETTAGE /HYSTEROSCOPY;  Surgeon: Malachy Mood, MD;  Location: [facility] ORS;  Service: Gynecology;;  . LAPAROSCOPY N/A 11/29/2015   Procedure: LAPAROSCOPY DIAGNOSTIC;  Surgeon: Malachy Mood, MD;  Location: [facility] ORS;  Service: Gynecology;  Laterality: N/A;  . NASAL SINUS SURGERY    . WISDOM TOOTH EXTRACTION     Family History  Problem Relation Age of Onset  . Diabetes Mother   . Hypertension Mother   . CVA Maternal Grandmother     Allergies: Percocet  [oxycodone-acetaminophen] Current Outpatient Prescriptions on File Prior to Visit  Medication Sig Dispense Refill  . apixaban (ELIQUIS) 5 MG TABS tablet Take 1 tablet (5 mg total) by mouth 2 (two) times daily. 60 tablet 3  . loratadine (CLARITIN) 10 MG tablet Take 10 mg by mouth daily.    . metoprolol tartrate (LOPRESSOR) 25 MG tablet Take 1 tablet (25 mg total) by mouth 2 (two) times daily. 60 tablet 0   No current facility-administered medications on file prior to visit.     Social History  Substance Use Topics  . Smoking status: Never Smoker  . Smokeless tobacco: Never Used  . Alcohol use Yes     Comment: occasionally    Review of Systems  Constitutional: Negative for chills and fever.  HENT: Positive for postnasal drip. Negative for congestion, sinus pain and sore throat.   Respiratory: Positive for cough and shortness of breath. Negative for wheezing.   Cardiovascular: Negative for chest pain and palpitations.  Gastrointestinal: Negative for nausea and vomiting.  Neurological: Negative for dizziness, syncope and light-headedness.  Psychiatric/Behavioral: Negative for suicidal ideas. The patient is not nervous/anxious.       Objective:    BP 110/78 (BP Location: Left Arm, Patient Position: Sitting, Cuff Size: Normal)   Pulse 81   Temp 98.6 F (37 C) (Oral)   Resp 12   Wt 180 lb  12.8 oz (82 kg)   LMP 12/06/2016   SpO2 98%   BMI 34.16 kg/m  BP Readings from Last 3 Encounters:  12/23/16 110/78  11/11/16 100/62  10/27/16 108/68   Wt Readings from Last 3 Encounters:  12/23/16 180 lb 12.8 oz (82 kg)  11/11/16 179 lb (81.2 kg)  10/27/16 180 lb (81.6 kg)    Physical Exam  Constitutional: She appears well-developed and well-nourished.  HENT:  Head: Normocephalic and atraumatic.  Right Ear: Hearing, tympanic membrane, external ear and ear canal normal. No drainage, swelling or tenderness. No foreign bodies. Tympanic membrane is not erythematous and not bulging. No  middle ear effusion. No decreased hearing is noted.  Left Ear: Hearing, tympanic membrane, external ear and ear canal normal. No drainage, swelling or tenderness. No foreign bodies. Tympanic membrane is not erythematous and not bulging.  No middle ear effusion. No decreased hearing is noted.  Nose: Nose normal. No rhinorrhea. Right sinus exhibits no maxillary sinus tenderness and no frontal sinus tenderness. Left sinus exhibits no maxillary sinus tenderness and no frontal sinus tenderness.  Mouth/Throat: Uvula is midline and mucous membranes are normal. Posterior oropharyngeal erythema present. No oropharyngeal exudate, posterior oropharyngeal edema or tonsillar abscesses.  Eyes: Conjunctivae are normal.  Cardiovascular: Regular rhythm, normal heart sounds and normal pulses.   Pulmonary/Chest: Effort normal and breath sounds normal. She has no wheezes. She has no rhonchi. She has no rales.  Lymphadenopathy:       Head (right side): No submental, no submandibular, no tonsillar, no preauricular, no posterior auricular and no occipital adenopathy present.       Head (left side): No submental, no submandibular, no tonsillar, no preauricular, no posterior auricular and no occipital adenopathy present.    She has no cervical adenopathy.  Neurological: She is alert.  Skin: Skin is warm and dry.  Psychiatric: She has a normal mood and affect. Her speech is normal and behavior is normal. Thought content normal.  Vitals reviewed.      Assessment & Plan:   Problem List Items Addressed This Visit      Cardiovascular and Mediastinum   Atrial fibrillation with RVR ([facility])    Low suspicion that shortness of breath is from atrial fib as she is in normal sinus rhythm at this time. Continues  to follow with cardiology        Respiratory   Allergic rhinitis    Discussed with patient that I suspect postnasal drip is contributing to cough. Patient will restart Flonase -prescription has been sent. Also advised  patient to stay on antihistamine. Return precautions given.        Other   Depression, recurrent ([place])    Still slightly tearful. Advised patient to start higher dose of Celexa. Follow-up in 2-3 months, sooner if needed      Relevant Medications   citalopram (CELEXA) 20 MG tablet   SOB (shortness of breath) on exertion - Primary    HR 81. sa02 98%. For 2 months, unchanged. No acute respiratory distress. Patient is not labored in speech .Patient's currently on eliquis. No recent immobilization, surgery. Low risk PE based on wells score. Remote history of asthma as a child. We jointly agreed referral to pulmonology for further evaluation and formal testing. Will follow.       Relevant Medications   fluticasone (FLONASE) 50 MCG/ACT nasal spray   Other Relevant Orders   Ambulatory referral to Pulmonology       I have discontinued Ms. Eisenhardt's ondansetron  and citalopram. I am also having her start on citalopram and fluticasone. Additionally, I am having her maintain her loratadine, apixaban, and metoprolol tartrate.   Meds ordered this encounter  Medications  . citalopram (CELEXA) 20 MG tablet    Sig: Take 1 tablet (20 mg total) by mouth daily.    Dispense:  90 tablet    Refill:  1    Order Specific Question:   Supervising Provider    Answer:   Deborra Medina L [[id number]]  . fluticasone (FLONASE) 50 MCG/ACT nasal spray    Sig: Place 2 sprays into both nostrils daily.    Dispense:  16 g    Refill:  2    Order Specific Question:   Supervising Provider    Answer:   Crecencio Mc [[id number]]    Return precautions given.   Risks, benefits, and alternatives of the medications and treatment plan prescribed today were discussed, and patient expressed understanding.   Education regarding symptom management and diagnosis given to patient on AVS.  Continue to follow with Burnard Hawthorne, FNP for routine health maintenance.   Debra Whitney and I agreed with plan.   Mable Paris,  FNP

## 2016-12-23 NOTE — Assessment & Plan Note (Signed)
Still slightly tearful. Advised patient to start higher dose of Celexa. Follow-up in 2-3 months, sooner if needed

## 2016-12-23 NOTE — Progress Notes (Signed)
Pre-visit discussion using our clinic review tool. No additional management support is needed unless otherwise documented below in the visit note.  

## 2016-12-23 NOTE — Patient Instructions (Signed)
Trial of Flonase see this helps postnasal drip which may be contributing to cough. For shortness of breath which you have had for some time, I would like you to have a formal evaluation from pulmonology. Referral has been placed We will also increase her Celexa 20 mg Follow up 2-3 months

## 2016-12-23 NOTE — Assessment & Plan Note (Addendum)
HR 81. sa02 98%. For 2 months, unchanged. No acute respiratory distress. Patient is not labored in speech .Patient's currently on eliquis. No recent immobilization, surgery. Low risk PE based on wells score. Remote history of asthma as a child. We jointly agreed referral to pulmonology for further evaluation and formal testing. Will follow.

## 2017-01-08 ENCOUNTER — Telehealth: Payer: Self-pay | Admitting: Cardiovascular Disease

## 2017-01-08 NOTE — Telephone Encounter (Signed)
Spoke w/ pt.  She has not checked her vm messages, as she has poor reception.  Advised her of Ryan's recommendation.  She states she and her husband recently moved to the area and she doesn't know anyone and has no one to watch her child, so she will wait for him to get home later before proceeding to the ED. Advised her of the reasons why she should proceed, but she states that she will wait.

## 2017-01-08 NOTE — Telephone Encounter (Addendum)
Spoke w/ Debra Whitney.  She reports chest pain since she woke up this am, worse when she is moving around or bending over.  Pain is not as bad as when she previously went to the ED, but is has been hurting her all day so she has been limiting her activities. She is concerned as to whether she is back in afib and requests to be worked in this afternoon to be seen.  Asked Debra Whitney if she has any HR readings to report, she does not, but offers to check this now. I can hear background noise and inquired if Debra Whitney is driving - she states that she is and that she is also lightheaded. Advised Debra Whitney to pull the car over while we are on the phone and certainly not to try to count her pulse while she is driving.  She refuses to pull over, as she has to pick up her daughter while her husband is at work.   Debra Whitney was prescribed metoprolol 25 mg BID and Eliquis 5 mg BID. Debra Whitney's coworker told her that BB can make her tired, so she reduced her metoprolol to once daily.  She forgot to take it today, last dose was yesterday am.  Debra Whitney c/o bruising and prolonged bleeding after cutting herself twice, so she stopped the Eliquis on her own.  Debra Whitney states that she didn't think she needed either of these meds for very long, so "didn't think it was big deal" if she stopped them.  Discussed w/ Debra Whitney the purpose of both of these meds and the reasons for taking them BID. Advised her that if she is concerned about side effects, to call and discuss before stopping them, as there may be alternatives she can be switched to.  Advised her that if she were to want to proceed w/ procedure to help w/ her afib, such as DCCV, she would need remain on blood thinners for 4 weeks and not miss a single dose.  She states "I wish someone would have told me that.   Wait, I think somebody mentioned that". She reports HR of 120, but states that it is usually >100. Advised her to resume BID dosing of her metoprolol & Eliquis - to take them both as soon as she gets home. She is  agreeable and asks if she can be switched to something that has fewer side effects. Advised her that Dr. Fletcher Anon is out of the office, but I will send her concern to Christell Faith, PA in the event that he would like to make any changes in Dr. Tyrell Antonio absence, though since this is not an emergency, he may defer back to Dr. Fletcher Anon. She verbalizes understanding and is agreeable.

## 2017-01-08 NOTE — Telephone Encounter (Signed)
Attempted to contact pt to make sure she received my message. No answer -went straight to vm.

## 2017-01-08 NOTE — Telephone Encounter (Signed)
Left message for pt w/ Ryan's recommendation.  Asked her to call back w/ any questions, but to proceed directly to the ED.

## 2017-01-08 NOTE — Telephone Encounter (Signed)
Advise patient to go to ER given chest pain, noncompliance, and tachycardia. We are unable to definitively evaluate her over the phone at this time given the above.

## 2017-01-08 NOTE — Telephone Encounter (Signed)
Pt states she woke up this morning with Chest pain, and has had this all day. Pt c/o of Chest Pain: STAT if CP now or developed within 24 hours  1. Are you having CP right now? Yes   2. Are you experiencing any other symptoms (ex. SOB, nausea, vomiting, sweating)? Just gets hot and SOB   3. How long have you been experiencing CP? Just since this morning.  4. Is your CP continuous or coming and going? Continual more intense when she moves  5. Have you taken Nitroglycerin? no ? Pt states she is supposed to be taking Eliquis, but does not take it daily due to her bruising.

## 2017-01-08 NOTE — Telephone Encounter (Signed)
Still no answer at pt's mobile # - straight to vm.

## 2017-02-16 ENCOUNTER — Ambulatory Visit: Payer: Self-pay | Admitting: Cardiovascular Disease

## 2017-03-09 ENCOUNTER — Encounter: Payer: Self-pay | Admitting: Family

## 2017-03-10 ENCOUNTER — Telehealth: Payer: Self-pay | Admitting: Family

## 2017-03-10 NOTE — Telephone Encounter (Signed)
Left message for patient to return call back.  

## 2017-03-10 NOTE — Telephone Encounter (Signed)
Patient has been notified. Stated she will comply with directions.

## 2017-03-10 NOTE — Telephone Encounter (Signed)
Call pt-  There are serious drug interactions between celexa and diflucan which include heart arrythmia ( QT prolongation) .  I would advise topical medication versus oral.   I would be more comfortable seeing her to confirm diagnosis. Please advise OV.

## 2017-03-24 ENCOUNTER — Ambulatory Visit: Payer: Self-pay | Admitting: Family

## 2017-04-02 ENCOUNTER — Ambulatory Visit (INDEPENDENT_AMBULATORY_CARE_PROVIDER_SITE_OTHER): Payer: 59 | Admitting: Family

## 2017-04-02 ENCOUNTER — Encounter: Payer: Self-pay | Admitting: Family

## 2017-04-02 VITALS — BP 122/90 | HR 85 | Temp 98.2°F

## 2017-04-02 DIAGNOSIS — R51 Headache: Secondary | ICD-10-CM

## 2017-04-02 DIAGNOSIS — I10 Essential (primary) hypertension: Secondary | ICD-10-CM

## 2017-04-02 DIAGNOSIS — R519 Headache, unspecified: Secondary | ICD-10-CM

## 2017-04-02 DIAGNOSIS — I4891 Unspecified atrial fibrillation: Secondary | ICD-10-CM

## 2017-04-02 MED ORDER — METOPROLOL TARTRATE 50 MG PO TABS: 50.0000 mg | ORAL_TABLET | Freq: Two times a day (BID) | ORAL | 3 refills | Status: DC

## 2017-04-02 NOTE — Assessment & Plan Note (Addendum)
She is stopped eliquis. EKG shows normal sinus rhythm. Compared to prior EKG June of this year, no significant changes. Advised multiple times that she needs continue to follow Dr. Fletcher Anon to ensure staying off Eliquis as is the most appropriate action for her.

## 2017-04-02 NOTE — Patient Instructions (Addendum)
Increase metoprolol to see if helps with headache, anxiety; if no improvement or worsening features, I would like you to have MRI Brain.   Follow up one month with colleague  Monitor blood pressure,  Goal is less than 130/80; if persistently higher, please make sooner follow up appointment so we can recheck you blood pressure and manage medications  Ensure you have follow up with Dr Fletcher Anon

## 2017-04-02 NOTE — Progress Notes (Signed)
Subjective:    Patient ID: Debra Whitney, female    DOB: August 15, 1985, 31 y.o.   MRN: [id number]  CC: Debra Whitney is a 31 y.o. female who presents today for follow up.   HPI: H/o afib- wearing apple watch and HR usually 90-120's. No longer on eliquis. Notes rbruising easily, feeling more sluggish.  Hasn't seen Dr Fletcher Anon. Wanders if anxiety. Taking metoprolol daily.   Denies exertional chest pain or pressure, numbness or tingling radiating to left arm or jaw, syncope, dizziness, frequent headaches, changes in vision. SOB hasn't worsened. NO SOB with regular 'walking around' Notes when going up the stairs. However SOB with sitting. Not exercising.   Notes h/o headaches since a child. Worsening lately, 'which may be stress.' Describes 'migraine' and on right side. Triggered by sinus congestion. Endorses sinus congestion which is a trigger. No flashing lights, numbness to face. Feels nauseated during HA. On flonase , zyrtec. Headaches feel similar to HA's in the past. Takes good powder 2-4 packets for HA to go away. Excedrin doesn't work.  HA's are occurring 5 x per week. Tried imitrex and didn't life. Tried topamax as well however cannot recall if worked.  No vision changes.   No thoughts of hurting herself or anyone else.        HISTORY:  Past Medical History:  Diagnosis Date  . Atrial fibrillation ([place])    a. diagnosed on 10/20/2016; chads2vasc => at least 2 (HTN, sex category), possibly 3 given history of gestational diabetes  . Endometriosis   . GERD (gastroesophageal reflux disease)   . Gestational diabetes   . Hypertension   . Menorrhagia   . Migraine   . Obesity    Past Surgical History:  Procedure Laterality Date  . DILATION AND CURETTAGE OF UTERUS    . endometrial surgery    . HERNIA REPAIR    . HYSTEROSCOPY W/D&C  11/29/2015   Procedure: DILATATION AND CURETTAGE /HYSTEROSCOPY;  Surgeon: Malachy Mood, MD;  Location: [facility] ORS;  Service: Gynecology;;  .  LAPAROSCOPY N/A 11/29/2015   Procedure: LAPAROSCOPY DIAGNOSTIC;  Surgeon: Malachy Mood, MD;  Location: [facility] OR[facility];  Service: Gynecology;  Laterality: N/A;  . NASAL SINUS SURGERY    . WISDOM TOOTH EXTRACTION     Family History  Problem Relation Age of Onset  . Diabetes Mother   . Hypertension Mother   . CVA Maternal Grandmother     Allergies: Percocet [oxycodone-acetaminophen] Current Outpatient Prescriptions on File Prior to Visit  Medication Sig Dispense Refill  . citalopram (CELEXA) 20 MG tablet Take 1 tablet (20 mg total) by mouth daily. 90 tablet 1  . fluticasone (FLONASE) 50 MCG/ACT nasal spray Place 2 sprays into both nostrils daily. 16 g 2  . loratadine (CLARITIN) 10 MG tablet Take 10 mg by mouth daily.    Marland Kitchen apixaban (ELIQUIS) 5 MG TABS tablet Take 1 tablet (5 mg total) by mouth 2 (two) times daily. (Patient not taking: Reported on 04/02/2017) 60 tablet 3   No current facility-administered medications on file prior to visit.     Social History  Substance Use Topics  . Smoking status: Never Smoker  . Smokeless tobacco: Never Used  . Alcohol use Yes     Comment: occasionally    Review of Systems  Constitutional: Negative for chills and fever.  Eyes: Negative for visual disturbance.  Respiratory: Negative for cough.   Cardiovascular: Positive for palpitations. Negative for chest pain.  Gastrointestinal: Negative for nausea and vomiting.  Neurological:  Positive for headaches. Negative for dizziness.  Psychiatric/Behavioral: The patient is nervous/anxious.       Objective:    BP 122/90 (BP Location: Left Arm, Patient Position: Sitting, Cuff Size: Large)   Pulse 85   Temp 98.2 F (36.8 C) (Oral)   LMP 03/25/2017   SpO2 98%  BP Readings from Last 3 Encounters:  04/02/17 122/90  12/23/16 110/78  11/11/16 100/62   Wt Readings from Last 3 Encounters:  12/23/16 180 lb 12.8 oz (82 kg)  11/11/16 179 lb (81.2 kg)  10/27/16 180 lb (81.6 kg)    Physical Exam    Constitutional: She appears well-developed and well-nourished.  HENT:  Mouth/Throat: Uvula is midline, oropharynx is clear and moist and mucous membranes are normal.  Eyes: Pupils are equal, round, and reactive to light. Conjunctivae and EOM are normal.  Fundus normal bilaterally.   Cardiovascular: Normal rate, regular rhythm, normal heart sounds and normal pulses.   Pulmonary/Chest: Effort normal and breath sounds normal. She has no wheezes. She has no rhonchi. She has no rales.  Neurological: She is alert. She has normal strength. No cranial nerve deficit or sensory deficit. She displays a negative Romberg sign.  Reflex Scores:      Bicep reflexes are 2+ on the right side and 2+ on the left side.      Patellar reflexes are 2+ on the right side and 2+ on the left side. Grip equal and strong bilateral upper extremities. Gait strong and steady. Able to perform rapid alternating movement without difficulty.   Skin: Skin is warm and dry.  Psychiatric: She has a normal mood and affect. Her speech is normal and behavior is normal. Thought content normal.  Vitals reviewed.      Assessment & Plan:   Problem List Items Addressed This Visit      Cardiovascular and Mediastinum   Atrial fibrillation with RVR ([place])    She is stopped eliquis. EKG shows normal sinus rhythm. Compared to prior EKG June of this year, no significant changes. Advised multiple times that she needs continue to follow Dr. Fletcher Anon to ensure staying off Eliquis as is the most appropriate action for her.      Relevant Medications   metoprolol tartrate (LOPRESSOR) 50 MG tablet   Other Relevant Orders   EKG 12-Lead (Completed)   Essential hypertension   Relevant Medications   metoprolol tartrate (LOPRESSOR) 50 MG tablet     Other   Nonintractable headache - Primary    Reassured by normal neurologic exam. Headache is similar to headaches in the past. Discussed with patient, appropriate to increase metoprolol to see if this  helps with headaches, palpitations, anxiety. If not, we discussed starting Topamax which she's been on the past. Advised patient to me know if headaches were to increase in intensity, frequency and that point we would order MRI of brain. Return precautions given.      Relevant Medications   metoprolol tartrate (LOPRESSOR) 50 MG tablet       I am having Ms. Bloor maintain her loratadine, apixaban, citalopram, fluticasone, and metoprolol tartrate.   Meds ordered this encounter  Medications  . DISCONTD: metoprolol tartrate (LOPRESSOR) 50 MG tablet    Sig: Take 1 tablet (50 mg total) by mouth 2 (two) times daily.    Dispense:  180 tablet    Refill:  3    Order Specific Question:   Supervising Provider    Answer:   Deborra Medina L [[id number]]  . metoprolol tartrate (  LOPRESSOR) 50 MG tablet    Sig: Take 1 tablet (50 mg total) by mouth 2 (two) times daily.    Dispense:  180 tablet    Refill:  3    Return precautions given.   Risks, benefits, and alternatives of the medications and treatment plan prescribed today were discussed, and patient expressed understanding.   Education regarding symptom management and diagnosis given to patient on AVS.  Continue to follow with Burnard Hawthorne, FNP for routine health maintenance.   Debra Whitney and I agreed with plan.   Mable Paris, FNP

## 2017-04-02 NOTE — Assessment & Plan Note (Signed)
Reassured by normal neurologic exam. Headache is similar to headaches in the past. Discussed with patient, appropriate to increase metoprolol to see if this helps with headaches, palpitations, anxiety. If not, we discussed starting Topamax which she's been on the past. Advised patient to me know if headaches were to increase in intensity, frequency and that point we would order MRI of brain. Return precautions given.

## 2017-04-07 IMAGING — CT CT ABD-PELV W/ CM
2 of 4 series · 16 of 46 positions shown, 18 images · IV contrast (iopamidol)
Comparison: None.

CLINICAL DATA: Lower abdominal pain with nausea beginning at 2 a.m.
earlier today. No fever. History of endometriosis.

EXAM:
CT ABDOMEN AND PELVIS WITH CONTRAST
TECHNIQUE: Multidetector CT imaging of the abdomen and pelvis was performed
using the standard protocol following bolus administration of
intravenous contrast.
CONTRAST:  100mL [id number] IOPAMIDOL ([id number]) INJECTION 61%

[Series 3: routine abd pel with · axial · 0.76mm/px · z∈[-630,-200]mm · 13 of 94 slices shown, 15 images]
[im 4/94  soft-tissue]
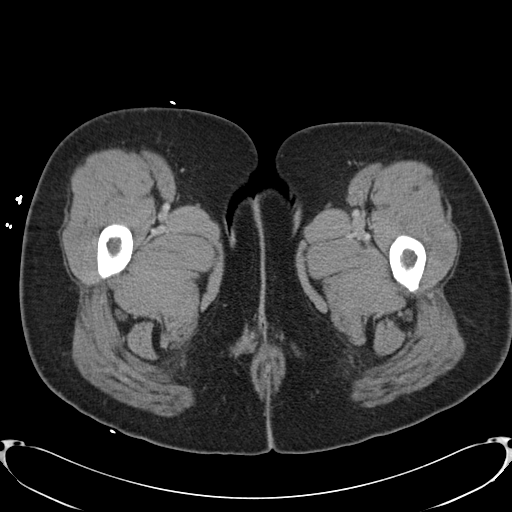
[im 4/94  bone]
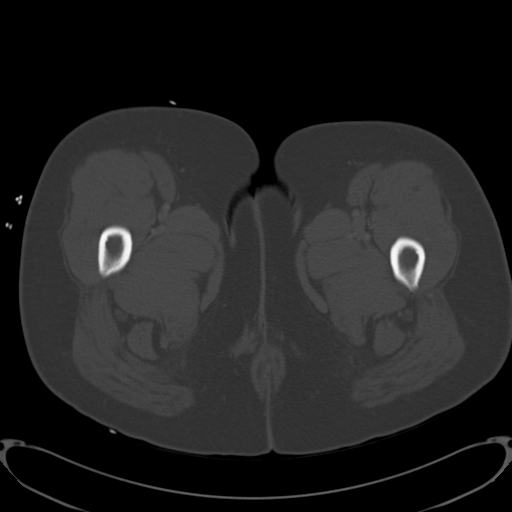
[im 12/94  soft-tissue]
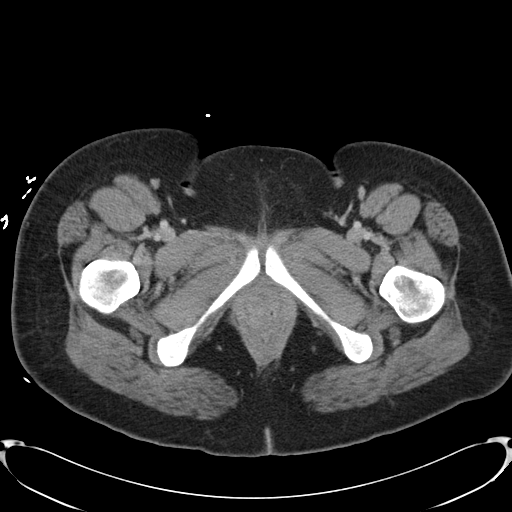
[im 19/94  soft-tissue]
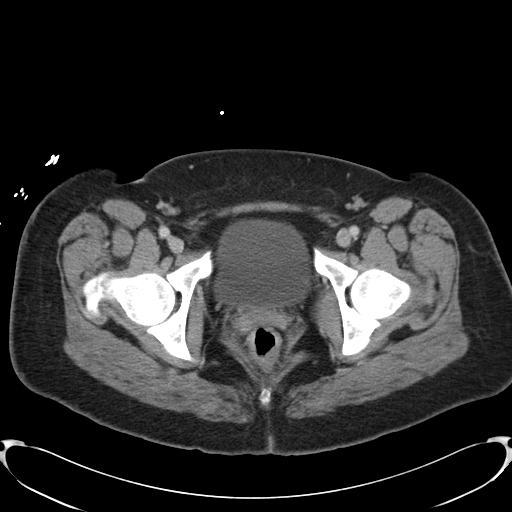
[im 27/94  soft-tissue]
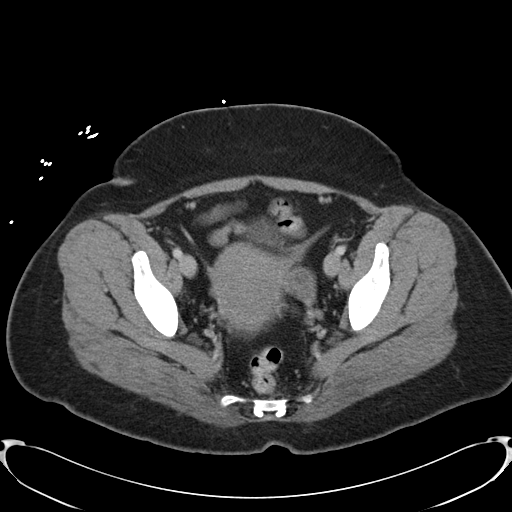
[im 34/94  soft-tissue]
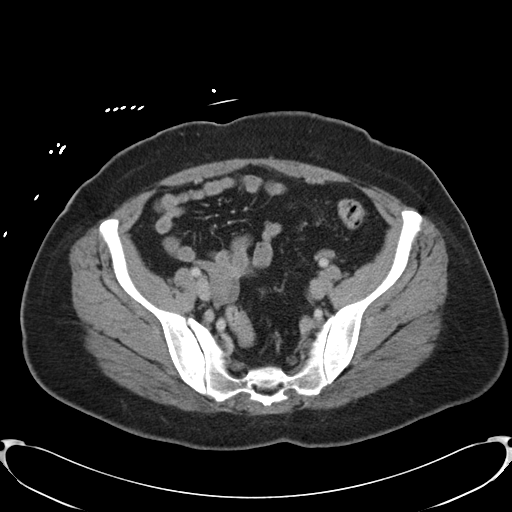
[im 41/94  soft-tissue]
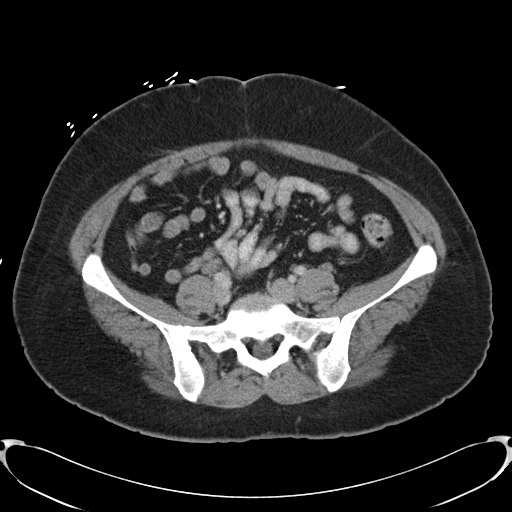
[im 49/94  soft-tissue]
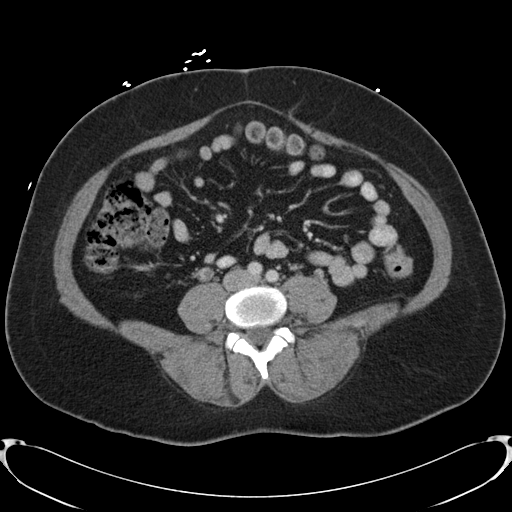
[im 53/94  soft-tissue]
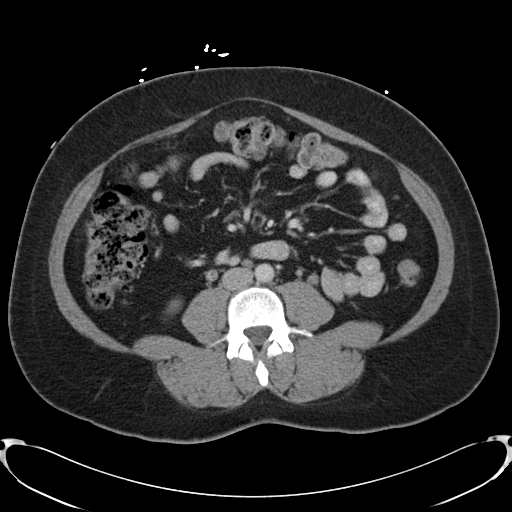
[im 60/94  soft-tissue]
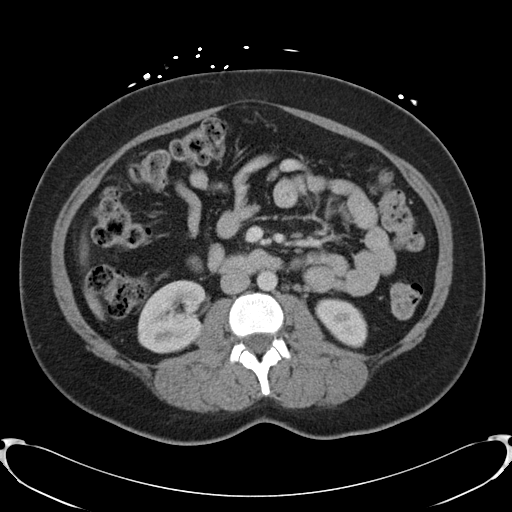
[im 60/94  bone]
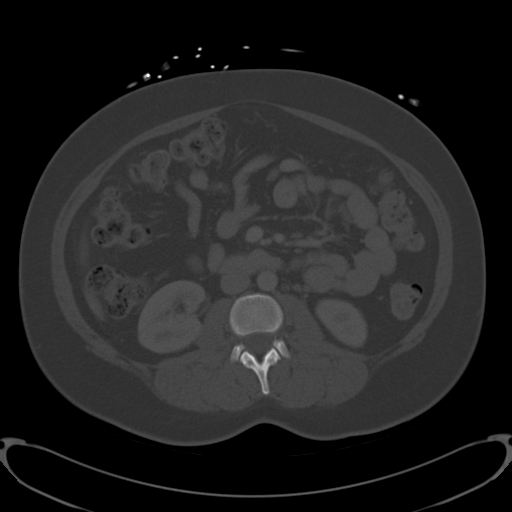
[im 67/94  soft-tissue]
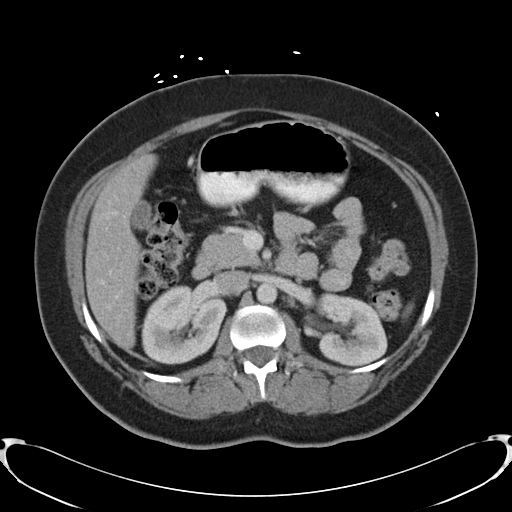
[im 75/94  soft-tissue]
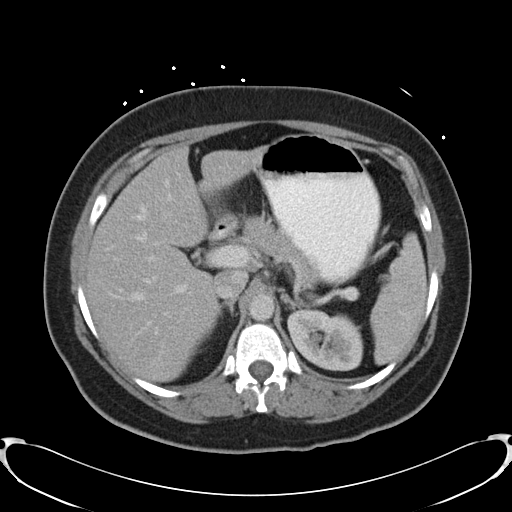
[im 82/94  soft-tissue]
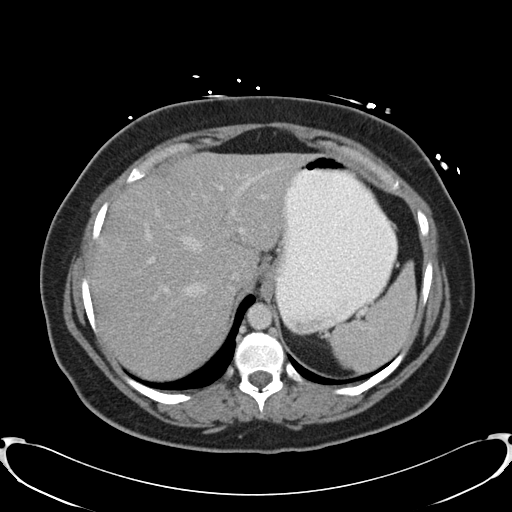
[im 90/94  soft-tissue]
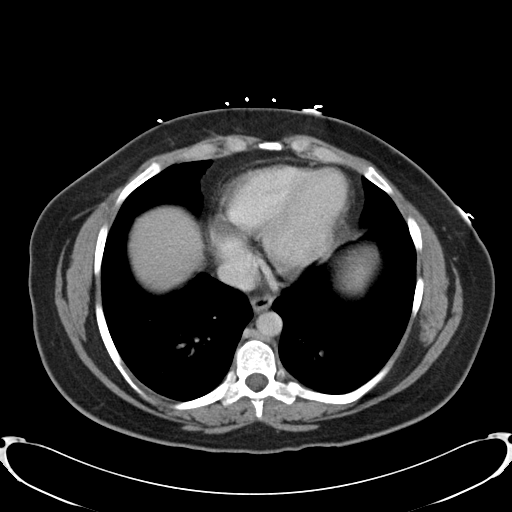

[Series 6: cor routine abd pel with · coronal · 0.71mm/px · 3 of 133 slices shown]
[im 45/133  soft-tissue]
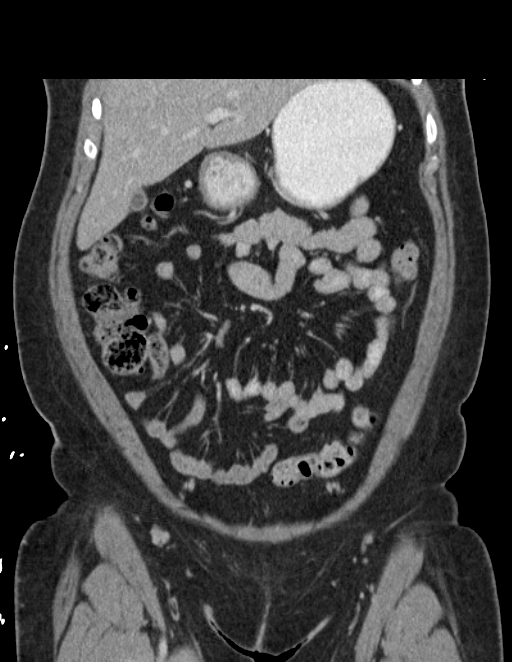
[im 59/133  soft-tissue]
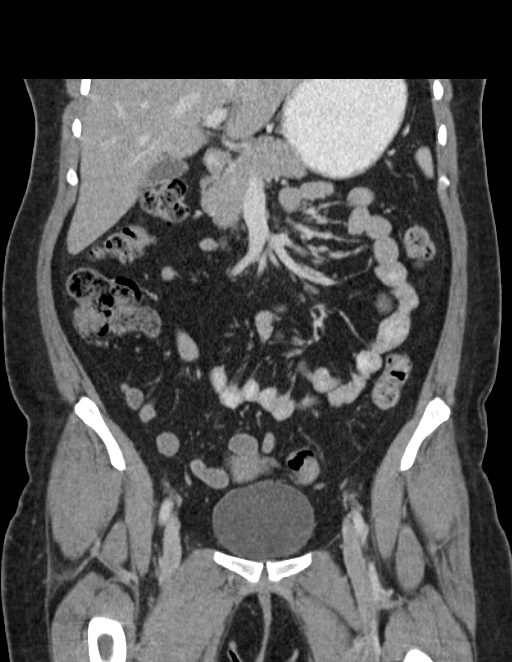
[im 74/133  soft-tissue]
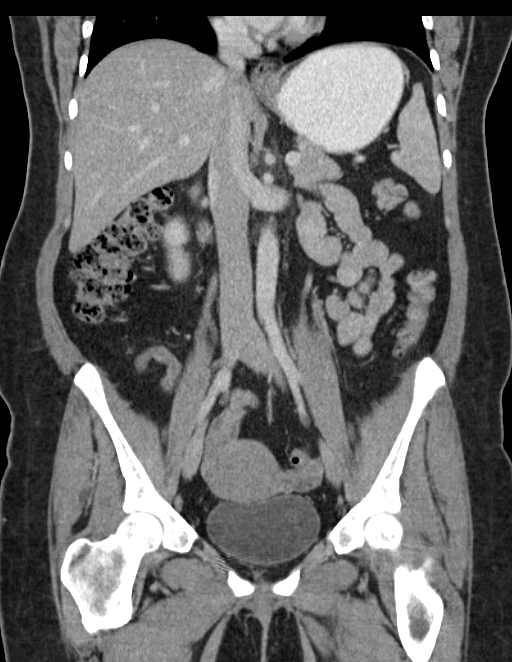

[16 of 46 positions shown; findings below may reference images not displayed]

FINDINGS: Lower chest:  No acute findings.

Hepatobiliary: No masses or other significant abnormality.

Pancreas: No mass, inflammatory changes, or other significant
abnormality.

Spleen: Within normal limits in size and appearance.

Adrenals/Urinary Tract: No masses identified. No evidence of
hydronephrosis.

Stomach/Bowel: No evidence of obstruction, inflammatory process, or
abnormal fluid collections. Normal appendix.

Vascular/Lymphatic: No pathologically enlarged lymph nodes. No
evidence of abdominal aortic aneurysm.

Reproductive: No mass or other significant abnormality. Prominent
parametrial veins, which in the appropriate clinical setting, could
indicate pelvic congestion syndrome.

Other: None.

Musculoskeletal:  No suspicious bone lesions identified.
IMPRESSION: No acute intra-abdominal or pelvic findings.

Prominent parametrial veins, which in the appropriate clinical
setting, could indicate pelvic congestion syndrome.

## 2017-04-14 DIAGNOSIS — R000 Tachycardia, unspecified: Secondary | ICD-10-CM | POA: Diagnosis not present

## 2017-04-14 DIAGNOSIS — H6693 Otitis media, unspecified, bilateral: Secondary | ICD-10-CM | POA: Diagnosis not present

## 2017-06-17 ENCOUNTER — Ambulatory Visit (INDEPENDENT_AMBULATORY_CARE_PROVIDER_SITE_OTHER): Payer: 59

## 2017-06-17 ENCOUNTER — Other Ambulatory Visit: Payer: Self-pay | Admitting: Advanced Practice Midwife

## 2017-06-17 ENCOUNTER — Ambulatory Visit (INDEPENDENT_AMBULATORY_CARE_PROVIDER_SITE_OTHER): Payer: 59 | Admitting: Advanced Practice Midwife

## 2017-06-17 ENCOUNTER — Encounter: Payer: Self-pay | Admitting: Advanced Practice Midwife

## 2017-06-17 VITALS — BP 118/74 | Wt 194.0 lb

## 2017-06-17 DIAGNOSIS — Z113 Encounter for screening for infections with a predominantly sexual mode of transmission: Secondary | ICD-10-CM | POA: Diagnosis not present

## 2017-06-17 DIAGNOSIS — Z6836 Body mass index (BMI) 36.0-36.9, adult: Secondary | ICD-10-CM | POA: Diagnosis not present

## 2017-06-17 DIAGNOSIS — O0990 Supervision of high risk pregnancy, unspecified, unspecified trimester: Secondary | ICD-10-CM

## 2017-06-17 DIAGNOSIS — O99210 Obesity complicating pregnancy, unspecified trimester: Secondary | ICD-10-CM

## 2017-06-17 DIAGNOSIS — O10919 Unspecified pre-existing hypertension complicating pregnancy, unspecified trimester: Secondary | ICD-10-CM

## 2017-06-17 DIAGNOSIS — Z362 Encounter for other antenatal screening follow-up: Secondary | ICD-10-CM

## 2017-06-17 DIAGNOSIS — Z131 Encounter for screening for diabetes mellitus: Secondary | ICD-10-CM | POA: Diagnosis not present

## 2017-06-17 DIAGNOSIS — Z8632 Personal history of gestational diabetes: Secondary | ICD-10-CM

## 2017-06-17 DIAGNOSIS — O09299 Supervision of pregnancy with other poor reproductive or obstetric history, unspecified trimester: Secondary | ICD-10-CM

## 2017-06-17 MED ORDER — LABETALOL HCL 100 MG PO TABS: 100.0000 mg | ORAL_TABLET | Freq: Every day | ORAL | 6 refills | Status: DC

## 2017-06-17 NOTE — Progress Notes (Signed)
NOB today.  

## 2017-06-17 NOTE — Progress Notes (Signed)
New Obstetric Patient H&P    Chief Complaint: "Desires prenatal care" The patient had a miscarriage 1.5 years ago and requests ultrasound for confirmation of pregnancy and reassurance.    History of Present Illness: Patient is a 32 y.o. [id number] Not Hispanic or Latino female, LMP 04/24/2017 presents with amenorrhea and positive home pregnancy test. Based on her LMP, her EDD is Estimated Date of Delivery: 01/29/18 and her EGA is [redacted]w[redacted]d. Cycles are 5. days, regular, and occur approximately every : 28 days. Her last pap smear was 2 years ago and was no abnormalities. Based on ultrasound today her EDD is 02/09/2018 and her EGA is [redacted]w[redacted]d. Her EDD is adjusted based on her ultrasound.   She had a urine pregnancy test which was positive 1 day(s)  ago. Her last menstrual period was normal and lasted for  5 day(s). Since her LMP she claims she has experienced fatigue, nausea, vomiting. She denies vaginal bleeding. Her past medical history is contributory for hypertension, obesity, Afib, headaches. Her prior pregnancies are notable for chronic HTN, gestational DM  Since her LMP, she admits to the use of tobacco products  no She claims she has gained   no pounds since the start of her pregnancy.  There are cats in the home in the home  no  She admits close contact with children on a regular basis  yes  She has had chicken pox in the past yes She has had Tuberculosis exposures, symptoms, or previously tested positive for TB   no Current or past history of domestic violence. no  Genetic Screening/Teratology Counseling: (Includes patient, baby's father, or anyone in either family with:)   58. Patient's age >/= 18 at [facility]  no 2. Thalassemia ([place], [place], [place], or Asian background): MCV<80  no 3. Neural tube defect (meningomyelocele, spina bifida, anencephaly)  no 4. Congenital heart defect  no  5. Down syndrome  no 6. Tay-Sachs (Jewish, [place])  no 7. Canavan's Disease  no 8. Sickle cell  disease or trait (African)  no  9. Hemophilia or other blood disorders  no  10. Muscular dystrophy  no  11. Cystic fibrosis  no  12. Huntington's Chorea  no  13. Mental retardation/autism  no 14. Other inherited genetic or chromosomal disorder  no 15. Maternal metabolic disorder (DM, PKU, etc)  no 16. Patient or FOB with a child with a birth defect not listed above no  16a. Patient or FOB with a birth defect themselves no 17. Recurrent pregnancy loss, or stillbirth  no  18. Any medications since LMP other than prenatal vitamins (include vitamins, supplements, OTC meds, drugs, alcohol)  yes 19. Any other genetic/environmental exposure to discuss  no  Infection History:   1. Lives with someone with TB or TB exposed  no  2. Patient or partner has history of genital herpes  no 3. Rash or viral illness since LMP  no 4. History of STI (GC, CT, HPV, syphilis, HIV)  no 5. History of recent travel :  no  Other pertinent information:  no     Review of Systems:10 point review of systems negative unless otherwise noted in HPI  Past Medical History:  Past Medical History:  Diagnosis Date  . Atrial fibrillation ([place])    a. diagnosed on 10/20/2016; chads2vasc => at least 2 (HTN, sex category), possibly 3 given history of gestational diabetes  . Endometriosis   . GERD (gastroesophageal reflux disease)   . Gestational diabetes   . Hypertension   .  Menorrhagia   . Migraine   . Obesity     Past Surgical History:  Past Surgical History:  Procedure Laterality Date  . DILATION AND CURETTAGE OF UTERUS    . endometrial surgery    . HERNIA REPAIR    . HYSTEROSCOPY W/D&C  11/29/2015   Procedure: DILATATION AND CURETTAGE /HYSTEROSCOPY;  Surgeon: Malachy Mood, MD;  Location: [facility] ORS;  Service: Gynecology;;  . LAPAROSCOPY N/A 11/29/2015   Procedure: LAPAROSCOPY DIAGNOSTIC;  Surgeon: Malachy Mood, MD;  Location: [facility] ORS;  Service: Gynecology;  Laterality: N/A;  . NASAL SINUS SURGERY      . WISDOM TOOTH EXTRACTION      Gynecologic History: Patient's last menstrual period was 04/24/2017.  Obstetric History: [id number]  Family History:  Family History  Problem Relation Age of Onset  . Diabetes Mother   . Hypertension Mother   . CVA Maternal Grandmother     Social History:  Social History   Socioeconomic History  . Marital status: Married    Spouse name: Not on file  . Number of children: Not on file  . Years of education: Not on file  . Highest education level: Not on file  Social Needs  . Financial resource strain: Not on file  . Food insecurity - worry: Not on file  . Food insecurity - inability: Not on file  . Transportation needs - medical: Not on file  . Transportation needs - non-medical: Not on file  Occupational History  . Not on file  Tobacco Use  . Smoking status: Never Smoker  . Smokeless tobacco: Never Used  Substance and Sexual Activity  . Alcohol use: Yes    Comment: occasionally  . Drug use: No  . Sexual activity: Yes    Birth control/protection: None  Other Topics Concern  . 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              Allergies:  Allergies  Allergen Reactions  . Percocet [Oxycodone-Acetaminophen] Nausea And Vomiting    Medications: Prior to Admission medications   Medication Sig Start Date End Date Taking? Authorizing Provider  citalopram (CELEXA) 20 MG tablet Take 1 tablet (20 mg total) by mouth daily. 12/23/16  Yes Arnett, Yvetta Coder, FNP  fluticasone (FLONASE) 50 MCG/ACT nasal spray Place 2 sprays into both nostrils daily. 12/23/16  Yes Arnett, Yvetta Coder, FNP  loratadine (CLARITIN) 10 MG tablet Take 10 mg by mouth daily.   Yes [provider]  labetalol (NORMODYNE) 100 MG tablet Take 1 tablet (100 mg total) by mouth daily. 06/17/17   Rod Can, CNM    Physical Exam Vitals: Blood pressure 118/74, weight 194 lb (88 kg), last  menstrual period 04/24/2017  General: NAD HEENT: normocephalic, anicteric Thyroid: no enlargement, no palpable nodules Pulmonary: No increased work of breathing, CTAB Cardiovascular: RRR, distal pulses 2+ Abdomen: NABS, soft, non-tender, non-distended.  Umbilicus without lesions.  No hepatomegaly, splenomegaly or masses palpable. No evidence of hernia, IUP at [redacted]w[redacted]d with heart tones at 96-101 Genitourinary:  Deferred for no PAP smear and no concerns. Aptima done with urine specimen Extremities: no edema, erythema, or tenderness Neurologic: Grossly intact Psychiatric: mood appropriate, affect full   Assessment: 32 y.o. [id number] at [redacted]w[redacted]d presenting to initiate prenatal care  Plan: 1) Avoid alcoholic beverages. 2) Patient encouraged not to smoke.  3) Discontinue the use of all non-medicinal drugs and chemicals.  4) Take prenatal vitamins daily.  5) Nutrition, food safety (fish, cheese advisories, and high nitrite foods) and exercise discussed. 6) Hospital and practice style discussed with cross coverage system.  7) Genetic Screening, such as with 1st Trimester Screening, cell free fetal DNA, AFP testing, and Ultrasound, as well as with amniocentesis and CVS as appropriate, is discussed with patient. At the conclusion of today's visit patient requested genetic testing 8) Patient is asked about travel to areas at risk for the Zika virus, and counseled to avoid travel and exposure to mosquitoes or sexual partners who may have themselves been exposed to the virus. Testing is discussed, and will be ordered as appropriate.  9) Lab work including early [place] today 10) Viability scan in 2 weeks  Rod Can, CNM

## 2017-06-17 NOTE — Patient Instructions (Signed)
Eating Plan for Pregnant Women While you are pregnant, your body will require additional nutrition to help support your growing baby. It is recommended that you consume:  150 additional calories each day during your first trimester.  300 additional calories each day during your second trimester.  300 additional calories each day during your third trimester.  Eating a healthy, well-balanced diet is very important for your health and for your baby's health. You also have a higher need for some vitamins and minerals, such as folic acid, calcium, iron, and vitamin D. What do I need to know about eating during pregnancy?  Do not try to lose weight or go on a diet during pregnancy.  Choose healthy, nutritious foods. Choose  of a sandwich with a glass of milk instead of a candy bar or a high-calorie sugar-sweetened beverage.  Limit your overall intake of foods that have "empty calories." These are foods that have little nutritional value, such as sweets, desserts, candies, sugar-sweetened beverages, and fried foods.  Eat a variety of foods, especially fruits and vegetables.  Take a prenatal vitamin to help meet the additional needs during pregnancy, specifically for folic acid, iron, calcium, and vitamin D.  Remember to stay active. Ask your health care provider for exercise recommendations that are specific to you.  Practice good food safety and cleanliness, such as washing your hands before you eat and after you prepare raw meat. This helps to prevent foodborne illnesses, such as listeriosis, that can be very dangerous for your baby. Ask your health care provider for more information about listeriosis. What does 150 extra calories look like? Healthy options for an additional 150 calories each day could be any of the following:  Plain low-fat yogurt (6-8 oz) with  cup of berries.  1 apple with 2 teaspoons of peanut butter.  Cut-up vegetables with  cup of hummus.  Low-fat chocolate milk  (8 oz or 1 cup).  1 string cheese with 1 medium orange.   of a peanut butter and jelly sandwich on whole-wheat bread (1 tsp of peanut butter).  For 300 calories, you could eat two of those healthy options each day. What is a healthy amount of weight to gain? The recommended amount of weight for you to gain is based on your pre-pregnancy BMI. If your pre-pregnancy BMI was:  Less than 18 (underweight), you should gain 28-40 lb.  18-24.9 (normal), you should gain 25-35 lb.  25-29.9 (overweight), you should gain 15-25 lb.  Greater than 30 (obese), you should gain 11-20 lb.  What if I am having twins or multiples? Generally, pregnant women who will be having twins or multiples may need to increase their daily calories by 300-600 calories each day. The recommended range for total weight gain is 25-54 lb, depending on your pre-pregnancy BMI. Talk with your health care provider for specific guidance about additional nutritional needs, weight gain, and exercise during your pregnancy. What foods can I eat? Grains Any grains. Try to choose whole grains, such as whole-wheat bread, oatmeal, or brown rice. Vegetables Any vegetables. Try to eat a variety of colors and types of vegetables to get a full range of vitamins and minerals. Remember to wash your vegetables well before eating. Fruits Any fruits. Try to eat a variety of colors and types of fruit to get a full range of vitamins and minerals. Remember to wash your fruits well before eating. Meats and Other Protein Sources Lean meats, including chicken, [place], fish, and lean cuts of beef, veal,  or pork. Make sure that all meats are cooked to "well done." Tofu. Tempeh. Beans. Eggs. Peanut butter and other nut butters. Seafood, such as shrimp, crab, and lobster. If you choose fish, select types that are higher in omega-3 fatty acids, including salmon, herring, mussels, trout, sardines, and pollock. Make sure that all meats are cooked to food-safe  temperatures. Dairy Pasteurized milk and milk alternatives. Pasteurized yogurt and pasteurized cheese. Cottage cheese. Sour cream. Beverages Water. Juices that contain 100% fruit juice or vegetable juice. Caffeine-free teas and decaffeinated coffee. Drinks that contain caffeine are okay to drink, but it is better to avoid caffeine. Keep your total caffeine intake to less than 200 mg each day (12 oz of coffee, tea, or soda) or as directed by your health care provider. Condiments Any pasteurized condiments. Sweets and Desserts Any sweets and desserts. Fats and Oils Any fats and oils. The items listed above may not be a complete list of recommended foods or beverages. Contact your dietitian for more options. What foods are not recommended? Vegetables Unpasteurized (raw) vegetable juices. Fruits Unpasteurized (raw) fruit juices. Meats and Other Protein Sources Cured meats that have nitrates, such as bacon, salami, and hotdogs. Luncheon meats, bologna, or other deli meats (unless they are reheated until they are steaming hot). Refrigerated pate, meat spreads from a meat counter, smoked seafood that is found in the refrigerated section of a store. Raw fish, such as sushi or sashimi. High mercury content fish, such as tilefish, shark, swordfish, and king mackerel. Raw meats, such as tuna or beef tartare. Undercooked meats and poultry. Make sure that all meats are cooked to food-safe temperatures. Dairy Unpasteurized (raw) milk and any foods that have raw milk in them. Soft cheeses, such as feta, queso blanco, queso fresco, Brie, Camembert cheeses, blue-veined cheeses, and Panela cheese (unless it is made with pasteurized milk, which must be stated on the label). Beverages Alcohol. Sugar-sweetened beverages, such as sodas, teas, or energy drinks. Condiments Homemade fermented foods and drinks, such as pickles, sauerkraut, or kombucha drinks. (Store-bought pasteurized versions of these are  okay.) Other Salads that are made in the store, such as ham salad, chicken salad, egg salad, tuna salad, and seafood salad. The items listed above may not be a complete list of foods and beverages to avoid. Contact your dietitian for more information. This information is not intended to replace advice given to you by your health care provider. Make sure you discuss any questions you have with your health care provider. Document Released: 03/10/2014 Document Revised: 11/01/2015 Document Reviewed: 11/08/2013 Elsevier Interactive Patient Education  2018 [employer]. Exercise During Pregnancy For people of all ages, exercise is an important part of being healthy. Exercise improves heart and lung function and helps to maintain strength, flexibility, and a healthy body weight. Exercise also boosts energy levels and elevates mood. For most women, maintaining an exercise routine throughout pregnancy is recommended. It is only on rare occasions and with certain medical conditions or pregnancy complications that women may be asked to limit or avoid exercise during pregnancy. What are some other benefits to exercising during pregnancy? Along with maintaining strength and flexibility, exercising throughout pregnancy can help to:  Keep strength in muscles that are very important during labor and childbirth.  Decrease low back pain during pregnancy.  Decrease the risk of developing gestational diabetes mellitus (GDM).  Improve blood sugar (glucose) control for women who have GDM.  Decrease the risk of developing preeclampsia. This is a serious condition that causes  high blood pressure along with other symptoms, such as swelling and headaches.  Decrease the risk of cesarean delivery.  Speed up the recovery after giving birth.  How often should I exercise? Unless your health care provider gives you different instructions, you should try to exercise on most days or all days of the week. In general, try  to exercise with moderate intensity for about 150 minutes per week. This can be spread out across several days, such as exercising for 30 minutes per day on 5 days of each week. You can tell that you are exercising at a moderate intensity if you have a higher heart rate and faster breathing, but you are still able to hold a conversation. What types of moderate-intensity exercise are recommended during pregnancy? There are many types of exercise that are safe for you to do during pregnancy. Unless your health care provider gives you different instructions, do a variety of exercises that safely increase your heart and breathing (cardiopulmonary) rates and help you to build and maintain muscle strength (strength training). You should always be able to talk in full sentences while exercising during pregnancy. Some examples of exercising that is safe to do during pregnancy include:  Brisk walking or hiking.  Swimming.  Water aerobics.  Riding a stationary bike.  Strength training.  Modified yoga or Pilates. Tell your instructor that you are pregnant. Avoid overstretching and avoid lying on your back for long periods of time.  Running or jogging. Only choose this type of exercise if: ? You ran or jogged regularly before your pregnancy. ? You can run or jog and still talk in complete sentences.  What types of exercise should I not do during pregnancy? Depending on your level of fitness and whether you exercised regularly before your pregnancy, you may be advised to limit vigorous-intensity exercise during your pregnancy. You can tell that you are exercising at a vigorous intensity if you are breathing much harder and faster and cannot hold a conversation while exercising. Some examples of exercising that you should avoid during pregnancy include:  Contact sports.  Activities that place you at risk for falling on or being hit in the belly, such as downhill skiing, water skiing, surfing, rock  climbing, cycling, gymnastics, and horseback riding.  Scuba diving.  Sky diving.  Yoga or Pilates in a room that is heated to extreme temperatures ("hot yoga" or "hot Pilates").  Jogging or running, unless you ran or jogged regularly before your pregnancy. While jogging or running, you should always be able to talk in full sentences. Do not run or jog so vigorously that you are unable to have a conversation.  If you are not used to exercising at elevation (more than 6,000 feet above sea level), do not do so during your pregnancy.  When should I avoid exercising during pregnancy? Certain medical conditions can make it unsafe to exercise during pregnancy, or they may increase your risk of miscarriage or early labor and birth. Some of these conditions include:  Some types of heart disease.  Some types of lung disease.  Placenta previa. This is when the placenta partially or completely covers the opening of the uterus (cervix).  Frequent bleeding from the vagina during your pregnancy.  Incompetent cervix. This is when your cervix does not remain as tightly closed during pregnancy as it should.  Premature labor.  Ruptured membranes. This is when the protective sac (amniotic sac) opens up and amniotic fluid leaks from your vagina.  Severely low  blood count (anemia).  Preeclampsia or pregnancy-caused high blood pressure.  Carrying more than one baby (multiple gestation) and having an additional risk of early labor.  Poorly controlled diabetes.  Being severely underweight or severely overweight.  Intrauterine growth restriction. This is when your baby's growth and development during pregnancy are slower than expected.  Other medical conditions. Ask your health care provider if any apply to you.  What else should I know about exercising during pregnancy? You should take these precautions while exercising during pregnancy:  Avoid overheating. ? Wear loose-fitting, breathable  clothes. ? Do not exercise in very high temperatures.  Avoid dehydration. Drink enough water before, during, and after exercise to keep your urine clear or pale yellow.  Avoid overstretching. Because of hormone changes during pregnancy, it is easy to overstretch muscles, tendons, and ligaments during pregnancy.  Start slowly and ask your health care provider to recommend types of exercise that are safe for you, if exercising regularly is new for you.  Pregnancy is not a time for exercising to lose weight. When should I seek medical care? You should stop exercising and call your health care provider if you have any unusual symptoms, such as:  Mild uterine contractions or abdominal cramping.  Dizziness that does not improve with rest.  When should I seek immediate medical care? You should stop exercising and call your local emergency services (911 in the [place] if you have any unusual symptoms, such as:  Sudden, severe pain in your low back or your belly.  Uterine contractions or abdominal cramping that do not improve with rest.  Chest pain.  Bleeding or fluid leaking from your vagina.  Shortness of breath.  This information is not intended to replace advice given to you by your health care provider. Make sure you discuss any questions you have with your health care provider. Document Released: 05/26/2005 Document Revised: 10/24/2015 Document Reviewed: 08/03/2014 Elsevier Interactive Patient Education  2018 [employer]. Prenatal Care WHAT IS PRENATAL CARE? Prenatal care is the process of caring for a pregnant woman before she gives birth. Prenatal care makes sure that she and her baby remain as healthy as possible throughout pregnancy. Prenatal care may be provided by a midwife, family practice health care provider, or a childbirth and pregnancy specialist (obstetrician). Prenatal care may include physical examinations, testing, treatments, and education on nutrition, lifestyle, and  social support services. WHY IS PRENATAL CARE SO IMPORTANT? Early and consistent prenatal care increases the chance that you and your baby will remain healthy throughout your pregnancy. This type of care also decreases a baby's risk of being born too early (prematurely), or being born smaller than expected (small for gestational age). Any underlying medical conditions you may have that could pose a risk during your pregnancy are discussed during prenatal care visits. You will also be monitored regularly for any new conditions that may arise during your pregnancy so they can be treated quickly and effectively. WHAT HAPPENS DURING PRENATAL CARE VISITS? Prenatal care visits may include the following: Discussion Tell your health care provider about any new signs or symptoms you have experienced since your last visit. These might include:  Nausea or vomiting.  Increased or decreased level of energy.  Difficulty sleeping.  Back or leg pain.  Weight changes.  Frequent urination.  Shortness of breath with physical activity.  Changes in your skin, such as the development of a rash or itchiness.  Vaginal discharge or bleeding.  Feelings of excitement or nervousness.  Changes in  your baby's movements.  You may want to write down any questions or topics you want to discuss with your health care provider and bring them with you to your appointment. Examination During your first prenatal care visit, you will likely have a complete physical exam. Your health care provider will often examine your vagina, cervix, and the position of your uterus, as well as check your heart, lungs, and other body systems. As your pregnancy progresses, your health care provider will measure the size of your uterus and your baby's position inside your uterus. He or she may also examine you for early signs of labor. Your prenatal visits may also include checking your blood pressure and, after about 10-12 weeks of  pregnancy, listening to your baby's heartbeat. Testing Regular testing often includes:  Urinalysis. This checks your urine for glucose, protein, or signs of infection.  Blood count. This checks the levels of white and red blood cells in your body.  Tests for sexually transmitted infections (STIs). Testing for STIs at the beginning of pregnancy is routinely done and is required in many states.  Antibody testing. You will be checked to see if you are immune to certain illnesses, such as rubella, that can affect a developing fetus.  Glucose screen. Around 24-28 weeks of pregnancy, your blood glucose level will be checked for signs of gestational diabetes. Follow-up tests may be recommended.  Group B strep. This is a bacteria that is commonly found inside a woman's vagina. This test will inform your health care provider if you need an antibiotic to reduce the amount of this bacteria in your body prior to labor and childbirth.  Ultrasound. Many pregnant women undergo an ultrasound screening around 18-20 weeks of pregnancy to evaluate the health of the fetus and check for any developmental abnormalities.  HIV (human immunodeficiency virus) testing. Early in your pregnancy, you will be screened for HIV. If you are at high risk for HIV, this test may be repeated during your third trimester of pregnancy.  You may be offered other testing based on your age, personal or family medical history, or other factors. HOW OFTEN SHOULD I PLAN TO SEE MY HEALTH CARE PROVIDER FOR PRENATAL CARE? Your prenatal care check-up schedule depends on any medical conditions you have before, or develop during, your pregnancy. If you do not have any underlying medical conditions, you will likely be seen for checkups:  Monthly, during the first 6 months of pregnancy.  Twice a month during months 7 and 8 of pregnancy.  Weekly starting in the 9th month of pregnancy and until delivery.  If you develop signs of early labor  or other concerning signs or symptoms, you may need to see your health care provider more often. Ask your health care provider what prenatal care schedule is best for you. WHAT CAN I DO TO KEEP MYSELF AND MY BABY AS HEALTHY AS POSSIBLE DURING MY PREGNANCY?  Take a prenatal vitamin containing 400 micrograms (0.4 mg) of folic acid every day. Your health care provider may also ask you to take additional vitamins such as iodine, vitamin D, iron, copper, and zinc.  Take 1500-2000 mg of calcium daily starting at your 20th week of pregnancy until you deliver your baby.  Make sure you are up to date on your vaccinations. Unless directed otherwise by your health care provider: ? You should receive a tetanus, diphtheria, and pertussis (Tdap) vaccination between the 27th and 36th week of your pregnancy, regardless of when your last Tdap immunization   occurred. This helps protect your baby from whooping cough (pertussis) after he or she is born. ? You should receive an annual inactivated influenza vaccine (IIV) to help protect you and your baby from influenza. This can be done at any point during your pregnancy.  Eat a well-rounded diet that includes: ? Fresh fruits and vegetables. ? Lean proteins. ? Calcium-rich foods such as milk, yogurt, hard cheeses, and dark, leafy greens. ? Whole grain breads.  Do noteat seafood high in mercury, including: ? Swordfish. ? Tilefish. ? Shark. ? King mackerel. ? More than 6 oz tuna per week.  Do not eat: ? Raw or undercooked meats or eggs. ? Unpasteurized foods, such as soft cheeses (brie, blue, or feta), juices, and milks. ? Lunch meats. ? Hot dogs that have not been heated until they are steaming.  Drink enough water to keep your urine clear or pale yellow. For many women, this may be 10 or more 8 oz glasses of water each day. Keeping yourself hydrated helps deliver nutrients to your baby and may prevent the start of pre-term uterine contractions.  Do not use  any tobacco products including cigarettes, chewing tobacco, or electronic cigarettes. If you need help quitting, ask your health care provider.  Do not drink beverages containing alcohol. No safe level of alcohol consumption during pregnancy has been determined.  Do not use any illegal drugs. These can harm your developing baby or cause a miscarriage.  Ask your health care provider or pharmacist before taking any prescription or over-the-counter medicines, herbs, or supplements.  Limit your caffeine intake to no more than 200 mg per day.  Exercise. Unless told otherwise by your health care provider, try to get 30 minutes of moderate exercise most days of the week. Do not  do high-impact activities, contact sports, or activities with a high risk of falling, such as horseback riding or downhill skiing.  Get plenty of rest.  Avoid anything that raises your body temperature, such as hot tubs and saunas.  If you own a cat, do not empty its litter box. Bacteria contained in cat feces can cause an infection called toxoplasmosis. This can result in serious harm to the fetus.  Stay away from chemicals such as insecticides, lead, mercury, and cleaning or paint products that contain solvents.  Do not have any X-rays taken unless medically necessary.  Take a childbirth and breastfeeding preparation class. Ask your health care provider if you need a referral or recommendation.  This information is not intended to replace advice given to you by your health care provider. Make sure you discuss any questions you have with your health care provider. Document Released: 05/29/2003 Document Revised: 10/29/2015 Document Reviewed: 08/10/2013 Elsevier Interactive Patient Education  2017 [employer]  

## 2017-06-18 ENCOUNTER — Other Ambulatory Visit: Payer: Self-pay | Admitting: Obstetrics & Gynecology

## 2017-06-18 LAB — RPR+RH+ABO+RUB AB+AB SCR+CB...
ANTIBODY SCREEN: NEGATIVE
HEMATOCRIT: 31.7 % — AB (ref 34.0–46.6)
HIV SCREEN 4TH GENERATION: NONREACTIVE
Hemoglobin: 10.3 g/dL — ABNORMAL LOW (ref 11.1–15.9)
Hepatitis B Surface Ag: NEGATIVE
MCH: 26.7 pg (ref 26.6–33.0)
MCHC: 32.5 g/dL (ref 31.5–35.7)
MCV: 82 fL (ref 79–97)
PLATELETS: 359 10*3/uL (ref 150–379)
RBC: 3.86 x10E6/uL (ref 3.77–5.28)
RDW: 15.7 % — AB (ref 12.3–15.4)
RPR: NONREACTIVE
Rh Factor: POSITIVE
Rubella Antibodies, IGG: 2.57 index (ref >0.99)
Varicella zoster IgG: 2079 index (ref >165)
WBC: 7.8 10*3/uL (ref 3.4–10.8)

## 2017-06-18 LAB — GLUCOSE, 1 HOUR GESTATIONAL: GESTATIONAL DIABETES SCREEN: 127 mg/dL (ref 65–139)

## 2017-06-18 NOTE — Progress Notes (Signed)
Review of ULTRASOUND.    I have personally reviewed images and report of recent ultrasound done at [facility].    Plan of management to be discussed with patient/ CNM [place]   Repeat US 2 weeks to further confirm viability. EDC adjusted (02/09/18)  Barnett Applebaum, MD, [facility], [facility] 06/18/2017  10:41 AM

## 2017-06-19 ENCOUNTER — Encounter: Payer: Self-pay | Admitting: Advanced Practice Midwife

## 2017-06-19 LAB — URINE CULTURE: Organism ID, Bacteria: NO GROWTH

## 2017-06-21 ENCOUNTER — Encounter: Payer: Self-pay | Admitting: Advanced Practice Midwife

## 2017-06-22 NOTE — Telephone Encounter (Signed)
Pt calling regarding genetic/gender testing. Her insurance doesn't cover it, but she spoke w/someone who states they do offer it at a reduced price. She has an email that states specific information she needs in order to proceed with this. Requesting call back to be scheduled for lab work & obtain necessary information. Cb#[phone].

## 2017-06-30 ENCOUNTER — Other Ambulatory Visit: Payer: Self-pay | Admitting: Advanced Practice Midwife

## 2017-06-30 DIAGNOSIS — Z0189 Encounter for other specified special examinations: Secondary | ICD-10-CM

## 2017-07-01 ENCOUNTER — Ambulatory Visit (INDEPENDENT_AMBULATORY_CARE_PROVIDER_SITE_OTHER): Payer: 59 | Admitting: Obstetrics and Gynecology

## 2017-07-01 ENCOUNTER — Ambulatory Visit (INDEPENDENT_AMBULATORY_CARE_PROVIDER_SITE_OTHER): Payer: 59

## 2017-07-01 VITALS — BP 102/66 | Wt 187.0 lb

## 2017-07-01 DIAGNOSIS — Z0189 Encounter for other specified special examinations: Secondary | ICD-10-CM

## 2017-07-01 DIAGNOSIS — K2100 Gastro-esophageal reflux disease with esophagitis, without bleeding: Secondary | ICD-10-CM

## 2017-07-01 DIAGNOSIS — Z362 Encounter for other antenatal screening follow-up: Secondary | ICD-10-CM | POA: Diagnosis not present

## 2017-07-01 DIAGNOSIS — Z8632 Personal history of gestational diabetes: Secondary | ICD-10-CM

## 2017-07-01 DIAGNOSIS — O09299 Supervision of pregnancy with other poor reproductive or obstetric history, unspecified trimester: Secondary | ICD-10-CM

## 2017-07-01 DIAGNOSIS — O10919 Unspecified pre-existing hypertension complicating pregnancy, unspecified trimester: Secondary | ICD-10-CM

## 2017-07-01 DIAGNOSIS — I10 Essential (primary) hypertension: Secondary | ICD-10-CM

## 2017-07-01 DIAGNOSIS — I4891 Unspecified atrial fibrillation: Secondary | ICD-10-CM

## 2017-07-01 DIAGNOSIS — Z6835 Body mass index (BMI) 35.0-35.9, adult: Secondary | ICD-10-CM

## 2017-07-01 DIAGNOSIS — O99210 Obesity complicating pregnancy, unspecified trimester: Secondary | ICD-10-CM

## 2017-07-01 DIAGNOSIS — O0990 Supervision of high risk pregnancy, unspecified, unspecified trimester: Secondary | ICD-10-CM

## 2017-07-01 MED ORDER — SUCRALFATE 1 GM/10ML PO SUSP: 1.0000 g | Freq: Three times a day (TID) | ORAL | 3 refills | Status: DC

## 2017-07-01 MED ORDER — PRENATAL VITAMINS 0.8 MG PO TABS: 1.0000 | ORAL_TABLET | Freq: Every day | ORAL | 12 refills | Status: DC

## 2017-07-01 MED ORDER — PYRIDOXINE HCL 25 MG PO TABS: 25.0000 mg | ORAL_TABLET | Freq: Four times a day (QID) | ORAL | 3 refills | Status: DC

## 2017-07-01 MED ORDER — DOXYLAMINE SUCCINATE (SLEEP) 25 MG PO TABS: 25.0000 mg | ORAL_TABLET | Freq: Every day | ORAL | 3 refills | Status: DC

## 2017-07-01 NOTE — Progress Notes (Signed)
Routine Prenatal Care Visit  Subjective  Debra Whitney is a 32 y.o. G5P3013 at [redacted]w[redacted]d being seen today for ongoing prenatal care.  She is currently monitored for the following issues for this high-risk pregnancy and has Atrial fibrillation with RVR ([place]); New onset atrial fibrillation ([place]); Essential hypertension; Obesity; Depression, recurrent ([place]); Nonintractable headache; Supervision of high risk pregnancy, antepartum; Obesity affecting pregnancy, antepartum; Chronic hypertension during pregnancy, antepartum; History of gestational diabetes in prior pregnancy, currently pregnant; and BMI 35.0-35.9,adult on their problem list.  ----------------------------------------------------------------------------------- Patient reports nausea.   Contractions: Not present. Vag. Bleeding: None.   . Denies leaking of fluid.  ----------------------------------------------------------------------------------- The following portions of the patient's history were reviewed and updated as appropriate: allergies, current medications, past family history, past medical history, past social history, past surgical history and problem list. Problem list updated.   Objective  Blood pressure 102/66, weight 187 lb (84.8 kg), last menstrual period 04/24/2017, unknown if currently breastfeeding. Pregravid weight 186 lb (84.4 kg) Total Weight Gain 1 lb (0.454 kg) Urinalysis: Urine Protein: Negative Urine Glucose: Negative  Fetal Status: Fetal Heart Rate (bpm): 167         General:  Alert, oriented and cooperative. Patient is in no acute distress.  Skin: Skin is warm and dry. No rash noted.   Cardiovascular: Normal heart rate noted  Respiratory: Normal respiratory effort, no problems with respiration noted  Abdomen: Soft, gravid, appropriate for gestational age. Pain/Pressure: Absent     Pelvic:  Cervical exam deferred        Extremities: Normal range of motion.     ental Status: Normal mood and affect. Normal  behavior. Normal judgment and thought content.     Assessment   32 y.o. [id number] at [redacted]w[redacted]d by  02/09/2018, by Ultrasound presenting for routine prenatal visit  Plan   pregnancy Problems (from 06/17/17 to present)    Problem Noted Resolved   History of gestational diabetes in prior pregnancy, currently pregnant 06/17/2017 by Rod Can, CNM No       Preterm labor symptoms and general obstetric precautions including but not limited to vaginal bleeding, contractions, leaking of fluid and fetal movement were reviewed in detail with the patient. Please refer to After Visit Summary for other counseling recommendations.   Discussed antenatal testing, patient is considering Materniti 21 testing vs panorama. Will need to pay out of pocket for test. Will return in 2 weeks for lab drawl Will start B6 and Unisom for nausea Discussed acid reflux medications with patient and risks associated with omeprazole. Will start patient on Carafate as well. Return in about 2 weeks (around 07/15/2017) for [place] and [place] .   Per Uptodate:  - Initial management of gastroesophageal reflux disease (GERD) in pregnancy consists of lifestyle and dietary modification (eg, elevation of the head end of the bed, avoidance of dietary triggers). In patients with persistent symptoms, pharmacologic therapy should begin with antacids followed by sucralfate. In patients who fail to respond, similar to nonpregnant patients, histamine 2 receptor antagonists (H2RAs) and then proton pump inhibitors (PPIs) should be used to control symptoms (table 1). (See 'Mild and intermittent symptoms' above.)  Most antacids are considered safe in pregnancy and are compatible with breastfeeding [43]. However, antacids containing sodium bicarbonate and magnesium trisilicate should be avoided in pregnancy [44]. (See 'Antacids' above.) Sucralfate is likely safe during pregnancy and lactation because it is poorly absorbed [45]. In patients who continue to have  symptoms of GERD despite antacids, we suggest sucralfate (1  g orally three times daily) [46]. While all H2RAs appear to be safe in pregnancy, in patients with continued GERD symptoms on sucralfate, we suggest ranitidine or cimetidine as they have the most safety data available [47]. Ranitidine and cimetidine are concentrated in breast milk, but are compatible with breastfeeding. (See 'Surface agents and alginates' above.) Experience with PPIs is more limited compared with H2RAs, but suggests that PPIs are probably safe in pregnancy [48,49]. Limited data are available on the secretion of PPIs in breast milk. Omeprazole and pantoprazole are secreted in low concentrations in breast milk [50,51]. However, most of this is likely destroyed by gastric acid in the infant's stomach [52]. In pregnant patients with GERD symptoms despite H2RAs, we suggest the use of omeprazole, lansoprazole, or pantoprazole rather than other PPIs, as they have been more widely used in pregnancy. The safety of PPIs in pregnancy has been evaluated in several studies. A meta-analysis of seven observational studies found no significant difference in the risk for major congenital birth defects, spontaneous abortions, or preterm delivery among 75 women exposed to PPIs during pregnancy as compared with 133,410 who were not exposed to PPIs [48]. A subsequent observational study that evaluated PPI exposure in early pregnancy also found no increase in the risk of major birth defects in 3651 infants exposed to PPIs during the first trimester, as compared with [phone] infants who had not been exposed [49]. (See 'Proton pump inhibitors' above.) Upper endoscopy should be performed during pregnancy only if there is a strong indication (eg, significant gastrointestinal bleeding). When possible, endoscopy should be postponed until the second trimester [53]. Obstetrical staff should be closely involved and the degree of maternal and fetal monitoring should  be individualized. Recommendations for procedural sedation for endoscopy in pregnant and nursing women are discussed separately. (See "Overview of procedural sedation for gastrointestinal endoscopy", section on 'Pregnancy and lactation'.)  Adrian Prows MD 07/01/17 5:58 PM

## 2017-07-12 DIAGNOSIS — J069 Acute upper respiratory infection, unspecified: Secondary | ICD-10-CM | POA: Diagnosis not present

## 2017-07-13 ENCOUNTER — Encounter: Payer: Self-pay | Admitting: Internal Medicine

## 2017-07-13 ENCOUNTER — Ambulatory Visit (INDEPENDENT_AMBULATORY_CARE_PROVIDER_SITE_OTHER): Payer: 59 | Admitting: Internal Medicine

## 2017-07-13 VITALS — BP 142/90 | HR 100 | Temp 98.2°F | Ht 61.0 in | Wt 186.0 lb

## 2017-07-13 DIAGNOSIS — J029 Acute pharyngitis, unspecified: Secondary | ICD-10-CM | POA: Diagnosis not present

## 2017-07-13 DIAGNOSIS — H6690 Otitis media, unspecified, unspecified ear: Secondary | ICD-10-CM

## 2017-07-13 DIAGNOSIS — J069 Acute upper respiratory infection, unspecified: Secondary | ICD-10-CM | POA: Diagnosis not present

## 2017-07-13 DIAGNOSIS — R300 Dysuria: Secondary | ICD-10-CM | POA: Diagnosis not present

## 2017-07-13 LAB — URINALYSIS, ROUTINE W REFLEX MICROSCOPIC
BILIRUBIN URINE: NEGATIVE
HGB URINE DIPSTICK: NEGATIVE
Leukocytes, UA: NEGATIVE
Nitrite: NEGATIVE
Specific Gravity, Urine: 1.02 (ref 1.000–1.030)
Urine Glucose: NEGATIVE
Urobilinogen, UA: 0.2 (ref 0.0–1.0)
pH: 7 (ref 5.0–8.0)

## 2017-07-13 LAB — POC INFLUENZA A&B (BINAX/QUICKVUE)
INFLUENZA A, POC: NEGATIVE
INFLUENZA B, POC: NEGATIVE

## 2017-07-13 MED ORDER — AMOXICILLIN-POT CLAVULANATE 875-125 MG PO TABS
1.0000 | ORAL_TABLET | Freq: Two times a day (BID) | ORAL | 0 refills | Status: DC
Start: 2017-07-13 — End: 2017-08-05

## 2017-07-13 NOTE — Patient Instructions (Signed)
Follow up in 1 week or sooner if not better   Upper Respiratory Infection, Adult Most upper respiratory infections (URIs) are caused by a virus. A URI affects the nose, throat, and upper air passages. The most common type of URI is often called "the common cold." Follow these instructions at home:  Take medicines only as told by your doctor.  Gargle warm saltwater or take cough drops to comfort your throat as told by your doctor.  Use a warm mist humidifier or inhale steam from a shower to increase air moisture. This may make it easier to breathe.  Drink enough fluid to keep your pee (urine) clear or pale yellow.  Eat soups and other clear broths.  Have a healthy diet.  Rest as needed.  Go back to work when your fever is gone or your doctor says it is okay. ? You may need to stay home longer to avoid giving your URI to others. ? You can also wear a face mask and wash your hands often to prevent spread of the virus.  Use your inhaler more if you have asthma.  Do not use any tobacco products, including cigarettes, chewing tobacco, or electronic cigarettes. If you need help quitting, ask your doctor. Contact a doctor if:  You are getting worse, not better.  Your symptoms are not helped by medicine.  You have chills.  You are getting more short of breath.  You have brown or red mucus.  You have yellow or brown discharge from your nose.  You have pain in your face, especially when you bend forward.  You have a fever.  You have puffy (swollen) neck glands.  You have pain while swallowing.  You have white areas in the back of your throat. Get help right away if:  You have very bad or constant: ? Headache. ? Ear pain. ? Pain in your forehead, behind your eyes, and over your cheekbones (sinus pain). ? Chest pain.  You have long-lasting (chronic) lung disease and any of the following: ? Wheezing. ? Long-lasting cough. ? Coughing up blood. ? A change in your usual  mucus.  You have a stiff neck.  You have changes in your: ? Vision. ? Hearing. ? Thinking. ? Mood. This information is not intended to replace advice given to you by your health care provider. Make sure you discuss any questions you have with your health care provider. Document Released: 11/12/2007 Document Revised: 01/27/2016 Document Reviewed: 08/31/2013 Elsevier Interactive Patient Education  2018 [employer].

## 2017-07-13 NOTE — Progress Notes (Signed)
Pre visit review using our clinic review tool, if applicable. No additional management support is needed unless otherwise documented below in the visit note. 

## 2017-07-13 NOTE — Progress Notes (Signed)
Chief Complaint  Patient presents with  . Follow-up   Sick visit  Sx's started Thursday/Friday with cough with yellow phelgm worse at night, congestion, chest tightness. She has been running fever 99 to 101 and taking Tylenol. No body aches + chills and sore throat. Her daughter had cold last week with cough and congestion. She went to urgent care yesterday and strep was negative and was told she had fluid in her left ear but today her right ear is hurting with pain.   She is [redacted] weeks pregnant so only medication she has tried is tylenol.   She c/o dysuria new and change in urine color   Review of Systems  Constitutional: Positive for chills and fever.  HENT: Positive for ear pain and sore throat.   Respiratory: Positive for cough.   Cardiovascular: Negative for chest pain.  Genitourinary: Positive for dysuria.  Musculoskeletal: Negative for myalgias.  Skin: Negative for rash.   Past Medical History:  Diagnosis Date  . Atrial fibrillation ([place])    a. diagnosed on 10/20/2016; chads2vasc => at least 2 (HTN, sex category), possibly 3 given history of gestational diabetes  . Endometriosis   . GERD (gastroesophageal reflux disease)   . Gestational diabetes   . Hypertension   . Menorrhagia   . Migraine   . Obesity    Past Surgical History:  Procedure Laterality Date  . DILATION AND CURETTAGE OF UTERUS    . endometrial surgery    . HERNIA REPAIR    . HYSTEROSCOPY W/D&C  11/29/2015   Procedure: DILATATION AND CURETTAGE /HYSTEROSCOPY;  Surgeon: Malachy Mood, MD;  Location: [facility] ORS;  Service: Gynecology;;  . LAPAROSCOPY N/A 11/29/2015   Procedure: LAPAROSCOPY DIAGNOSTIC;  Surgeon: Malachy Mood, MD;  Location: [facility] ORS;  Service: Gynecology;  Laterality: N/A;  . NASAL SINUS SURGERY    . WISDOM TOOTH EXTRACTION     Family History  Problem Relation Age of Onset  . Diabetes Mother   . Hypertension Mother   . CVA Maternal Grandmother    Social History   Socioeconomic History   . Marital status: Married    Spouse name: Not on file  . Number of children: Not on file  . Years of education: Not on file  . Highest education level: Not on file  Social Needs  . Financial resource strain: Not on file  . Food insecurity - worry: Not on file  . Food insecurity - inability: Not on file  . Transportation needs - medical: Not on file  . Transportation needs - non-medical: Not on file  Occupational History  . Not on file  Tobacco Use  . Smoking status: Never Smoker  . Smokeless tobacco: Never Used  Substance and Sexual Activity  . Alcohol use: Yes    Comment: occasionally  . Drug use: No  . Sexual activity: Yes    Birth control/protection: None  Other Topics Concern  . 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             Current Meds  Medication Sig  . doxylamine, Sleep, (UNISOM) 25 MG tablet Take 1 tablet (25 mg total) by mouth at bedtime.  Marland Kitchen labetalol (NORMODYNE) 100 MG tablet Take 1 tablet (100 mg total) by mouth daily.  . Prenatal Multivit-Min-Fe-FA (PRENATAL VITAMINS) 0.8 MG tablet Take 1 tablet by mouth daily.  . Prenatal Vit-Fe Fumarate-FA (MULTIVITAMIN-PRENATAL)  27-0.8 MG TABS tablet Take 1 tablet by mouth daily at 12 noon.  . pyridOXINE (VITAMIN B-6) 25 MG tablet Take 1 tablet (25 mg total) by mouth QID.  Marland Kitchen ranitidine (ZANTAC) 150 MG tablet Take 150 mg by mouth 2 (two) times daily.  . sucralfate (CARAFATE) 1 GM/10ML suspension Take 10 mLs (1 g total) by mouth 3 (three) times daily.   Allergies  Allergen Reactions  . Percocet [Oxycodone-Acetaminophen] Nausea And Vomiting   Recent Results (from the past 2160 hour(s))  RPR+Rh+ABO+Rub Ab+Ab Scr+CB...     Status: Abnormal   Collection Time: 06/17/17  4:02 PM  Result Value Ref Range   HIV Screen 4th Generation wRfx Non Reactive Non Reactive   Varicella zoster IgG 2,079 Immune >165 index    Comment:                                 Negative          <135                                Equivocal    135 - 165                                Positive          >165 A positive result generally indicates exposure to the pathogen or administration of specific immunoglobulins, but it is not indication of active infection or stage of disease.    Hepatitis B Surface Ag Negative Negative   RPR Ser Ql Non Reactive Non Reactive   Rubella Antibodies, IGG 2.57 Immune >0.99 index    Comment:                                 Non-immune       <0.90                                 Equivocal  0.90 - 0.99                                 Immune           >0.99    ABO Grouping A    Rh Factor Positive     Comment: Please note: Prior records for this patient's ABO / Rh type are not available for additional verification.    Antibody Screen Negative Negative   WBC 7.8 3.4 - 10.8 x10E3/uL   RBC 3.86 3.77 - 5.28 x10E6/uL   Hemoglobin 10.3 (L) 11.1 - 15.9 g/dL   Hematocrit 31.7 (L) 34.0 - 46.6 %   MCV 82 79 - 97 fL   MCH 26.7 26.6 - 33.0 pg   MCHC 32.5 31.5 - 35.7 g/dL   RDW 15.7 (H) 12.3 - 15.4 %   Platelets 359 150 - 379 x10E3/uL  Glucose, 1 hour gestational     Status: None   Collection Time: 06/17/17  4:02 PM  Result Value Ref Range   Gestational Diabetes Screen 127 65 - 139 mg/dL    Comment: According to ADA, a glucose threshold of >139 mg/dL after 50-gram  load identifies approximately 80% of women with gestational diabetes mellitus, while the sensitivity is further increased to approximately 90% by a threshold of >129 mg/dL.   Urine Culture     Status: None   Collection Time: 06/17/17  4:10 PM  Result Value Ref Range   Urine Culture, Routine Final report    Organism ID, Bacteria No growth    Objective  Body mass index is 35.14 kg/m. Wt Readings from Last 3 Encounters:  07/13/17 186 lb (84.4 kg)  07/01/17 187 lb (84.8 kg)  06/17/17 194 lb (88 kg)   Temp Readings from Last 3 Encounters:  07/13/17 98.2 F (36.8  C) (Oral)  04/02/17 98.2 F (36.8 C) (Oral)  12/23/16 98.6 F (37 C) (Oral)   BP Readings from Last 3 Encounters:  07/13/17 (!) 142/90  07/01/17 102/66  06/17/17 118/74   Pulse Readings from Last 3 Encounters:  07/13/17 100  04/02/17 85  12/23/16 81   O2 sat room air 97% Physical Exam  Constitutional: She is oriented to person, place, and time and well-developed, well-nourished, and in no distress.  HENT:  Head: Normocephalic and atraumatic.  Right Ear: There is tenderness. Tympanic membrane is injected.  Mouth/Throat: Oropharynx is clear and moist and mucous membranes are normal.  Mild redness right ear  B/l fluid both ears   Eyes: Conjunctivae are normal. Pupils are equal, round, and reactive to light.  Cardiovascular: Normal rate and regular rhythm.  Pulmonary/Chest: Effort normal and breath sounds normal.  Neurological: She is alert and oriented to person, place, and time. Gait normal.  Skin: Skin is warm, dry and intact.  Psychiatric: Mood, memory, affect and judgment normal.  Nursing note and vitals reviewed.   Assessment   1. URI with right OM, and sore throat. Strep neg yesterday, flu negative today  2. Dysuria  3. HM Plan  1. Supportive care NS OTC antihistamine OTC Robitussin DM  Prn Tylenol  Augmentin bid x 1 week for ear pain  Caution with meds 2/2 pt is [redacted] weeks pregnant reviewed with pharmacist [facility] meds ok for pregnancy and also reviewed with pt  2. Check UA today  If appears dirty already on Augmentin  3.  Had flu shot   Provider: Dr. Olivia Mackie McLean-Scocuzza-Internal Medicine

## 2017-07-15 ENCOUNTER — Other Ambulatory Visit: Payer: 59

## 2017-07-15 ENCOUNTER — Ambulatory Visit (INDEPENDENT_AMBULATORY_CARE_PROVIDER_SITE_OTHER): Payer: 59 | Admitting: Maternal Newborn

## 2017-07-15 ENCOUNTER — Encounter: Payer: Self-pay | Admitting: Maternal Newborn

## 2017-07-15 VITALS — BP 130/80 | Wt 190.0 lb

## 2017-07-15 DIAGNOSIS — Z1379 Encounter for other screening for genetic and chromosomal anomalies: Secondary | ICD-10-CM

## 2017-07-15 DIAGNOSIS — R300 Dysuria: Secondary | ICD-10-CM | POA: Diagnosis not present

## 2017-07-15 DIAGNOSIS — K219 Gastro-esophageal reflux disease without esophagitis: Secondary | ICD-10-CM

## 2017-07-15 DIAGNOSIS — O0990 Supervision of high risk pregnancy, unspecified, unspecified trimester: Secondary | ICD-10-CM

## 2017-07-15 DIAGNOSIS — Z3A10 10 weeks gestation of pregnancy: Secondary | ICD-10-CM

## 2017-07-15 LAB — POCT URINALYSIS DIPSTICK
BILIRUBIN UA: NEGATIVE
GLUCOSE UA: NEGATIVE
Ketones, UA: NEGATIVE
Nitrite, UA: NEGATIVE
Protein, UA: NEGATIVE
RBC UA: NEGATIVE
SPEC GRAV UA: 1.02 (ref 1.010–1.025)
Urobilinogen, UA: NEGATIVE E.U./dL — AB
pH, UA: 6 (ref 5.0–8.0)

## 2017-07-15 MED ORDER — OMEPRAZOLE 20 MG PO CPDR: 20.0000 mg | DELAYED_RELEASE_CAPSULE | Freq: Two times a day (BID) | ORAL | 11 refills | Status: DC

## 2017-07-15 NOTE — Progress Notes (Signed)
Routine Prenatal Care Visit  Subjective  Debra Whitney is a 32 y.o. [id number] at [redacted]w[redacted]d being seen today for ongoing prenatal care.  She is currently monitored for the following issues for this high-risk pregnancy and has Atrial fibrillation with RVR ([place]); New onset atrial fibrillation ([place]); Essential hypertension; Obesity; Depression, recurrent ([place]); Nonintractable headache; Supervision of high risk pregnancy, antepartum; Obesity affecting pregnancy, antepartum; Chronic hypertension during pregnancy, antepartum; History of gestational diabetes in prior pregnancy, currently pregnant; BMI 35.0-35.9,adult; and Gastroesophageal reflux disease on their problem list.  ----------------------------------------------------------------------------------- Patient reports heartburn has not improved, has URI symptoms, improving since she started Augmentin after seeing PCP. Contractions: Not present. Vag. Bleeding: None. Denies leaking of fluid.  ----------------------------------------------------------------------------------- The following portions of the patient's history were reviewed and updated as appropriate: allergies, current medications, past family history, past medical history, past social history, past surgical history and problem list. Problem list updated.   Objective  Blood pressure 130/80, weight 190 lb (86.2 kg), last menstrual period 04/24/2017. Pregravid weight 186 lb (84.4 kg) Total Weight Gain 4 lb (1.814 kg) Urinalysis: Urine Protein: Negative Urine Glucose: Negative  Fetal Status:           General:  Alert, oriented and cooperative. Patient is in no acute distress.  Skin: Skin is warm and dry. No rash noted.   Cardiovascular: Normal heart rate noted  Respiratory: Normal respiratory effort, no problems with respiration noted  Abdomen: Soft, gravid, appropriate for gestational age. Pain/Pressure: Absent     Pelvic:  Cervical exam deferred        Extremities: Normal range  of motion.     Mental Status: Normal mood and affect. Normal behavior. Normal judgment and thought content.     Assessment   32 y.o. [id number] at [redacted]w[redacted]d, EDD 02/09/2018 by Ultrasound presenting for routine prenatal visit.  Plan   pregnancy Problems (from 06/17/17 to present)    Problem Noted Resolved   Supervision of high risk pregnancy, antepartum 06/17/2017 by Rod Can, CNM No   Overview Addendum 07/01/2017  4:59 PM by Homero Fellers, MD    Clinic [facility] Prenatal Labs  Dating  Blood type: A/Positive/-- (01/09 1602)   Genetic Screen 1 Screen:    AFP:     Quad:     NIPS: Antibody:Negative (01/09 1602)  Anatomic [place]  Rubella: 2.57 (01/09 1602) Varicella: Immune  GTT Early:               Third trimester:  RPR: Non Reactive (01/09 1602)   Rhogam  HBsAg: Negative (01/09 1602)   TDaP vaccine                        Flu Shot: 02/10/2017 HIV: Non Reactive (01/09 1602)   Baby Food                                GBS:   Contraception  Pap: 2016 normal  CBB     CS/VBAC NA   Support Person Husband Legrand Como          History of gestational diabetes in prior pregnancy, currently pregnant 06/17/2017 by Rod Can, CNM No    There was a misunderstanding about necessity of ultrasound today to complete genetic screening. Clarified screening modalities and patient elected to have MaterniTi 21 done today.   First blood pressure was taken when patient was in discussion with staff about plan  of care, using a cuff that was not the correct size for her. Repeat BP with appropriate cuff at rest was 130/80.  Urine culture sent, UA showed trace leukocytes and she had some burning earlier in the week that has subsided. Will notify about results.  No relief from heartburn with GERD symptoms with Zantac, and Carafate is not affordable. Will try omeprazole.  Preterm labor symptoms and general obstetric precautions  were reviewed in detail with the patient.  Return in about 4 weeks (around 08/12/2017)  for ROB.  Avel Sensor, CNM 07/15/2017  4:05 PM

## 2017-07-15 NOTE — Progress Notes (Signed)
C/o mix up about u/s and genetic testing.  Dx'd c URI - given amox.  Was told urine was 'dirty'. See u/a.

## 2017-07-16 ENCOUNTER — Other Ambulatory Visit: Payer: Self-pay | Admitting: Maternal Newborn

## 2017-07-16 ENCOUNTER — Encounter: Payer: Self-pay | Admitting: Maternal Newborn

## 2017-07-16 ENCOUNTER — Telehealth: Payer: Self-pay

## 2017-07-16 DIAGNOSIS — B373 Candidiasis of vulva and vagina: Secondary | ICD-10-CM

## 2017-07-16 DIAGNOSIS — B3731 Acute candidiasis of vulva and vagina: Secondary | ICD-10-CM

## 2017-07-16 MED ORDER — TERCONAZOLE 0.4 % VA CREA: 1.0000 | TOPICAL_CREAM | Freq: Every day | VAGINAL | 1 refills | Status: AC

## 2017-07-16 NOTE — Telephone Encounter (Signed)
Please let her know that we treat with topical agents in pregnancy; I sent an Rx for terconazole to her pharmacy and she should use it for 7 days.  Thanks! Jovita Kussmaul

## 2017-07-16 NOTE — Telephone Encounter (Signed)
Pt aware.

## 2017-07-16 NOTE — Telephone Encounter (Signed)
Pt stating she was seen yesterday by a midwife. She has a UTI and is currently on ABX. She has been having a little vaginal burning and irritation starting last night and this morning. IS Diflucan ok to take, if so can she get it called in. CB# [phone]

## 2017-07-16 NOTE — Progress Notes (Signed)
Patient called with vaginal burning/irritation that started yesterday, currently on antibiotics for ear infection. Rx sent for terconazole and patient notified.  Avel Sensor, CNM 07/16/2017  10:20 AM

## 2017-07-17 LAB — URINE CULTURE: ORGANISM ID, BACTERIA: NO GROWTH

## 2017-07-19 LAB — MATERNIT 21 PLUS CORE, BLOOD
CHROMOSOME 13: NEGATIVE
CHROMOSOME 21: NEGATIVE
Chromosome 18: NEGATIVE
Y Chromosome: DETECTED

## 2017-07-20 ENCOUNTER — Encounter: Payer: Self-pay | Admitting: Maternal Newborn

## 2017-07-22 ENCOUNTER — Encounter: Payer: Self-pay | Admitting: Maternal Newborn

## 2017-07-22 ENCOUNTER — Telehealth: Payer: Self-pay | Admitting: Family

## 2017-07-22 NOTE — Telephone Encounter (Signed)
Called patient and advised she should call her OB-GYN, for Tami-flu.

## 2017-07-22 NOTE — Telephone Encounter (Signed)
Copied from [facility] [id number]. Topic: Quick Communication - See Telephone Encounter >> Jul 22, 2017  2:08 PM Vernona Rieger wrote: CRM for notification. See Telephone encounter for:   07/22/17.   Patient said that both of her kids were positive for the flu, patient wants to know if tami-flu is safe to take pregnant. She is Pregnant @ 11 weeks. If it is safe, she is requesting a script of tami-flu [place] [phone]   Call back number [phone]

## 2017-07-22 NOTE — Telephone Encounter (Signed)
Noted . Tamiflu is safe in pregnancy to my knowledge however I would be more comfortable with her getting advise from OB.  thanks

## 2017-07-26 DIAGNOSIS — Z885 Allergy status to narcotic agent status: Secondary | ICD-10-CM | POA: Diagnosis not present

## 2017-07-26 DIAGNOSIS — E860 Dehydration: Secondary | ICD-10-CM | POA: Diagnosis not present

## 2017-07-26 DIAGNOSIS — J101 Influenza due to other identified influenza virus with other respiratory manifestations: Secondary | ICD-10-CM | POA: Diagnosis not present

## 2017-07-26 DIAGNOSIS — I10 Essential (primary) hypertension: Secondary | ICD-10-CM | POA: Diagnosis not present

## 2017-07-26 DIAGNOSIS — R0789 Other chest pain: Secondary | ICD-10-CM | POA: Diagnosis not present

## 2017-07-26 DIAGNOSIS — Z888 Allergy status to other drugs, medicaments and biological substances status: Secondary | ICD-10-CM | POA: Diagnosis not present

## 2017-07-27 ENCOUNTER — Encounter: Payer: Self-pay | Admitting: Maternal Newborn

## 2017-07-28 ENCOUNTER — Encounter: Payer: Self-pay | Admitting: Maternal Newborn

## 2017-07-28 DIAGNOSIS — E860 Dehydration: Secondary | ICD-10-CM | POA: Diagnosis not present

## 2017-07-28 DIAGNOSIS — O99511 Diseases of the respiratory system complicating pregnancy, first trimester: Secondary | ICD-10-CM | POA: Diagnosis not present

## 2017-07-28 DIAGNOSIS — O99281 Endocrine, nutritional and metabolic diseases complicating pregnancy, first trimester: Secondary | ICD-10-CM | POA: Diagnosis not present

## 2017-07-28 DIAGNOSIS — O2341 Unspecified infection of urinary tract in pregnancy, first trimester: Secondary | ICD-10-CM | POA: Diagnosis not present

## 2017-07-28 DIAGNOSIS — O10911 Unspecified pre-existing hypertension complicating pregnancy, first trimester: Secondary | ICD-10-CM | POA: Diagnosis not present

## 2017-07-28 DIAGNOSIS — Z79899 Other long term (current) drug therapy: Secondary | ICD-10-CM | POA: Diagnosis not present

## 2017-07-28 DIAGNOSIS — J101 Influenza due to other identified influenza virus with other respiratory manifestations: Secondary | ICD-10-CM | POA: Diagnosis not present

## 2017-07-28 DIAGNOSIS — Z3A12 12 weeks gestation of pregnancy: Secondary | ICD-10-CM | POA: Diagnosis not present

## 2017-07-29 ENCOUNTER — Encounter: Payer: Self-pay | Admitting: Maternal Newborn

## 2017-07-29 ENCOUNTER — Ambulatory Visit (INDEPENDENT_AMBULATORY_CARE_PROVIDER_SITE_OTHER): Payer: 59 | Admitting: Maternal Newborn

## 2017-07-29 VITALS — BP 134/96 | Wt 196.0 lb

## 2017-07-29 DIAGNOSIS — O26899 Other specified pregnancy related conditions, unspecified trimester: Secondary | ICD-10-CM

## 2017-07-29 DIAGNOSIS — Z3A12 12 weeks gestation of pregnancy: Secondary | ICD-10-CM

## 2017-07-29 DIAGNOSIS — K5900 Constipation, unspecified: Secondary | ICD-10-CM

## 2017-07-29 DIAGNOSIS — O99611 Diseases of the digestive system complicating pregnancy, first trimester: Secondary | ICD-10-CM

## 2017-07-29 DIAGNOSIS — O0990 Supervision of high risk pregnancy, unspecified, unspecified trimester: Secondary | ICD-10-CM

## 2017-07-29 DIAGNOSIS — R109 Unspecified abdominal pain: Secondary | ICD-10-CM

## 2017-07-29 MED ORDER — SENNA 8.6 MG PO TABS: 1.0000 | ORAL_TABLET | Freq: Every day | ORAL | 0 refills | Status: DC | PRN

## 2017-07-29 MED ORDER — DOCUSATE SODIUM 100 MG PO CAPS: 100.0000 mg | ORAL_CAPSULE | Freq: Two times a day (BID) | ORAL | 2 refills | Status: DC | PRN

## 2017-07-29 NOTE — Progress Notes (Signed)
C/o pain started again, it wraps around belly;  Doesn't remember hurting this bad before c a bladder inf.rj

## 2017-07-29 NOTE — Progress Notes (Signed)
Prenatal Care Problem Visit  Subjective  Debra Whitney is a 32 y.o. [phone] at [redacted]w[redacted]d being seen today for ongoing prenatal care.  She is currently monitored for the following issues for this high-risk pregnancy and has Atrial fibrillation with RVR ([place]); New onset atrial fibrillation ([place]); Essential hypertension; Obesity; Depression, recurrent ([place]); Nonintractable headache; Supervision of high risk pregnancy, antepartum; Obesity affecting pregnancy, antepartum; Chronic hypertension during pregnancy, antepartum; History of gestational diabetes in prior pregnancy, currently pregnant; BMI 35.0-35.9,adult; and Gastroesophageal reflux disease on their problem list.  ----------------------------------------------------------------------------------- Patient reports abdominal pain and tightness. She was recently diagnosed with a UTI at a hospital visit and has been taking antibiotics for one day. She is also recovering from influenza A and has been taking Tamiflu for four days. She reports constipation and has only been able to pass a small amount of hard stool in the past week. Contractions: Irritability. Vag. Bleeding: None.  Denies leaking of fluid.  ----------------------------------------------------------------------------------- The following portions of the patient's history were reviewed and updated as appropriate: allergies, current medications, past family history, past medical history, past social history, past surgical history and problem list. Problem list updated.   Objective  Blood pressure (!) 134/96, weight 196 lb (88.9 kg), last menstrual period 04/24/2017. Pregravid weight 186 lb (84.4 kg) Total Weight Gain 10 lb (4.536 kg) Urinalysis:      Fetal Status: Fetal Heart Rate (bpm): 158         General:  Alert, oriented and cooperative. Patient is in no acute distress.  Skin: Skin is warm and dry. No rash noted.   Cardiovascular: Normal heart rate noted  Respiratory: Normal  respiratory effort, no problems with respiration noted  Abdomen: Soft, gravid, appropriate for gestational age. Pain/Pressure: Present     Pelvic:  Cervical exam deferred        Extremities: Normal range of motion.     Mental Status: Normal mood and affect. Normal behavior. Normal judgment and thought content.   Pain is in mid-abdomen and suprapubic, radiates from back.  Assessment   32 y.o. [id number] at [redacted]w[redacted]d, EDD 02/09/2018 by Ultrasound presenting for a prenatal problem visit.  Plan   pregnancy Problems (from 06/17/17 to present)    Problem Noted Resolved   Supervision of high risk pregnancy, antepartum 06/17/2017 by Rod Can, CNM No   Overview Addendum 07/01/2017  4:59 PM by Homero Fellers, MD    Clinic [facility] Prenatal Labs  Dating  Blood type: A/Positive/-- (01/09 1602)   Genetic Screen 1 Screen:    AFP:     Quad:     NIPS: Antibody:Negative (01/09 1602)  Anatomic [place]  Rubella: 2.57 (01/09 1602) Varicella: Immune  GTT Early:               Third trimester:  RPR: Non Reactive (01/09 1602)   Rhogam  HBsAg: Negative (01/09 1602)   TDaP vaccine                        Flu Shot: 02/10/2017 HIV: Non Reactive (01/09 1602)   Baby Food                                GBS:   Contraception  Pap: 2016 normal  CBB     CS/VBAC NA   Support Person Husband Legrand Como          History of gestational diabetes in  prior pregnancy, currently pregnant 06/17/2017 by Rod Can, CNM No    Advised patient that UTI and constipation are the most likely causes of her pain and to try stool softener and then laxative if no relief. Continue on antibiotics for UTI. Pain may also be a side effect from Tamiflu but that is less likely.  Preterm labor symptoms and general obstetric precautions including but not limited to vaginal bleeding, contractions, leaking of fluid and fetal movement were reviewed in detail with the patient.  Keep ROB appointment on 08/05/2017.  Avel Sensor, CNM 07/29/2017   12:16 PM

## 2017-08-05 ENCOUNTER — Ambulatory Visit (INDEPENDENT_AMBULATORY_CARE_PROVIDER_SITE_OTHER): Payer: 59 | Admitting: Advanced Practice Midwife

## 2017-08-05 ENCOUNTER — Encounter: Payer: 59 | Admitting: Maternal Newborn

## 2017-08-05 ENCOUNTER — Encounter: Payer: Self-pay | Admitting: Advanced Practice Midwife

## 2017-08-05 VITALS — BP 138/88 | Wt 188.0 lb

## 2017-08-05 DIAGNOSIS — Z3A13 13 weeks gestation of pregnancy: Secondary | ICD-10-CM

## 2017-08-05 NOTE — Progress Notes (Signed)
Routine Prenatal Care Visit  Subjective  Debra Whitney is a 32 y.o. [id number] at [redacted]w[redacted]d being seen today for ongoing prenatal care.  She is currently monitored for the following issues for this high-risk pregnancy and has Atrial fibrillation with RVR ([place]); New onset atrial fibrillation ([place]); Essential hypertension; Obesity; Depression, recurrent ([place]); Nonintractable headache; Supervision of high risk pregnancy, antepartum; Obesity affecting pregnancy, antepartum; Chronic hypertension during pregnancy, antepartum; History of gestational diabetes in prior pregnancy, currently pregnant; BMI 35.0-35.9,adult; Gastroesophageal reflux disease; and Chronic rhinitis on their problem list.  ----------------------------------------------------------------------------------- Patient reports headache that she thinks is sinus related. She has been taking tylenol with relief. She does not eat meat or eggs/dairy since she doesn't feel well when she does.  Contractions: Not present. Vag. Bleeding: None.   . Denies leaking of fluid.  ----------------------------------------------------------------------------------- The following portions of the patient's history were reviewed and updated as appropriate: allergies, current medications, past family history, past medical history, past social history, past surgical history and problem list. Problem list updated.   Objective  Blood pressure 138/88, weight 188 lb (85.3 kg), last menstrual period 04/24/2017, unknown if currently breastfeeding. Pregravid weight 186 lb (84.4 kg) Total Weight Gain 2 lb (0.907 kg) Urinalysis: Urine Protein: Trace Urine Glucose: Negative  Fetal Status: Fetal Heart Rate (bpm): 160         General:  Alert, oriented and cooperative. Patient is in no acute distress.  Skin: Skin is warm and dry. No rash noted.   Cardiovascular: Normal heart rate noted  Respiratory: Normal respiratory effort, no problems with respiration noted  Abdomen:  Soft, gravid, appropriate for gestational age. Pain/Pressure: Absent     Pelvic:  Cervical exam deferred        Extremities: Normal range of motion.  Edema: None  Mental Status: Normal mood and affect. Normal behavior. Normal judgment and thought content.   Assessment   32 y.o. [id number] at [redacted]w[redacted]d by  02/09/2018, by Ultrasound presenting for routine prenatal visit  Plan   pregnancy Problems (from 06/17/17 to present)    Problem Noted Resolved   Supervision of high risk pregnancy, antepartum 06/17/2017 by Rod Can, CNM No   Overview Addendum 07/01/2017  4:59 PM by Homero Fellers, [place] Prenatal Labs  Dating  Blood type: A/Positive/-- (01/09 1602)   Genetic Screen 1 Screen:    AFP:     Quad:     NIPS: Antibody:Negative (01/09 1602)  Anatomic [place]  Rubella: 2.57 (01/09 1602) Varicella: Immune  GTT Early:               Third trimester:  RPR: Non Reactive (01/09 1602)   Rhogam  HBsAg: Negative (01/09 1602)   TDaP vaccine                        Flu Shot: 02/10/2017 HIV: Non Reactive (01/09 1602)   Baby Food                                GBS:   Contraception  Pap: 2016 normal  CBB     CS/VBAC NA   Support Person Husband Legrand Como          History of gestational diabetes in prior pregnancy, currently pregnant 06/17/2017 by Rod Can, CNM No       Preterm labor symptoms and general obstetric precautions including but not limited to vaginal bleeding,  contractions, leaking of fluid and fetal movement were reviewed in detail with the patient. Please refer to After Visit Summary for other counseling recommendations.  Encouraged increased hydration, saline nasal spray for sinus congestion. Recommend decreasing sugar/simple carbohydrate intake and increase protein and healthy fat intake.  Return in about 4 weeks (around 09/02/2017) for rob.  Rod Can, CNM 08/05/2017 10:38 AM

## 2017-08-05 NOTE — Patient Instructions (Signed)

## 2017-08-05 NOTE — Progress Notes (Signed)
ROB headaches

## 2017-08-11 ENCOUNTER — Encounter: Payer: 59 | Admitting: Maternal Newborn

## 2017-09-02 ENCOUNTER — Encounter: Payer: 59 | Admitting: Maternal Newborn

## 2018-01-28 HISTORY — PX: TUBAL LIGATION: SHX77

## 2018-05-04 ENCOUNTER — Encounter (HOSPITAL_COMMUNITY): Payer: Self-pay

## 2019-02-01 ENCOUNTER — Emergency Department: Payer: BC Managed Care – PPO

## 2019-02-01 ENCOUNTER — Emergency Department
Admission: EM | Admit: 2019-02-01 | Discharge: 2019-02-01 | Disposition: A | Discharge status: Home / Self Care | Payer: BC Managed Care – PPO | Attending: Emergency Medicine | Admitting: Emergency Medicine

## 2019-02-01 ENCOUNTER — Other Ambulatory Visit: Payer: Self-pay

## 2019-02-01 ENCOUNTER — Encounter: Payer: Self-pay | Admitting: Emergency Medicine

## 2019-02-01 DIAGNOSIS — R059 Cough, unspecified: Secondary | ICD-10-CM

## 2019-02-01 DIAGNOSIS — R509 Fever, unspecified: Secondary | ICD-10-CM

## 2019-02-01 DIAGNOSIS — I10 Essential (primary) hypertension: Secondary | ICD-10-CM | POA: Diagnosis not present

## 2019-02-01 DIAGNOSIS — J029 Acute pharyngitis, unspecified: Secondary | ICD-10-CM

## 2019-02-01 DIAGNOSIS — R05 Cough: Secondary | ICD-10-CM

## 2019-02-01 DIAGNOSIS — R079 Chest pain, unspecified: Secondary | ICD-10-CM

## 2019-02-01 DIAGNOSIS — Z79899 Other long term (current) drug therapy: Secondary | ICD-10-CM | POA: Insufficient documentation

## 2019-02-01 DIAGNOSIS — U071 COVID-19: Secondary | ICD-10-CM | POA: Insufficient documentation

## 2019-02-01 LAB — CBC
HCT: 38.1 % (ref 36.0–46.0)
Hemoglobin: 12.3 g/dL (ref 12.0–15.0)
MCH: 28.3 pg (ref 26.0–34.0)
MCHC: 32.3 g/dL (ref 30.0–36.0)
MCV: 87.6 fL (ref 80.0–100.0)
Platelets: 291 10*3/uL (ref 150–400)
RBC: 4.35 MIL/uL (ref 3.87–5.11)
RDW: 12.9 % (ref 11.5–15.5)
WBC: 6.4 10*3/uL (ref 4.0–10.5)
nRBC: 0 % (ref 0.0–0.2)

## 2019-02-01 LAB — POCT PREGNANCY, URINE: Preg Test, Ur: NEGATIVE

## 2019-02-01 LAB — URINALYSIS, COMPLETE (UACMP) WITH MICROSCOPIC
Bacteria, UA: NONE SEEN
Bilirubin Urine: NEGATIVE
Glucose, UA: NEGATIVE mg/dL
Hgb urine dipstick: NEGATIVE
Ketones, ur: NEGATIVE mg/dL
Leukocytes,Ua: NEGATIVE
Nitrite: NEGATIVE
Protein, ur: NEGATIVE mg/dL
Specific Gravity, Urine: 1.008 (ref 1.005–1.030)
pH: 8 (ref 5.0–8.0)

## 2019-02-01 LAB — BASIC METABOLIC PANEL
Anion gap: 10 (ref 5–15)
BUN: 8 mg/dL (ref 6–20)
CO2: 22 mmol/L (ref 22–32)
Calcium: 8.9 mg/dL (ref 8.9–10.3)
Chloride: 105 mmol/L (ref 98–111)
Creatinine, Ser: 0.53 mg/dL (ref 0.44–1.00)
GFR calc Af Amer: >60 mL/min (ref >60)
GFR calc non Af Amer: >60 mL/min (ref >60)
Glucose, Bld: 104 mg/dL — ABNORMAL HIGH (ref 70–99)
Potassium: 3.7 mmol/L (ref 3.5–5.1)
Sodium: 137 mmol/L (ref 135–145)

## 2019-02-01 LAB — TROPONIN I (HIGH SENSITIVITY): Troponin I (High Sensitivity): 3 ng/L (ref <18)

## 2019-02-01 LAB — GROUP A STREP BY PCR: Group A Strep by PCR: NOT DETECTED

## 2019-02-01 MED ORDER — SODIUM CHLORIDE 0.9 % IV BOLUS
1000.0000 mL | Freq: Once | INTRAVENOUS | Status: AC
  Administered 2019-02-01: 04:00:00 1000 mL via INTRAVENOUS

## 2019-02-01 MED ORDER — AMOXICILLIN-POT CLAVULANATE 875-125 MG PO TABS: 1.0000 | ORAL_TABLET | Freq: Two times a day (BID) | ORAL | 0 refills | Status: DC

## 2019-02-01 MED ORDER — KETOROLAC TROMETHAMINE 30 MG/ML IJ SOLN
10.0000 mg | Freq: Once | INTRAMUSCULAR | Status: AC
  Administered 2019-02-01: 04:00:00 9.9 mg via INTRAVENOUS
  Filled 2019-02-01: qty 1

## 2019-02-01 MED ORDER — ACETAMINOPHEN 325 MG PO TABS
ORAL_TABLET | ORAL | Status: AC
  Filled 2019-02-01: qty 2

## 2019-02-01 MED ORDER — ACETAMINOPHEN 325 MG PO TABS
650.0000 mg | ORAL_TABLET | Freq: Once | ORAL | Status: AC | PRN
  Administered 2019-02-01: 650 mg via ORAL

## 2019-02-01 NOTE — Discharge Instructions (Addendum)
1.  Alternate Tylenol and Ibuprofen every 4 hours as needed for fever greater than 100.4 F. 2.  Your COVID-19 results should be back in 1 day.  If negative, and you are not feeling better, you may start antibiotic prescription. 3.  Return to the ER for worsening symptoms, persistent vomiting, difficulty breathing or other concerns.

## 2019-02-01 NOTE — ED Triage Notes (Signed)
Patient to ER for c/o tight feeling in chest, fever, chills, HA x3 days (has h/o frequent HA's), sore throat, slight cough.

## 2019-02-01 NOTE — ED Provider Notes (Signed)
[facility] Emergency Department Provider Note   ____________________________________________   First MD Initiated Contact with Patient 02/01/19 [phone]     (approximate)  I have reviewed the triage vital signs and the nursing notes.   HISTORY  Chief Complaint Fever, Chest Pain, and Cough    HPI Debra Whitney is a 33 y.o. female who presents to the ED from home with a chief complaint of fever, chills, cough, headache, sore throat and chest tightness.  Patient has frequent headaches and reports a global headache for the past 3 days.  Subsequently developed fever 101 F, with the above symptoms.  Denies shortness of breath, abdominal pain, vomiting, dysuria or diarrhea.  Denies recent travel, trauma or exposure to persons diagnosed with coronavirus.       Past Medical History:  Diagnosis Date   Atrial fibrillation ([place])    a. diagnosed on 10/20/2016; chads2vasc => at least 2 (HTN, sex category), possibly 3 given history of gestational diabetes   Endometriosis    GERD (gastroesophageal reflux disease)    Gestational diabetes    Hypertension    Menorrhagia    Migraine    Obesity     Patient Active Problem List   Diagnosis Date Noted   Gastroesophageal reflux disease 07/15/2017   BMI 35.0-35.9,adult 07/01/2017   Supervision of high risk pregnancy, antepartum 06/17/2017   Obesity affecting pregnancy, antepartum 06/17/2017   Chronic hypertension during pregnancy, antepartum 06/17/2017   History of gestational diabetes in prior pregnancy, currently pregnant 06/17/2017   Nonintractable headache 04/02/2017   Depression, recurrent ([place]) 10/27/2016   Atrial fibrillation with RVR ([place]) 10/20/2016   New onset atrial fibrillation ([place]) 10/20/2016   Essential hypertension 10/20/2016   Obesity 10/20/2016   Chronic rhinitis 06/30/2012    Past Surgical History:  Procedure Laterality Date   DILATION AND CURETTAGE OF UTERUS      endometrial surgery     HERNIA REPAIR     HYSTEROSCOPY W/D&C  11/29/2015   Procedure: DILATATION AND CURETTAGE /HYSTEROSCOPY;  Surgeon: Malachy Mood, MD;  Location: [facility] ORS;  Service: Gynecology;;   LAPAROSCOPY N/A 11/29/2015   Procedure: LAPAROSCOPY DIAGNOSTIC;  Surgeon: Malachy Mood, MD;  Location: [facility] ORS;  Service: Gynecology;  Laterality: N/A;   NASAL SINUS SURGERY     WISDOM TOOTH EXTRACTION      Prior to Admission medications   Medication Sig Start Date End Date Taking? Authorizing Provider  labetalol (NORMODYNE) 100 MG tablet Take 1 tablet (100 mg total) by mouth daily. 06/17/17   Rod Can, CNM  nitrofurantoin, macrocrystal-monohydrate, (MACROBID) 100 MG capsule  07/28/17   [provider]  omeprazole (PRILOSEC) 20 MG capsule Take 1 capsule (20 mg total) by mouth 2 (two) times daily before a meal. May reduce dosage to once daily if symptoms are well controlled. 07/15/17   Rexene Agent, CNM  ondansetron (ZOFRAN-ODT) 4 MG disintegrating tablet  07/26/17   [provider]  Prenatal Multivit-Min-Fe-FA (PRENATAL VITAMINS) 0.8 MG tablet Take 1 tablet by mouth daily. 07/01/17   Schuman, Stefanie Libel, MD  senna (SENOKOT) 8.6 MG TABS tablet Take 1 tablet (8.6 mg total) by mouth daily as needed for mild constipation. 07/29/17   Rexene Agent, CNM    Allergies Patient has no known allergies.  Family History  Problem Relation Age of Onset   Diabetes Mother    Hypertension Mother    CVA Maternal Grandmother     Social History Social History   Tobacco Use  Smoking status: Never Smoker   Smokeless tobacco: Never Used  Substance Use Topics   Alcohol use: Yes    Comment: occasionally   Drug use: No    Review of Systems  Constitutional: Positive for fever/chills Eyes: No visual changes. ENT: No sore throat. Cardiovascular: Positive for chest tightness. Respiratory: Positive for cough.  Denies shortness of breath. Gastrointestinal:  No abdominal pain.  No nausea, no vomiting.  No diarrhea.  No constipation. Genitourinary: Negative for dysuria. Musculoskeletal: Negative for back pain. Skin: Negative for rash. Neurological: Positive for headache.  Negative for focal weakness or numbness.   ____________________________________________   PHYSICAL EXAM:  VITAL SIGNS: ED Triage Vitals  Enc Vitals Group     BP 02/01/19 0054 (!) 156/104     Pulse Rate 02/01/19 0054 (!) 119     Resp 02/01/19 0054 20     Temp 02/01/19 0054 100.2 F (37.9 C)     Temp Source 02/01/19 0054 Oral     SpO2 02/01/19 0054 100 %     Weight 02/01/19 0055 185 lb (83.9 kg)     Height 02/01/19 0055 5\' 1"  (1.549 m)     Head Circumference --      Peak Flow --      Pain Score 02/01/19 0054 8     Pain Loc --      Pain Edu? --      Excl. in [place]? --     Constitutional: Alert and oriented. Well appearing and in no acute distress. Eyes: Conjunctivae are normal. PERRL. EOMI. Head: Atraumatic. Nose: No congestion/rhinnorhea. Mouth/Throat: Mucous membranes are moist.  Oropharynx mildly erythematous without tonsillar swelling, exudates or peritonsillar abscess.  There is no hoarse or muffled voice.  There is no drooling. Neck: No stridor.  Supple neck without meningismus. Cardiovascular: Normal rate, regular rhythm. Grossly normal heart sounds.  Good peripheral circulation. Respiratory: Normal respiratory effort.  No retractions. Lungs CTAB. Gastrointestinal: Soft and nontender. No distention. No abdominal bruits. No CVA tenderness. Musculoskeletal: No lower extremity tenderness nor edema.  No joint effusions. Neurologic:  Normal speech and language. No gross focal neurologic deficits are appreciated. No gait instability. Skin:  Skin is warm, dry and intact. No rash noted.  No petechiae. Psychiatric: Mood and affect are normal. Speech and behavior are normal.  ____________________________________________   LABS (all labs ordered are listed, but  only abnormal results are displayed)  Labs Reviewed  BASIC METABOLIC PANEL - Abnormal; Notable for the following components:      Result Value   Glucose, Bld 104 (*)    All other components within normal limits  URINALYSIS, COMPLETE (UACMP) WITH MICROSCOPIC - Abnormal; Notable for the following components:   Color, Urine STRAW (*)    APPearance CLEAR (*)    All other components within normal limits  GROUP A STREP BY PCR  NOVEL CORONAVIRUS, NAA (HOSP ORDER, SEND-OUT TO REF LAB; TAT 18-24 HRS)  CBC  POC URINE PREG, ED  POCT PREGNANCY, URINE  TROPONIN I (HIGH SENSITIVITY)   ____________________________________________  EKG  ED ECG REPORT I, Nandini Bogdanski J, the attending physician, personally viewed and interpreted this ECG.   Date: 02/01/2019  EKG Time: 0053  Rate: 111  Rhythm: sinus tachycardia  Axis: Normal  Intervals:none  ST&T Change: Nonspecific  ____________________________________________  RADIOLOGY  ED MD interpretation: Unremarkable chest x-ray  Official radiology report(s): Dg Chest Port 1 View  Result Date: 02/01/2019 CLINICAL DATA:  Tight feeling in chest, fever and chills EXAM: PORTABLE CHEST 1  VIEW COMPARISON:  10/20/2016 FINDINGS: The heart size and mediastinal contours are within normal limits. Both lungs are clear. The visualized skeletal structures are unremarkable. IMPRESSION: No active disease. Electronically Signed   By: Donavan Foil M.D.   On: 02/01/2019 03:50    ____________________________________________   PROCEDURES  Procedure(s) performed (including Critical Care):  Procedures   ____________________________________________   INITIAL IMPRESSION / ASSESSMENT AND PLAN / ED COURSE  As part of my medical decision making, I reviewed the following data within the [place] notes reviewed and incorporated, Labs reviewed, EKG interpreted, Old chart reviewed, Radiograph reviewed and Notes from prior ED visits       Debra DNYLA GONDEK was evaluated in Emergency Department on 02/01/2019 for the symptoms described in the history of present illness. She was evaluated in the context of the global COVID-19 pandemic, which necessitated consideration that the patient might be at risk for infection with the SARS-CoV-2 virus that causes COVID-19. Institutional protocols and algorithms that pertain to the evaluation of patients at risk for COVID-19 are in a state of rapid change based on information released by regulatory bodies including the CDC and federal and state organizations. These policies and algorithms were followed during the patient's care in the ED.   33 year old female who presents with fever, cough, sore throat, chest tightness.  Differential diagnosis includes but is not limited to viral illness, COVID-19, CAP, etc.  Laboratory results unremarkable; rapid strep negative.  Will obtain send out COVID swab.  Initiate IV fluids, IV Toradol and reassess.  Clinical Course as of Jan 31 637  Tue Feb 01, 2019  0603 Patient feeling significantly better, resting in no acute distress.  She is no longer tachycardic nor hypertensive.  Will discharge home with Augmentin with instructions to wait until her COVID results are back.  If negative, and she is still running fevers with sore throat then she may start it.  Strict return precautions.  Patient verbalizes understanding agrees with plan of care.   [JS]    Clinical Course User Index [JS] Paulette Blanch, MD     ____________________________________________   FINAL CLINICAL IMPRESSION(S) / ED DIAGNOSES  Final diagnoses:  Cough  Chest pain     ED Discharge Orders    None       Note:  This document was prepared using Dragon voice recognition software and may include unintentional dictation errors.   Paulette Blanch, MD 02/01/19 [phone]

## 2019-02-02 ENCOUNTER — Telehealth: Payer: Self-pay | Admitting: General Practice

## 2019-02-02 LAB — NOVEL CORONAVIRUS, NAA (HOSP ORDER, SEND-OUT TO REF LAB; TAT 18-24 HRS): SARS-CoV-2, NAA: DETECTED — AB

## 2019-02-02 NOTE — ED Notes (Signed)
Attempted to call patient with positive COVID results without answer. No message left because there were no identifiers in patient's voicemail box.

## 2019-02-02 NOTE — Telephone Encounter (Signed)
Attempted to reach pt to verify she did receive covid results as noted by RN in ED 02/01/2019 at [place]. Left VM to CB

## 2019-02-02 NOTE — ED Notes (Signed)
RN able to call and update pt on COVID results. Questions answered by this RN.

## 2019-02-02 NOTE — Telephone Encounter (Signed)
Patient returned call  - DOB verified - reports reviewed Positive results on mychart and has spoken with Shannon,RN/Charge Nurse  - [facility] regarding COVID19 protocol.

## 2019-02-02 NOTE — Telephone Encounter (Signed)
Patient calling for covid-19 result. NT unavailable. Patient disconnected call before able to advise.

## 2019-02-07 ENCOUNTER — Telehealth: Payer: Self-pay

## 2019-02-07 NOTE — Telephone Encounter (Signed)
Pt requesting return call. No details given. Cb#[phone]

## 2019-02-07 NOTE — Telephone Encounter (Signed)
Spoke w/pt. She is experiencing heavy bleeding beginning last month with cramping. This month she is passing clots. Period lasted 1-1.5 weeks last month. She is interested in getting back in here to discuss hysterectomy w/[facility]. However, she tested + for Covid last week & is on quarentine until 02/15/2019. Advised to monitor for having to change fully soaked regular size pad more than 1x/hour (report to ED). Will send message to [facility] who she is aware isn't in office until tomorrow. Advised also can go ahead & schedule apt for after quarantine date. (she will wait to hear back from [place])

## 2019-02-08 ENCOUNTER — Other Ambulatory Visit: Payer: Self-pay | Admitting: Obstetrics and Gynecology

## 2019-02-08 MED ORDER — MEDROXYPROGESTERONE ACETATE 10 MG PO TABS: 10.0000 mg | ORAL_TABLET | Freq: Every day | ORAL | 11 refills | Status: DC

## 2019-02-08 NOTE — Telephone Encounter (Signed)
Can have follow late next week was tested 02/01/2019

## 2019-02-08 NOTE — Telephone Encounter (Signed)
Patient is aware & has scheduled apt for 02/18/2019

## 2019-02-18 ENCOUNTER — Other Ambulatory Visit: Payer: Self-pay

## 2019-02-18 ENCOUNTER — Encounter: Payer: Self-pay | Admitting: Obstetrics and Gynecology

## 2019-02-18 ENCOUNTER — Ambulatory Visit (INDEPENDENT_AMBULATORY_CARE_PROVIDER_SITE_OTHER): Payer: BC Managed Care – PPO | Admitting: Obstetrics and Gynecology

## 2019-02-18 VITALS — BP 134/90 | HR 100 | Ht 61.0 in | Wt 183.0 lb

## 2019-02-18 DIAGNOSIS — Z1371 Encounter for nonprocreative screening for genetic disease carrier status: Secondary | ICD-10-CM | POA: Insufficient documentation

## 2019-02-18 DIAGNOSIS — N939 Abnormal uterine and vaginal bleeding, unspecified: Secondary | ICD-10-CM | POA: Diagnosis not present

## 2019-02-18 NOTE — Progress Notes (Signed)
Gynecology Abnormal Uterine Bleeding Initial Evaluation   Chief Complaint:  Chief Complaint  Patient presents with  . Metrorrhagia    History of Present Illness:    Paitient is a 33 y.o. [id number] who LMP was Patient's last menstrual period was 02/07/2019., presents today for a problem visit.  She complains of menometrorrhagia that is longstanding, but reports recent acute exacerbation in the past 3 months.  She has a history of abnormal uterine bleeding with normal work up by myself in 2017 (work up included evaluation for structural lesions as well as hormonal axis). She had had an interveening C-section.  Feels menses have gotten heavier and cramping since then.  The severity is described as severe.  She has had bleeding for up to two week, the most recent menstrual cycle was heavy for a week, with passage of clots.  She was started on provera prior to her visit and reports good reduction in bleeding.  The patient menstrual complaints are chronic present for the past 6 months.  The patient is sexually active. She currently uses tubal ligationfor contraception.  Last Pap results esults were obtained 08/21/2016 no abnormalities   Previous evaluation: [place] 08/22/2016 06/17/2017 no evidence of uterine fibroids or endometrial abnormalities. D&C 11/29/2015 secretory endometrium without evidence of hyperplasia or malignancy.    Previous Treatment: D&C  LMP: Patient's last menstrual period was 02/07/2019.  Paramter Normal / Abnormal Prsent  Frequency Amenoorhea     Infrequent (>38 days)     Normal (?24 days ?38 days)     Freequent (<24 days) X  Duration Normal (?8 days)     Prolonged (>8 days) X  Regularity Regular (shortest to longest cycle variation ?7-9 days)*     Irregular (shortest to longest cycle variation ?8-10days)* X  Flow Volume Light    (Self reported) Normal     Heavy X      Intermenstrual Bleeding None X   Random     Cyclical early     Cyclical mid     Cyclical late         Unscheduled Bleeding  Not applicable X  (exogenous hormones) Absent     Present     FIGO AUB I System: *The available evidence suggests that, using these criteria, the normal range (shortest to longest) varies with age: 55-25 y of age, ?65 d; 34-41 y, ?7 d; and for 59-45 y, ?9 d    Review of Systems: ROS  Past Medical History:  Past Medical History:  Diagnosis Date  . Atrial fibrillation ([place])    a. diagnosed on 10/20/2016; chads2vasc => at least 2 (HTN, sex category), possibly 3 given history of gestational diabetes  . Endometriosis   . GERD (gastroesophageal reflux disease)   . Gestational diabetes   . Hypertension   . Menorrhagia   . Migraine   . Obesity     Past Surgical History:  Past Surgical History:  Procedure Laterality Date  . CESAREAN SECTION  01/28/2018  . DILATION AND CURETTAGE OF UTERUS    . endometrial surgery    . HERNIA REPAIR    . HYSTEROSCOPY W/D&C  11/29/2015   Procedure: DILATATION AND CURETTAGE /HYSTEROSCOPY;  Surgeon: Malachy Mood, MD;  Location: [facility] ORS;  Service: Gynecology;;  . LAPAROSCOPY N/A 11/29/2015   Procedure: LAPAROSCOPY DIAGNOSTIC;  Surgeon: Malachy Mood, MD;  Location: [facility] ORS;  Service: Gynecology;  Laterality: N/A;  . NASAL SINUS SURGERY    . TUBAL LIGATION  01/28/2018  . WISDOM  TOOTH EXTRACTION      Obstetric History: [id number]  Family History:  Family History  Problem Relation Age of Onset  . Diabetes Mother   . Hypertension Mother   . CVA Maternal Grandmother     Social History:  Social History   Socioeconomic History  . Marital status: Married    Spouse name: Not on file  . Number of children: Not on file  . Years of education: Not on file  . Highest education level: Not on file  Occupational History  . Not on file  Social Needs  . Financial resource strain: Not on file  . Food insecurity    Worry: Not on file    Inability: Not on file  . Transportation needs    Medical: Not on file    Non-medical: Not  on file  Tobacco Use  . Smoking status: Never Smoker  . Smokeless tobacco: Never Used  Substance and Sexual Activity  . Alcohol use: Yes    Comment: occasionally  . Drug use: No  . Sexual activity: Yes    Birth control/protection: None  Lifestyle  . Physical activity    Days per week: Not on file    Minutes per session: Not on file  . Stress: Not on file  Relationships  . Social Herbalist on phone: Not on file    Gets together: Not on file    Attends religious service: Not on file    Active member of club or organization: Not on file    Attends meetings of clubs or organizations: Not on file    Relationship status: Not on file  . Intimate partner violence    Fear of current or ex partner: Not on file    Emotionally abused: Not on file    Physically abused: Not on file    Forced sexual activity: Not on file  Other Topics Concern  . 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              Allergies:  No Known Allergies  Medications: Prior to Admission medications   Medication Sig Start Date End Date Taking? Authorizing Provider  amoxicillin-clavulanate (AUGMENTIN) 875-125 MG tablet Take 1 tablet by mouth 2 (two) times daily. 02/01/19   Paulette Blanch, MD  labetalol (NORMODYNE) 100 MG tablet Take 1 tablet (100 mg total) by mouth daily. 06/17/17   Rod Can, CNM  medroxyPROGESTERone (PROVERA) 10 MG tablet Take 1 tablet (10 mg total) by mouth daily. 02/08/19 03/10/19  Malachy Mood, MD  nitrofurantoin, macrocrystal-monohydrate, (MACROBID) 100 MG capsule  07/28/17   [provider]  omeprazole (PRILOSEC) 20 MG capsule Take 1 capsule (20 mg total) by mouth 2 (two) times daily before a meal. May reduce dosage to once daily if symptoms are well controlled. 07/15/17   Rexene Agent, CNM  ondansetron (ZOFRAN-ODT) 4 MG disintegrating tablet  07/26/17   [provider]  Prenatal  Multivit-Min-Fe-FA (PRENATAL VITAMINS) 0.8 MG tablet Take 1 tablet by mouth daily. 07/01/17   Schuman, Stefanie Libel, MD  senna (SENOKOT) 8.6 MG TABS tablet Take 1 tablet (8.6 mg total) by mouth daily as needed for mild constipation. 07/29/17   Rexene Agent, CNM    Physical Exam unknown if currently breastfeeding.  Patient's last menstrual period was 02/07/2019.  General: NAD HEENT: normocephalic, anicteric Thyroid: no enlargement, no  palpable nodules Pulmonary: No increased work of breathing Cardiovascular: RRR, distal pulses 2+ Abdomen: NABS, soft, non-tender, non-distended.  Umbilicus without lesions.  No hepatomegaly, splenomegaly or masses palpable. No evidence of hernia  Genitourinary:  External: Normal external female genitalia.  Normal urethral meatus, normal Bartholin's and Skene's glands.    Vagina: Normal vaginal mucosa, no evidence of prolapse.    Cervix: Grossly normal in appearance, no bleeding  Uterus: Non-enlarged, mobile, normal contour.  No CMT  Adnexa: ovaries non-enlarged, no adnexal masses  Rectal: deferred  Lymphatic: no evidence of inguinal lymphadenopathy Extremities: no edema, erythema, or tenderness Neurologic: Grossly intact Psychiatric: mood appropriate, affect full  Female chaperone present for pelvic portions of the physical exam  Assessment: 34 y.o. [id number] with abnormal uterine bleeding  Plan: Problem List Items Addressed This Visit      Other   BRCA negative    Other Visit Diagnoses    Abnormal uterine bleeding    -  Primary   Relevant Orders   TSH+Prl+FSH+TestT+LH+DHEA S...   CBC      1) Discussed management options for abnormal uterine bleeding including expectant, NSAIDs, tranexamic acid (Lysteda), oral progesterone (Provera, norethindrone, megace), Depo Provera, Levonorgestrel containing IUD, endometrial ablation (Novasure) or hysterectomy as definitive surgical management.  Discussed risks and benefits of each method.   Final  management decision will hinge on results of patient's work up and whether an underlying etiology for the patients bleeding symptoms can be discerned.  We will conduct a basic work up examining using the PALM-COIEN classification system.  In the meantime the patient opts to trial provera while we await results of her ultrasound and labs.  The role of unopposed estrogen in the development of endometrial hyperplasia or carcinoma is discussed.  The risk of endometrial hyperplasia is linearly correlated with increasing BMI given the production of estrone by adipose tissue. Printed patient education handouts were given to the patient to review at home.  Bleeding precautions reviewed.  - lab work obtained today as sometime since last evaluation - the patient has previously tried norethindrone, as well as MIrena IUD (expulsion) - post TLH, has tried IUD CHTN afib contra to estrogen, discussed ablation and failure rates - endometrial biopsy at time of preop  2) Return if symptoms worsen or fail to improve.   Malachy Mood, MD, [facility], [facility] 02/18/2019, 1:32 PM

## 2019-02-22 ENCOUNTER — Telehealth: Payer: Self-pay | Admitting: Obstetrics and Gynecology

## 2019-02-22 NOTE — Telephone Encounter (Signed)
Patient returned the call. Her cardiologist is Dr. Fletcher Anon, and she will call for an appointment. Patient was given available OR dates, and will discuss with husband and call back later this afternoon or tomorrow morning.

## 2019-02-22 NOTE — Telephone Encounter (Signed)
-----   Message from Malachy Mood, MD sent at 02/18/2019 10:28 PM EDT ----- Regarding: surgery Surgery Date: Patient preference   LOS: observation  Surgery Booking Request Patient Full Name: KASSYDI ZAREMBA MRN: [id number]  DOB: [birth year]  Surgeon: Malachy Mood, MD  Requested Surgery Date and Time:  Primary Diagnosis and Code: AUB Secondary Diagnosis and Code:  Surgical Procedure: TLH/BS and cystoscopy L&D Notification:N/A Admission Status: observation Length of Surgery: 2hrs Special Case Needs: none H&P: 2 weeks prior (date) Phone Interview or Office Pre-Admit: pre-admit Interpreter: No Language: English Medical Clearance: Yes Cardiology - secondary to A-Fib Special Scheduling Instructions: Endometrial biopsy at time of preop

## 2019-02-22 NOTE — Telephone Encounter (Signed)
Lmtrc

## 2019-02-22 NOTE — Telephone Encounter (Signed)
Patient decided on the 03/24/19 surgery date. Patient is aware of H&P/end bx on 03/14/19 @ 4:10pm w/ Dr. Georgianne Fick, Pre-admit testing to be scheduled (patient will watch for notification in MyChart), Covid testing on 03/21/19, and OR on 03/24/19. Patient will make appointment for cardiology clearance. Patient is aware she will be asked to quarantine after Covid testing. Patient is aware she may receive calls from the [place] and [facility]. Patient confirmed BCBS and no secondary insurance.

## 2019-02-23 ENCOUNTER — Telehealth: Payer: Self-pay | Admitting: Cardiovascular Disease

## 2019-02-23 NOTE — Telephone Encounter (Signed)
Left message that we will need to schedule appt for surgery clearance. I have sent a message to [facility] asking to call the pt with an appt.

## 2019-02-23 NOTE — Telephone Encounter (Signed)
   Primary Cardiologist:   Kathlyn Sacramento, MD  Chart reviewed as part of pre-operative protocol coverage. Because of Debra [facility]'s past medical history and time since last visit, she will require a follow-up visit in order to better assess preoperative cardiovascular risk.  She has not been seen since 2018.  Pre-op covering staff: - Please schedule appointment with Dr. Fletcher Anon or APP on his team and call patient to inform her. - Please contact requesting surgeon's office via preferred method (i.e, phone, fax) to inform them of need for appointment prior to surgery. - This note will be removed from the Preop APP Pool.  Richardson Dopp, PA-C  02/23/2019, 11:35 AM

## 2019-02-23 NOTE — Telephone Encounter (Signed)
° °  [facility] Pre-operative Risk Assessment    Request for surgical clearance:  1. What type of surgery is being performed? Total Laparoscopic Hysterectomy, Bilateral salpingectomy, Cystoscopy    2. When is this surgery scheduled? 03/24/2019   3. What type of clearance is required (medical clearance vs. Pharmacy clearance to hold med vs. Both)? Medical   4. Are there any medications that need to be held prior to surgery and how long? None listed   5. Practice name and name of physician performing surgery? [facility] - Dr. Malachy Mood  6. What is your office phone number [phone]    7.   What is your office fax number [phone]  8.   Anesthesia type (None, local, MAC, general) ? None listed    Ace Gins 02/23/2019, 10:38 AM  _________________________________________________________________   (provider comments below)

## 2019-02-23 NOTE — Telephone Encounter (Signed)
Pt has appt 03/10/19 with Christell Faith, PAC. I will route surgery information to PA for appt.

## 2019-02-23 NOTE — Telephone Encounter (Signed)
Will assess patient at visit.

## 2019-02-24 NOTE — Telephone Encounter (Signed)
Clearance request fax sent 02/22/19. I spoke to [facility] @ Dr. Tyrell Antonio office, who confirmed the fax was received. Patient is scheduled for office visit on 03/10/19.

## 2019-02-25 LAB — TSH+PRL+FSH+TESTT+LH+DHEA S...
17-Hydroxyprogesterone: 27 ng/dL
Androstenedione: 89 ng/dL (ref 41–262)
DHEA-SO4: 112 ug/dL (ref 84.8–378.0)
FSH: 3.7 m[IU]/mL
LH: 3.2 m[IU]/mL
Prolactin: 3.6 ng/mL — ABNORMAL LOW (ref 4.8–23.3)
TSH: 1.12 u[IU]/mL (ref 0.450–4.500)
Testosterone, Free: 0.7 pg/mL (ref 0.0–4.2)
Testosterone: 8 ng/dL (ref 8–48)

## 2019-02-25 LAB — CBC
Hematocrit: 36.4 % (ref 34.0–46.6)
Hemoglobin: 12.1 g/dL (ref 11.1–15.9)
MCH: 28.4 pg (ref 26.6–33.0)
MCHC: 33.2 g/dL (ref 31.5–35.7)
MCV: 85 fL (ref 79–97)
Platelets: 410 10*3/uL (ref 150–450)
RBC: 4.26 x10E6/uL (ref 3.77–5.28)
RDW: 13.4 % (ref 11.7–15.4)
WBC: 7.5 10*3/uL (ref 3.4–10.8)

## 2019-03-04 ENCOUNTER — Other Ambulatory Visit: Payer: Self-pay

## 2019-03-04 ENCOUNTER — Other Ambulatory Visit (HOSPITAL_COMMUNITY)
Admission: RE | Admit: 2019-03-04 | Discharge: 2019-03-04 | Disposition: A | Discharge status: Home / Self Care | Payer: BC Managed Care – PPO | Source: Ambulatory Visit | Attending: Obstetrics and Gynecology | Admitting: Obstetrics and Gynecology

## 2019-03-04 ENCOUNTER — Ambulatory Visit (INDEPENDENT_AMBULATORY_CARE_PROVIDER_SITE_OTHER): Payer: BC Managed Care – PPO | Admitting: Obstetrics and Gynecology

## 2019-03-04 ENCOUNTER — Encounter: Payer: Self-pay | Admitting: Obstetrics and Gynecology

## 2019-03-04 VITALS — BP 140/100 | HR 89 | Ht 61.0 in | Wt 183.0 lb

## 2019-03-04 DIAGNOSIS — N939 Abnormal uterine and vaginal bleeding, unspecified: Secondary | ICD-10-CM | POA: Insufficient documentation

## 2019-03-04 DIAGNOSIS — Z01818 Encounter for other preprocedural examination: Secondary | ICD-10-CM

## 2019-03-04 NOTE — Progress Notes (Signed)
Obstetrics & Gynecology Surgery H&P    Chief Complaint: Scheduled Surgery   History of Present Illness: Patient is a 33 y.o. [id number] presenting for scheduled TLH, BS, cystoscopy for the treatment or further evaluation of abnormal uterine bleeding.   Prior Treatments prior to proceeding with surgery include: has tried IUD, currently some improvement on continuous progestin but still with breakthrough bleeding  Preoperative Pap: 08/21/2016 Results: NIL HPV negative  Preoperative Endometrial biopsy: obtained today, prior [facility] 11/29/2015 secretory endometrium without evidence of hyperplasia or malignancy.    Preoperative Ultrasound: Multiple Ultrasound including during early pregnancies showing no evidence of uterine structural defects  Review of Systems:10 point review of systems  Past Medical History:  Past Medical History:  Diagnosis Date   Atrial fibrillation ([place])    a. diagnosed on 10/20/2016; chads2vasc => at least 2 (HTN, sex category), possibly 3 given history of gestational diabetes   Endometriosis    GERD (gastroesophageal reflux disease)    Gestational diabetes    Hypertension    Menorrhagia    Migraine    Obesity     Past Surgical History:  Past Surgical History:  Procedure Laterality Date   CESAREAN SECTION  01/28/2018   DILATION AND CURETTAGE OF UTERUS     endometrial surgery     HERNIA REPAIR     HYSTEROSCOPY W/D&C  11/29/2015   Procedure: DILATATION AND CURETTAGE /HYSTEROSCOPY;  Surgeon: Malachy Mood, MD;  Location: [facility] ORS;  Service: Gynecology;;   LAPAROSCOPY N/A 11/29/2015   Procedure: LAPAROSCOPY DIAGNOSTIC;  Surgeon: Malachy Mood, MD;  Location: [facility] ORS;  Service: Gynecology;  Laterality: N/A;   NASAL SINUS SURGERY     TUBAL LIGATION  01/28/2018   WISDOM TOOTH EXTRACTION      Family History:  Family History  Problem Relation Age of Onset   Diabetes Mother    Hypertension Mother    CVA Maternal Grandmother     Social  History:  Social History   Socioeconomic History   Marital status: Married    Spouse name: Not on file   Number of children: Not on file   Years of education: Not on file   Highest education level: Not on file  Occupational History   Not on file  Social Needs   Financial resource strain: Not on file   Food insecurity    Worry: Not on file    Inability: Not on file   Transportation needs    Medical: Not on file    Non-medical: Not on file  Tobacco Use   Smoking status: Never Smoker   Smokeless tobacco: Never Used  Substance and Sexual Activity   Alcohol use: Yes    Comment: occasionally   Drug use: No   Sexual activity: Yes    Birth control/protection: None  Lifestyle   Physical activity    Days per week: Not on file    Minutes per session: Not on file   Stress: Not on file  Relationships   Social connections    Talks on phone: Not on file    Gets together: Not on file    Attends religious service: Not on file    Active member of club or organization: Not on file    Attends meetings of clubs or organizations: Not on file    Relationship status: Not on file   Intimate partner violence    Fear of current or ex partner: Not on file    Emotionally abused: Not on file  Physically abused: Not on file    Forced sexual activity: Not on file  Other Topics Concern   Not on file  Social History Narrative   3 children   Married   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              Allergies:  No Known Allergies  Medications: Prior to Admission medications   Medication Sig Start Date End Date Taking? Authorizing Provider  medroxyPROGESTERone (PROVERA) 10 MG tablet Take 1 tablet (10 mg total) by mouth daily. 02/08/19 03/10/19  Malachy Mood, MD  metoprolol succinate (TOPROL-XL) 50 MG 24 hr tablet Take 50 mg by mouth daily. Take with or immediately following a meal.    [provider]  omeprazole (PRILOSEC) 20 MG capsule Take  1 capsule (20 mg total) by mouth 2 (two) times daily before a meal. May reduce dosage to once daily if symptoms are well controlled. 07/15/17   Rexene Agent, CNM  sertraline (ZOLOFT) 25 MG tablet  02/11/19   [provider]  sodium chloride (ALTAMIST SPRAY) 0.65 % nasal spray 1 spray as needed for congestion.    [provider]  topiramate (TOPAMAX) 25 MG tablet  09/14/18   [provider]    Physical Exam Vitals: Blood pressure (!) 140/100, pulse 89, height 5\' 1"  (1.549 m), weight 183 lb (83 kg), last menstrual period 02/07/2019, unknown if currently breastfeeding.  General: NAD, well nourished, appears stated age 59: normocephalic, anicteric Pulmonary: CTAB, No increased work of breathing Cardiovascular: RRR, distal pulses 2+ Abdomen: soft, non-tender, non-distended Genitourinary:  External: Normal external female genitalia.  Normal urethral meatus, normal Bartholin's and Skene's glands.    Vagina: Normal vaginal mucosa, no evidence of prolapse.    Cervix: Grossly normal in appearance, no bleeding  Uterus: Non-enlarged, mobile, normal contour.  No CMT, minimal descensus noted.  Suspect some adhesive disease from prior cesarean  Adnexa: ovaries non-enlarged, no adnexal masses  Rectal: deferred  Lymphatic: no evidence of inguinal lymphadenopathy Extremities: no edema, erythema, or tenderness Neurologic: Grossly intact Psychiatric: mood appropriate, affect full    ENDOMETRIAL BIOPSY     The indications for endometrial biopsy were reviewed.   Risks of the biopsy including cramping, bleeding, infection, uterine perforation, inadequate specimen and need for additional procedures  were discussed. The patient states she understands and agrees to undergo procedure today. Consent was signed. Time out was performed. Urine HCG was negative. A Graves speculum was placed and the cervix was brought into view.  The cervix was prepped with Betadine. A single-toothed  tenaculum was placed on the anterior lip of the cervix for traction. A 3 mm pipelle was introduced through the cervix into the endometrial cavity without difficulty to a depth of 6cm, and a small amount of tissue was obtained, the resulting specime sent to pathology. The instruments were removed from the patient's vagina. Minimal bleeding from the cervix was noted. The patient tolerated the procedure well. Routine post-procedure instructions were given to the patient.  She will be contacted by phone one results become available.     Imaging No results found.  Assessment: 33 y.o. [id number] presenting for scheduled  TLH, BS, cystoscopy   Plan: 1) Patient opts for definitive surgical management via hysterectomy. The risks of surgery were discussed in detail with the patient including but not limited to: bleeding which may require transfusion or reoperation; infection which may require antibiotics; injury to bowel, bladder, ureters or other surrounding organs (  With a literature reported rate of urinary tract injury of 1% quoted); need for additional procedures including laparotomy; thromboembolic phenomenon, incisional problems and other postoperative/anesthesia complications.  Patient was also advised that recovery procedure generally involves an overnight stay; and the  expected recovery time after a hysterectomy being in the range of 6-8 weeks.  Likelihood of success in alleviating the patient's symptoms was discussed.  While definitive in regards to issues with menstural bleeding, pelvic pain if present preoperatively may continue and in fact worsen postoperatively.  She is aware that the procedure will render her unable to pursue childbearing in the future.   She was told that she will be contacted by our surgical scheduler regarding the time and date of her surgery; routine preoperative instructions of having nothing to eat or drink after midnight on the day prior to surgery and also coming to the hospital  1.5 hours prior to her time of surgery were also emphasized.  She was told she may be called for a preoperative appointment about a week prior to surgery and will be given further preoperative instructions at that visit.  Routine postoperative instructions will be reviewed with the patient and her family in detail after surgery. Printed patient education handouts about the procedure was given to the patient to review at home.   2) Routine postoperative instructions were reviewed with the patient and her family in detail today including the expected length of recovery and likely postoperative course.  The patient concurred with the proposed plan, giving informed written consent for the surgery today.  Patient instructed on the importance of being NPO after midnight prior to her procedure.  If warranted preoperative prophylactic antibiotics and SCDs ordered on call to the OR to meet SCIP guidelines and adhere to recommendation laid forth in [place] Number 104 May 2009  "Antibiotic Prophylaxis for Gynecologic Procedures".     Malachy Mood, MD, [facility], [facility] 03/04/2019, 10:39 AM

## 2019-03-08 ENCOUNTER — Telehealth: Payer: Self-pay

## 2019-03-08 LAB — SURGICAL PATHOLOGY

## 2019-03-08 NOTE — Telephone Encounter (Signed)
Pt calling for bx results.  [phone]

## 2019-03-08 NOTE — Telephone Encounter (Signed)
Bx results are back. Please cal pt

## 2019-03-10 ENCOUNTER — Telehealth: Payer: Self-pay | Admitting: Cardiovascular Disease

## 2019-03-10 ENCOUNTER — Encounter: Payer: Self-pay | Admitting: Cardiology

## 2019-03-10 ENCOUNTER — Ambulatory Visit (INDEPENDENT_AMBULATORY_CARE_PROVIDER_SITE_OTHER): Payer: BC Managed Care – PPO | Admitting: Cardiology

## 2019-03-10 ENCOUNTER — Other Ambulatory Visit: Payer: Self-pay

## 2019-03-10 VITALS — BP 150/110 | HR 98 | Ht 61.0 in | Wt 183.0 lb

## 2019-03-10 DIAGNOSIS — I10 Essential (primary) hypertension: Secondary | ICD-10-CM | POA: Diagnosis not present

## 2019-03-10 DIAGNOSIS — Z01818 Encounter for other preprocedural examination: Secondary | ICD-10-CM | POA: Diagnosis not present

## 2019-03-10 MED ORDER — AMLODIPINE BESYLATE 5 MG PO TABS: 5.0000 mg | ORAL_TABLET | Freq: Every day | ORAL | 3 refills | Status: DC

## 2019-03-10 NOTE — Patient Instructions (Addendum)
Medication Instructions:  - Your physician has recommended you make the following change in your medication:   1) Start norvasc (amlodipine) 5 mg- take 1 tablet by mouth once daily  If you need a refill on your cardiac medications before your next appointment, please call your pharmacy.   Lab work: - none ordered  If you have labs (blood work) drawn today and your tests are completely normal, you will receive your results only by: Marland Kitchen MyChart Message (if you have MyChart) OR . A paper copy in the mail If you have any lab test that is abnormal or we need to change your treatment, we will call you to review the results.  Testing/Procedures: - none ordered  Follow-Up: At [facility], you and your health needs are our priority.  As part of our continuing mission to provide you with exceptional heart care, we have created designated Provider Care Teams.  These Care Teams include your primary Cardiologist (physician) and Advanced Practice Providers (APPs -  Physician Assistants and Nurse Practitioners) who all work together to provide you with the care you need, when you need it. . in 2 months with Dr. Garen Lah  Any Other Special Instructions Will Be Listed Below (If Applicable). - Call with your blood pressure readings in 1 week

## 2019-03-10 NOTE — Progress Notes (Signed)
Cardiology Office Note:    Date:  03/10/2019   ID:  MCKYNZIE COWEN, DOB 1985-07-23, MRN [id number]  PCP:  [facility]  Cardiologist:  Kathlyn Sacramento, MD  Electrophysiologist:  None   Referring MD: [facility]   Chief Complaint  Patient presents with  . New Patient (Initial Visit)    surgical clearance    History of Present Illness:    Debra Whitney is a 33 y.o. female with a hx of hypertension, atrial fibrillation who presents for presurgical evaluation prior to a laparoscopic hysterectomy, bilateral salpingectomy.  Patient was seen in the clinic about 2 years ago.  At the time she was seen as a follow-up from the emergency room due to history of atrial fibrillation.  Atrial fibrillation or caught in the context of heavy alcohol use.  She was put on Eliquis for about 3 months.  She is also taking Toprol-XL 50 mg daily.  Echocardiogram at that time showed normal ejection fraction.  Patient denies having any similar episodes of palpitations.  She denies chest pain or shortness of breath at rest or with exertion.  She states occasionally getting headaches and feeling flushed, usually when she checks her blood pressure at home it is elevated, Around 0000000 systolic.    Past Medical History:  Diagnosis Date  . Atrial fibrillation ([place])    a. diagnosed on 10/20/2016; chads2vasc => at least 2 (HTN, sex category), possibly 3 given history of gestational diabetes  . Endometriosis   . GERD (gastroesophageal reflux disease)   . Gestational diabetes   . Hypertension   . Menorrhagia   . Migraine   . Obesity     Past Surgical History:  Procedure Laterality Date  . CESAREAN SECTION  01/28/2018  . DILATION AND CURETTAGE OF UTERUS    . endometrial surgery    . HERNIA REPAIR    . HYSTEROSCOPY W/D&C  11/29/2015   Procedure: DILATATION AND CURETTAGE /HYSTEROSCOPY;  Surgeon: Malachy Mood, MD;  Location: [facility] ORS;  Service: Gynecology;;  . LAPAROSCOPY N/A 11/29/2015   Procedure: LAPAROSCOPY DIAGNOSTIC;  Surgeon: Malachy Mood, MD;  Location: [facility] ORS;  Service: Gynecology;  Laterality: N/A;  . NASAL SINUS SURGERY    . TUBAL LIGATION  01/28/2018  . WISDOM TOOTH EXTRACTION      Current Medications: Current Meds  Medication Sig  . cetirizine (ZYRTEC) 10 MG tablet Take 10 mg by mouth daily.  . fluticasone (FLONASE) 50 MCG/ACT nasal spray Place 2 sprays into both nostrils daily.  . medroxyPROGESTERone (PROVERA) 10 MG tablet Take 1 tablet (10 mg total) by mouth daily.  . metoprolol succinate (TOPROL-XL) 50 MG 24 hr tablet Take 50 mg by mouth daily. Take with or immediately following a meal.  . omeprazole (PRILOSEC) 40 MG capsule Take 40 mg by mouth daily.  . sertraline (ZOLOFT) 25 MG tablet Take 25 mg by mouth daily.      Allergies:   Patient has no known allergies.   Social History   Socioeconomic History  . Marital status: Married    Spouse name: Not on file  . Number of children: Not on file  . Years of education: Not on file  . Highest education level: Not on file  Occupational History  . Not on file  Social Needs  . Financial resource strain: Not on file  . Food insecurity    Worry: Not on file    Inability: Not on file  . Transportation needs    Medical: Not on  file    Non-medical: Not on file  Tobacco Use  . Smoking status: Never Smoker  . Smokeless tobacco: Never Used  Substance and Sexual Activity  . Alcohol use: Yes    Comment: occasionally  . Drug use: No  . Sexual activity: Yes    Birth control/protection: None  Lifestyle  . Physical activity    Days per week: Not on file    Minutes per session: Not on file  . Stress: Not on file  Relationships  . Social Herbalist on phone: Not on file    Gets together: Not on file    Attends religious service: Not on file    Active member of club or organization: Not on file    Attends meetings of clubs or organizations: Not on file    Relationship status: Not on file   Other Topics Concern  . 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               Family History: The patient's family history includes CVA in her maternal grandmother; Diabetes in her mother; Hypertension in her mother.  ROS:   Please see the history of present illness.     All other systems reviewed and are negative.  EKGs/Labs/Other Studies Reviewed:    The following studies were reviewed today: Echocardiogram dated 10/20/2016 - Left ventricle: The cavity size was normal. Wall thickness was   increased in a pattern of mild LVH. Systolic function was normal.   The estimated ejection fraction was in the range of 60% to 65%. - Mitral valve: There was mild regurgitation. - Right ventricle: The cavity size was normal. Wall thickness was   normal. Systolic function was normal.   EKG:  EKG is  ordered today.  The ekg ordered today demonstrates normal sinus rhythm, normal EKG.  Recent Labs: 02/01/2019: BUN 8; Creatinine, Ser 0.53; Potassium 3.7; Sodium 137 02/18/2019: Hemoglobin 12.1; Platelets 410; TSH 1.120  Recent Lipid Panel    Component Value Date/Time   CHOL 147 10/21/2016 0137   TRIG 178 (H) 10/21/2016 0137   HDL 41 10/21/2016 0137   CHOLHDL 3.6 10/21/2016 0137   VLDL 36 10/21/2016 0137   LDLCALC 70 10/21/2016 0137    Physical Exam:    VS:  BP (!) 150/110 (BP Location: Right Arm, Patient Position: Sitting, Cuff Size: Normal)   Pulse 98   Ht 5\' 1"  (1.549 m)   Wt 183 lb (83 kg)   SpO2 98%   BMI 34.58 kg/m     Wt Readings from Last 3 Encounters:  03/10/19 183 lb (83 kg)  03/04/19 183 lb (83 kg)  02/18/19 183 lb (83 kg)     GEN:  Well nourished, well developed in no acute distress HEENT: Normal NECK: No JVD; No carotid bruits LYMPHATICS: No lymphadenopathy CARDIAC: RRR, no murmurs, rubs, gallops RESPIRATORY:  Clear to auscultation without rales, wheezing or rhonchi  ABDOMEN: Soft,  non-tender, non-distended MUSCULOSKELETAL:  No edema; No deformity  SKIN: Warm and dry NEUROLOGIC:  Alert and oriented x 3 PSYCHIATRIC:  Normal affect   ASSESSMENT:   The Revised Cardiac Risk Index indicates that her Perioperative Risk of Major Cardiac Event is (%): 0.9.  Therefore, she is at low risk for perioperative complications.    According to ACC/AHA guidelines, no further cardiovascular testing needed.  The patient may proceed  to surgery at acceptable risk.      1. Pre-op evaluation   2. Essential hypertension    PLAN:    In order of problems listed above:  1. Patient is a low cardiac risk for procedure.  Last echocardiogram showed normal ejection fraction.  She has not had any further episodes of atrial fibrillation or palpitation. 2. We will start amlodipine 5 mg daily.  Continue Toprol-XL 50 mg daily.  Patient to check blood pressure twice daily for the next week and call him with results.  Follow-up after her surgery which is scheduled in 2 weeks.  Total encounter time more than 40 minutes  Greater than 50% was spent in counseling and coordination of care with the patient  Medication Adjustments/Labs and Tests Ordered: Current medicines are reviewed at length with the patient today.  Concerns regarding medicines are outlined above.  Orders Placed This Encounter  Procedures  . EKG 12-Lead   Meds ordered this encounter  Medications  . amLODipine (NORVASC) 5 MG tablet    Sig: Take 1 tablet (5 mg total) by mouth daily.    Dispense:  90 tablet    Refill:  3    Patient Instructions  Medication Instructions:  - Your physician has recommended you make the following change in your medication:   1) Start norvasc (amlodipine) 5 mg- take 1 tablet by mouth once daily  If you need a refill on your cardiac medications before your next appointment, please call your pharmacy.   Lab work: - none ordered  If you have labs (blood work) drawn today and your tests are  completely normal, you will receive your results only by: Marland Kitchen MyChart Message (if you have MyChart) OR . A paper copy in the mail If you have any lab test that is abnormal or we need to change your treatment, we will call you to review the results.  Testing/Procedures: - none ordered  Follow-Up: At [facility], you and your health needs are our priority.  As part of our continuing mission to provide you with exceptional heart care, we have created designated Provider Care Teams.  These Care Teams include your primary Cardiologist (physician) and Advanced Practice Providers (APPs -  Physician Assistants and Nurse Practitioners) who all work together to provide you with the care you need, when you need it. . in 2 months with Dr. Garen Lah  Any Other Special Instructions Will Be Listed Below (If Applicable). - Call with your blood pressure readings in 1 week      Signed, Kate Sable, MD  03/10/2019 10:01 AM    [place]

## 2019-03-14 ENCOUNTER — Encounter: Payer: BC Managed Care – PPO | Admitting: Obstetrics and Gynecology

## 2019-03-15 ENCOUNTER — Encounter
Admission: RE | Admit: 2019-03-15 | Discharge: 2019-03-15 | Disposition: A | Discharge status: Home / Self Care | Payer: BC Managed Care – PPO | Source: Ambulatory Visit | Attending: Obstetrics and Gynecology | Admitting: Obstetrics and Gynecology

## 2019-03-15 ENCOUNTER — Other Ambulatory Visit: Payer: Self-pay

## 2019-03-15 DIAGNOSIS — Z01812 Encounter for preprocedural laboratory examination: Secondary | ICD-10-CM | POA: Diagnosis not present

## 2019-03-15 HISTORY — DX: Anxiety disorder, unspecified: F41.9

## 2019-03-15 HISTORY — DX: Depression, unspecified: F32.A

## 2019-03-15 HISTORY — DX: Other specified postprocedural states: R11.2

## 2019-03-15 HISTORY — DX: Nausea with vomiting, unspecified: Z98.890

## 2019-03-15 HISTORY — DX: Nausea with vomiting, unspecified: R11.2

## 2019-03-15 LAB — TYPE AND SCREEN
ABO/RH(D): A POS
Antibody Screen: NEGATIVE
Extend sample reason: UNDETERMINED

## 2019-03-15 LAB — BASIC METABOLIC PANEL
Anion gap: 9 (ref 5–15)
BUN: 7 mg/dL (ref 6–20)
CO2: 25 mmol/L (ref 22–32)
Calcium: 9.3 mg/dL (ref 8.9–10.3)
Chloride: 103 mmol/L (ref 98–111)
Creatinine, Ser: 0.56 mg/dL (ref 0.44–1.00)
GFR calc Af Amer: >60 mL/min (ref >60)
GFR calc non Af Amer: >60 mL/min (ref >60)
Glucose, Bld: 96 mg/dL (ref 70–99)
Potassium: 3.6 mmol/L (ref 3.5–5.1)
Sodium: 137 mmol/L (ref 135–145)

## 2019-03-15 NOTE — Patient Instructions (Signed)
Your procedure is scheduled on: Thursday 03/24/19.  Report to DAY SURGERY DEPARTMENT LOCATED ON 2ND FLOOR MEDICAL MALL ENTRANCE. To find out your arrival time please call [phone] between 1PM - 3PM on Wednesday 03/23/19.   Remember: Instructions that are not followed completely may result in serious medical risk, up to and including death, or upon the discretion of your surgeon and anesthesiologist your surgery may need to be rescheduled.      _X__ 1. Do not eat food after midnight the night before your procedure.                 No gum chewing or hard candies. You may drink clear liquids up to 2 hours                 before you are scheduled to arrive for your surgery- DO NOT drink clear                 liquids within 2 hours of the start of your surgery.                 Clear Liquids include:  water, apple juice without pulp, clear carbohydrate                 drink such as Clearfast or Gatorade, Black Coffee or Tea (Do not add                 milk or creamer to coffee or tea).  ** Dr. Georgianne Fick  Would like for you to finish your Ensure Pre-Surgery drink 2 hours prior to your arrival time on the morning of your surgery.**    __X__2.  On the morning of surgery brush your teeth with toothpaste and water, you may rinse your mouth with mouthwash if you wish.  Do not swallow any toothpaste or mouthwash.       _X__ 3.  No Alcohol for 24 hours before or after surgery.     __X__4.  Notify your doctor if there is any change in your medical condition      (cold, fever, infections).      Do not wear jewelry, make-up, hairpins, clips or nail polish. Do not wear lotions, powders, or perfumes.  Do not shave 48 hours prior to surgery. Men may shave face and neck. Do not bring valuables to the hospital.     [facility] is not responsible for any belongings or valuables.    Contacts, dentures/partials or body piercings may not be worn into surgery. Bring a case for your contacts,  glasses or hearing aids, a denture cup will be supplied.     Please read over the following fact sheets that you were given:   MRSA Information    __X__ Take these medicines the morning of surgery with A SIP OF WATER:     1. amLODipine (NORVASC) 10 MG tablet  2. cetirizine (ZYRTEC) 10 MG tablet  3. fluticasone (FLONASE) 50 MCG/ACT nasal spray  4. metoprolol succinate (TOPROL-XL) 50 MG 24 hr tablet  5. omeprazole (PRILOSEC) 40 MG capsule  6. sertraline (ZOLOFT) 25 MG tablet     __X__ Use CHG Soap as directed     __X__ Stop Anti-inflammatories 7 days before surgery such as Advil, Ibuprofen, Motrin, BC or Goodies Powder, Naprosyn, Naproxen, Aleve, Aspirin, Meloxicam. May take Tylenol if needed for pain or discomfort.     __X__ Don't start any new herbal supplements before your procedure.

## 2019-03-15 NOTE — Progress Notes (Signed)
Pre-Admit Testing Provider Notification Note  Provider Notified: Agbor-Etang  Notification Mode: Secure Chat  Reason: Continued Elevated BP. "Good morning. I see the cardiology clearance for this patient. She is in the office for her PAT visit this morning. Her BP is 146/102. She says that she has been taking her BP at home twice daily since her visit with you & diastolic has not been less than 100. Can you advise?"   Response: "increase norvasc to 10mg  daily. Thanks"  Additional Information: Advised patient to increase Norvasc to 10mg  daily and continue to monitor her BP twice daily.    Signed: Beulah Gandy, RN

## 2019-03-15 NOTE — Telephone Encounter (Signed)
Per Caryl Pina @ Pre-admit testing, the patient does not need another COVID test. She will let the patient know.

## 2019-03-24 ENCOUNTER — Encounter: Payer: Self-pay | Admitting: *Deleted

## 2019-03-24 ENCOUNTER — Observation Stay: Payer: BC Managed Care – PPO | Admitting: Registered Nurse

## 2019-03-24 ENCOUNTER — Other Ambulatory Visit: Payer: Self-pay

## 2019-03-24 ENCOUNTER — Observation Stay
Admission: AD | Admit: 2019-03-24 | Discharge: 2019-03-25 | Disposition: A | Discharge status: Home / Self Care | Payer: BC Managed Care – PPO | Source: Ambulatory Visit | Attending: Obstetrics and Gynecology | Admitting: Obstetrics and Gynecology

## 2019-03-24 ENCOUNTER — Encounter
Admission: AD | Disposition: A | Discharge status: Home / Self Care | Payer: Self-pay | Source: Ambulatory Visit | Attending: Obstetrics and Gynecology

## 2019-03-24 DIAGNOSIS — F419 Anxiety disorder, unspecified: Secondary | ICD-10-CM | POA: Insufficient documentation

## 2019-03-24 DIAGNOSIS — Z90710 Acquired absence of both cervix and uterus: Secondary | ICD-10-CM | POA: Diagnosis present

## 2019-03-24 DIAGNOSIS — N939 Abnormal uterine and vaginal bleeding, unspecified: Secondary | ICD-10-CM | POA: Diagnosis not present

## 2019-03-24 DIAGNOSIS — N800 Endometriosis of uterus: Secondary | ICD-10-CM | POA: Diagnosis not present

## 2019-03-24 DIAGNOSIS — Z793 Long term (current) use of hormonal contraceptives: Secondary | ICD-10-CM | POA: Insufficient documentation

## 2019-03-24 DIAGNOSIS — K219 Gastro-esophageal reflux disease without esophagitis: Secondary | ICD-10-CM | POA: Insufficient documentation

## 2019-03-24 DIAGNOSIS — Z6834 Body mass index (BMI) 34.0-34.9, adult: Secondary | ICD-10-CM | POA: Diagnosis not present

## 2019-03-24 DIAGNOSIS — N946 Dysmenorrhea, unspecified: Secondary | ICD-10-CM

## 2019-03-24 DIAGNOSIS — I10 Essential (primary) hypertension: Secondary | ICD-10-CM | POA: Insufficient documentation

## 2019-03-24 DIAGNOSIS — G43909 Migraine, unspecified, not intractable, without status migrainosus: Secondary | ICD-10-CM | POA: Insufficient documentation

## 2019-03-24 DIAGNOSIS — F329 Major depressive disorder, single episode, unspecified: Secondary | ICD-10-CM | POA: Diagnosis not present

## 2019-03-24 DIAGNOSIS — Z79899 Other long term (current) drug therapy: Secondary | ICD-10-CM | POA: Diagnosis not present

## 2019-03-24 DIAGNOSIS — N802 Endometriosis of fallopian tube: Secondary | ICD-10-CM | POA: Insufficient documentation

## 2019-03-24 DIAGNOSIS — E669 Obesity, unspecified: Secondary | ICD-10-CM | POA: Insufficient documentation

## 2019-03-24 HISTORY — PX: TOTAL LAPAROSCOPIC HYSTERECTOMY WITH SALPINGECTOMY: SHX6742

## 2019-03-24 HISTORY — DX: Cardiac arrhythmia, unspecified: I49.9

## 2019-03-24 HISTORY — PX: CYSTOSCOPY: SHX5120

## 2019-03-24 LAB — POCT PREGNANCY, URINE: Preg Test, Ur: NEGATIVE

## 2019-03-24 LAB — TYPE AND SCREEN
ABO/RH(D): A POS
Antibody Screen: NEGATIVE

## 2019-03-24 SURGERY — HYSTERECTOMY, TOTAL, LAPAROSCOPIC, WITH SALPINGECTOMY
Anesthesia: General

## 2019-03-24 MED ORDER — AMLODIPINE BESYLATE 10 MG PO TABS
10.0000 mg | ORAL_TABLET | Freq: Every day | ORAL | Status: DC
  Administered 2019-03-25: 10 mg via ORAL
  Filled 2019-03-24: qty 1

## 2019-03-24 MED ORDER — METOPROLOL SUCCINATE ER 25 MG PO TB24
50.0000 mg | ORAL_TABLET | Freq: Every day | ORAL | Status: DC
  Administered 2019-03-25: 50 mg via ORAL
  Filled 2019-03-24: qty 2

## 2019-03-24 MED ORDER — KETOROLAC TROMETHAMINE 30 MG/ML IJ SOLN
INTRAMUSCULAR | Status: DC | PRN
  Administered 2019-03-24: 30 mg via INTRAVENOUS

## 2019-03-24 MED ORDER — FENTANYL CITRATE (PF) 100 MCG/2ML IJ SOLN
INTRAMUSCULAR | Status: AC
  Administered 2019-03-24: 25 ug via INTRAVENOUS
  Filled 2019-03-24: qty 2

## 2019-03-24 MED ORDER — DEXAMETHASONE SODIUM PHOSPHATE 10 MG/ML IJ SOLN
INTRAMUSCULAR | Status: AC
  Filled 2019-03-24: qty 1

## 2019-03-24 MED ORDER — KETOROLAC TROMETHAMINE 30 MG/ML IJ SOLN
30.0000 mg | Freq: Four times a day (QID) | INTRAMUSCULAR | Status: DC | PRN
  Administered 2019-03-24: 30 mg via INTRAVENOUS
  Filled 2019-03-24: qty 1

## 2019-03-24 MED ORDER — LIDOCAINE HCL (CARDIAC) PF 100 MG/5ML IV SOSY
PREFILLED_SYRINGE | INTRAVENOUS | Status: DC | PRN
  Administered 2019-03-24: 100 mg via INTRAVENOUS

## 2019-03-24 MED ORDER — ONDANSETRON HCL 4 MG/2ML IJ SOLN
INTRAMUSCULAR | Status: AC
  Filled 2019-03-24: qty 2

## 2019-03-24 MED ORDER — MIDAZOLAM HCL 2 MG/2ML IJ SOLN
INTRAMUSCULAR | Status: DC | PRN
  Administered 2019-03-24: 2 mg via INTRAVENOUS

## 2019-03-24 MED ORDER — MORPHINE SULFATE (PF) 2 MG/ML IV SOLN: 1.0000 mg | INTRAVENOUS | Status: DC | PRN

## 2019-03-24 MED ORDER — DEXAMETHASONE SODIUM PHOSPHATE 10 MG/ML IJ SOLN
INTRAMUSCULAR | Status: DC | PRN
  Administered 2019-03-24: 4 mg via INTRAVENOUS

## 2019-03-24 MED ORDER — OXYCODONE-ACETAMINOPHEN 5-325 MG PO TABS
1.0000 | ORAL_TABLET | ORAL | Status: DC | PRN
  Administered 2019-03-24: 1 via ORAL
  Filled 2019-03-24: qty 1

## 2019-03-24 MED ORDER — FENTANYL CITRATE (PF) 100 MCG/2ML IJ SOLN
INTRAMUSCULAR | Status: AC
  Filled 2019-03-24: qty 2

## 2019-03-24 MED ORDER — DIPHENHYDRAMINE HCL 50 MG/ML IJ SOLN
INTRAMUSCULAR | Status: DC | PRN
  Administered 2019-03-24: 12.5 mg via INTRAVENOUS

## 2019-03-24 MED ORDER — FENTANYL CITRATE (PF) 100 MCG/2ML IJ SOLN
25.0000 ug | INTRAMUSCULAR | Status: DC | PRN
  Administered 2019-03-24 (×2): 25 ug via INTRAVENOUS

## 2019-03-24 MED ORDER — ACETAMINOPHEN 10 MG/ML IV SOLN
INTRAVENOUS | Status: DC | PRN
  Administered 2019-03-24: 1000 mg via INTRAVENOUS

## 2019-03-24 MED ORDER — FENTANYL CITRATE (PF) 100 MCG/2ML IJ SOLN
INTRAMUSCULAR | Status: DC | PRN
  Administered 2019-03-24: 50 ug via INTRAVENOUS
  Administered 2019-03-24 (×2): 25 ug via INTRAVENOUS
  Administered 2019-03-24: 50 ug via INTRAVENOUS

## 2019-03-24 MED ORDER — EPHEDRINE SULFATE 50 MG/ML IJ SOLN
INTRAMUSCULAR | Status: DC | PRN
  Administered 2019-03-24: 5 mg via INTRAVENOUS
  Administered 2019-03-24: 15 mg via INTRAVENOUS
  Administered 2019-03-24 (×2): 10 mg via INTRAVENOUS

## 2019-03-24 MED ORDER — FAMOTIDINE 20 MG PO TABS: 20.0000 mg | ORAL_TABLET | Freq: Once | ORAL | Status: DC

## 2019-03-24 MED ORDER — SUGAMMADEX SODIUM 200 MG/2ML IV SOLN
INTRAVENOUS | Status: DC | PRN
  Administered 2019-03-24: 200 mg via INTRAVENOUS

## 2019-03-24 MED ORDER — ROCURONIUM BROMIDE 50 MG/5ML IV SOLN
INTRAVENOUS | Status: AC
  Filled 2019-03-24: qty 1

## 2019-03-24 MED ORDER — PROPOFOL 10 MG/ML IV BOLUS
INTRAVENOUS | Status: AC
  Filled 2019-03-24: qty 20

## 2019-03-24 MED ORDER — BUPIVACAINE HCL 0.5 % IJ SOLN
INTRAMUSCULAR | Status: DC | PRN
  Administered 2019-03-24: 14.5 mL

## 2019-03-24 MED ORDER — ONDANSETRON HCL 4 MG/2ML IJ SOLN
4.0000 mg | Freq: Once | INTRAMUSCULAR | Status: AC | PRN
  Administered 2019-03-24: 4 mg via INTRAVENOUS

## 2019-03-24 MED ORDER — CEFAZOLIN SODIUM-DEXTROSE 2-4 GM/100ML-% IV SOLN
2.0000 g | INTRAVENOUS | Status: AC
  Administered 2019-03-24: 2 g via INTRAVENOUS

## 2019-03-24 MED ORDER — SODIUM CHLORIDE 0.9 % IV SOLN
INTRAVENOUS | Status: DC | PRN
  Administered 2019-03-24: 20 ug/min via INTRAVENOUS

## 2019-03-24 MED ORDER — SCOPOLAMINE 1 MG/3DAYS TD PT72
MEDICATED_PATCH | TRANSDERMAL | Status: AC
  Administered 2019-03-24: 1 via TRANSDERMAL
  Filled 2019-03-24: qty 1

## 2019-03-24 MED ORDER — CEFAZOLIN SODIUM-DEXTROSE 2-4 GM/100ML-% IV SOLN
INTRAVENOUS | Status: AC
  Filled 2019-03-24: qty 100

## 2019-03-24 MED ORDER — ONDANSETRON HCL 4 MG/2ML IJ SOLN
INTRAMUSCULAR | Status: DC | PRN
  Administered 2019-03-24: 4 mg via INTRAVENOUS

## 2019-03-24 MED ORDER — KETOROLAC TROMETHAMINE 30 MG/ML IJ SOLN
INTRAMUSCULAR | Status: AC
  Filled 2019-03-24: qty 1

## 2019-03-24 MED ORDER — EPHEDRINE SULFATE 50 MG/ML IJ SOLN
INTRAMUSCULAR | Status: AC
  Filled 2019-03-24: qty 1

## 2019-03-24 MED ORDER — DEXTROSE-NACL 5-0.45 % IV SOLN
INTRAVENOUS | Status: DC
  Administered 2019-03-24 (×2): via INTRAVENOUS

## 2019-03-24 MED ORDER — MIDAZOLAM HCL 2 MG/2ML IJ SOLN
INTRAMUSCULAR | Status: AC
  Filled 2019-03-24: qty 2

## 2019-03-24 MED ORDER — PROPOFOL 10 MG/ML IV BOLUS
INTRAVENOUS | Status: DC | PRN
  Administered 2019-03-24: 150 mg via INTRAVENOUS

## 2019-03-24 MED ORDER — SCOPOLAMINE 1 MG/3DAYS TD PT72
1.0000 | MEDICATED_PATCH | TRANSDERMAL | Status: DC
  Administered 2019-03-24: 07:00:00 1 via TRANSDERMAL

## 2019-03-24 MED ORDER — SUGAMMADEX SODIUM 200 MG/2ML IV SOLN
INTRAVENOUS | Status: AC
  Filled 2019-03-24: qty 2

## 2019-03-24 MED ORDER — PHENYLEPHRINE HCL (PRESSORS) 10 MG/ML IV SOLN
INTRAVENOUS | Status: AC
  Filled 2019-03-24: qty 1

## 2019-03-24 MED ORDER — SUCCINYLCHOLINE CHLORIDE 20 MG/ML IJ SOLN
INTRAMUSCULAR | Status: AC
  Filled 2019-03-24: qty 1

## 2019-03-24 MED ORDER — ROCURONIUM BROMIDE 100 MG/10ML IV SOLN
INTRAVENOUS | Status: DC | PRN
  Administered 2019-03-24: 10 mg via INTRAVENOUS
  Administered 2019-03-24: 50 mg via INTRAVENOUS

## 2019-03-24 MED ORDER — BUPIVACAINE HCL (PF) 0.5 % IJ SOLN
INTRAMUSCULAR | Status: AC
  Filled 2019-03-24: qty 30

## 2019-03-24 MED ORDER — ACETAMINOPHEN NICU IV SYRINGE 10 MG/ML
INTRAVENOUS | Status: AC
  Filled 2019-03-24: qty 1

## 2019-03-24 MED ORDER — MENTHOL 3 MG MT LOZG
1.0000 | LOZENGE | OROMUCOSAL | Status: DC | PRN
  Filled 2019-03-24: qty 9

## 2019-03-24 MED ORDER — LIDOCAINE HCL (PF) 2 % IJ SOLN
INTRAMUSCULAR | Status: AC
  Filled 2019-03-24: qty 10

## 2019-03-24 MED ORDER — SEVOFLURANE IN SOLN
RESPIRATORY_TRACT | Status: AC
  Filled 2019-03-24: qty 250

## 2019-03-24 MED ORDER — DIPHENHYDRAMINE HCL 50 MG/ML IJ SOLN
INTRAMUSCULAR | Status: AC
  Filled 2019-03-24: qty 1

## 2019-03-24 MED ORDER — LACTATED RINGERS IV SOLN
INTRAVENOUS | Status: DC
  Administered 2019-03-24: 09:00:00 via INTRAVENOUS
  Administered 2019-03-24: 07:00:00 50 mL/h via INTRAVENOUS

## 2019-03-24 MED ORDER — PHENYLEPHRINE HCL (PRESSORS) 10 MG/ML IV SOLN
INTRAVENOUS | Status: DC | PRN
  Administered 2019-03-24 (×4): 100 ug via INTRAVENOUS

## 2019-03-24 SURGICAL SUPPLY — 53 items
BAG URINE DRAINAGE (UROLOGICAL SUPPLIES) ×2 IMPLANT
BLADE SURG SZ11 CARB STEEL (BLADE) ×2 IMPLANT
CANISTER SUCT 1200ML W/VALVE (MISCELLANEOUS) ×2 IMPLANT
CATH FOLEY 2WAY  5CC 16FR (CATHETERS) ×1
CATH URTH 16FR FL 2W BLN LF (CATHETERS) ×1 IMPLANT
CHLORAPREP W/TINT 26 (MISCELLANEOUS) ×2 IMPLANT
COVER WAND RF STERILE (DRAPES) ×2 IMPLANT
DEFOGGER SCOPE WARMER CLEARIFY (MISCELLANEOUS) ×2 IMPLANT
DERMABOND ADVANCED (GAUZE/BANDAGES/DRESSINGS) ×1
DERMABOND ADVANCED .7 DNX12 (GAUZE/BANDAGES/DRESSINGS) ×1 IMPLANT
DEVICE SUTURE ENDOST 10MM (ENDOMECHANICALS) ×2 IMPLANT
GAUZE 4X4 16PLY RFD (DISPOSABLE) ×2 IMPLANT
GLOVE BIO SURGEON STRL SZ7 (GLOVE) ×8 IMPLANT
GLOVE INDICATOR 7.5 STRL GRN (GLOVE) ×5 IMPLANT
GOWN STRL REUS W/ TWL LRG LVL3 (GOWN DISPOSABLE) ×2 IMPLANT
GOWN STRL REUS W/ TWL XL LVL3 (GOWN DISPOSABLE) ×1 IMPLANT
GOWN STRL REUS W/TWL LRG LVL3 (GOWN DISPOSABLE) ×3
GOWN STRL REUS W/TWL XL LVL3 (GOWN DISPOSABLE) ×1
GRASPER SUT TROCAR 14GX15 (MISCELLANEOUS) ×2 IMPLANT
IRRIGATION STRYKERFLOW (MISCELLANEOUS) ×1 IMPLANT
IRRIGATOR STRYKERFLOW (MISCELLANEOUS) ×2
IV LACTATED RINGERS 1000ML (IV SOLUTION) ×2 IMPLANT
IV NS 1000ML (IV SOLUTION) ×1
IV NS 1000ML BAXH (IV SOLUTION) ×1 IMPLANT
KIT PINK PAD W/HEAD ARE REST (MISCELLANEOUS) ×2
KIT PINK PAD W/HEAD ARM REST (MISCELLANEOUS) ×1 IMPLANT
LABEL OR SOLS (LABEL) ×2 IMPLANT
MANIPULATOR VCARE LG CRV RETR (MISCELLANEOUS) ×1 IMPLANT
MANIPULATOR VCARE SML CRV RETR (MISCELLANEOUS) IMPLANT
MANIPULATOR VCARE STD CRV RETR (MISCELLANEOUS) IMPLANT
NS IRRIG 1000ML POUR BTL (IV SOLUTION) ×2 IMPLANT
NS IRRIG 500ML POUR BTL (IV SOLUTION) ×2 IMPLANT
OCCLUDER COLPOPNEUMO (BALLOONS) ×1 IMPLANT
PACK GYN LAPAROSCOPIC (MISCELLANEOUS) ×2 IMPLANT
PAD OB MATERNITY 4.3X12.25 (PERSONAL CARE ITEMS) ×2 IMPLANT
PAD PREP 24X41 OB/GYN DISP (PERSONAL CARE ITEMS) ×2 IMPLANT
SCISSORS METZENBAUM CVD 33 (INSTRUMENTS) ×2 IMPLANT
SET CYSTO W/LG BORE CLAMP LF (SET/KITS/TRAYS/PACK) ×2 IMPLANT
SHEARS HARMONIC ACE PLUS 36CM (ENDOMECHANICALS) ×2 IMPLANT
SLEEVE ENDOPATH XCEL 5M (ENDOMECHANICALS) ×2 IMPLANT
STRAP SAFETY 5IN WIDE (MISCELLANEOUS) ×2 IMPLANT
SURGILUBE 2OZ TUBE FLIPTOP (MISCELLANEOUS) ×2 IMPLANT
SUT ENDO VLOC 180-0-8IN (SUTURE) ×2 IMPLANT
SUT MNCRL 4-0 (SUTURE) ×2
SUT MNCRL 4-0 27XMFL (SUTURE) ×2
SUT VIC AB 0 CT1 27 (SUTURE) ×1
SUT VIC AB 0 CT1 27XCR 8 STRN (SUTURE) ×1 IMPLANT
SUTURE MNCRL 4-0 27XMF (SUTURE) ×2 IMPLANT
SYR 10ML LL (SYRINGE) ×2 IMPLANT
SYR 50ML LL SCALE MARK (SYRINGE) ×2 IMPLANT
TROCAR ENDO BLADELESS 11MM (ENDOMECHANICALS) ×2 IMPLANT
TROCAR XCEL NON-BLD 5MMX100MML (ENDOMECHANICALS) ×2 IMPLANT
TUBING EVAC SMOKE HEATED PNEUM (TUBING) ×2 IMPLANT

## 2019-03-24 NOTE — Anesthesia Preprocedure Evaluation (Signed)
Anesthesia Evaluation  Patient identified by MRN, date of birth, ID band Patient awake    Reviewed: Allergy & Precautions, NPO status , Patient's Chart, lab work & pertinent test results  History of Anesthesia Complications (+) PONV and history of anesthetic complications  Airway Mallampati: III       Dental   Pulmonary neg sleep apnea, neg COPD, Not current smoker,           Cardiovascular hypertension, Pt. on medications (-) Past MI and (-) CHF + dysrhythmias (hx of afib 2 years ago, chemically converted, no problems since) (-) Valvular Problems/Murmurs     Neuro/Psych neg Seizures Anxiety Depression    GI/Hepatic Neg liver ROS, GERD  Medicated,  Endo/Other  diabetes, Gestational  Renal/GU negative Renal ROS     Musculoskeletal   Abdominal   Peds  Hematology   Anesthesia Other Findings   Reproductive/Obstetrics                             Anesthesia Physical Anesthesia Plan  ASA: III  Anesthesia Plan: General   Post-op Pain Management:    Induction: Intravenous  PONV Risk Score and Plan: 4 or greater and Ondansetron, Scopolamine patch - Pre-op, Dexamethasone and Midazolam  Airway Management Planned: Nasal Cannula  Additional Equipment:   Intra-op Plan:   Post-operative Plan:   Informed Consent: I have reviewed the patients History and Physical, chart, labs and discussed the procedure including the risks, benefits and alternatives for the proposed anesthesia with the patient or authorized representative who has indicated his/her understanding and acceptance.       Plan Discussed with:   Anesthesia Plan Comments:         Anesthesia Quick Evaluation

## 2019-03-24 NOTE — Transfer of Care (Signed)
Immediate Anesthesia Transfer of Care Note  Patient: Debra Whitney  Procedure(s) Performed: TOTAL LAPAROSCOPIC HYSTERECTOMY WITH SALPINGECTOMY (N/A ) CYSTOSCOPY (N/A )  Patient Location: PACU  Anesthesia Type:General  Level of Consciousness: sedated  Airway & Oxygen Therapy: Patient connected to face mask oxygen  Post-op Assessment: Post -op Vital signs reviewed and stable  Post vital signs: stable  Last Vitals:  Vitals Value Taken Time  BP 105/67 03/24/19 0947  Temp    Pulse 96 03/24/19 0947  Resp 15 03/24/19 0947  SpO2 95 % 03/24/19 0947    Last Pain:  Vitals:   03/24/19 0612  TempSrc: Tympanic      Patients Stated Pain Goal: 0 ([id number] [phone])  Complications: No apparent anesthesia complications

## 2019-03-24 NOTE — Anesthesia Postprocedure Evaluation (Signed)
Anesthesia Post Note  Patient: Debra Whitney  Procedure(s) Performed: TOTAL LAPAROSCOPIC HYSTERECTOMY WITH SALPINGECTOMY (N/A ) CYSTOSCOPY (N/A )  Patient location during evaluation: PACU Anesthesia Type: General Level of consciousness: awake and alert Pain management: pain level controlled Vital Signs Assessment: post-procedure vital signs reviewed and stable Respiratory status: spontaneous breathing and respiratory function stable Cardiovascular status: stable Anesthetic complications: no     Last Vitals:  Vitals:   03/24/19 1000 03/24/19 1001  BP:  98/67  Pulse: 95 95  Resp: 16 19  Temp:    SpO2: 99% 99%    Last Pain:  Vitals:   03/24/19 0946  TempSrc:   PainSc: Asleep                 Jacobe Study K

## 2019-03-24 NOTE — H&P (Signed)
Date of Initial H&P: 03/04/2019  History reviewed, patient examined, no change in status, stable for surgery.

## 2019-03-24 NOTE — Progress Notes (Signed)
Dr. Georgianne Fick notified patient's HR trending up this afternoon from 90's to 110's, BP stable. Pt asymptomatic and stated "that is normal for me, I feel fine" and took her daily Metoprolol 50 mg and Amlodipine 10 mg this morning before surgery. Verbal order to notify MD if HR gets in 120-130 range.

## 2019-03-24 NOTE — Op Note (Signed)
Preoperative Diagnosis: 1) 33 y.o. [id number] with abnormal uterine bleeding and dysmenorrhea  Postoperative Diagnosis: 1) 33 y.o. [id number] with abnormal uterine bleeding and dysmenorrhea  Operation Performed: Total laparoscopic hysterectomy, bilateral salpingectomy, and cystoscopy  Indication: Abnormal uterine   Surgeon: Malachy Mood, MD  Assistant: Barnett Applebaum, MD this surgery required a high level surgical assistant with none other readily available  Anesthesia: General  Preoperative Antibiotics: none  Estimated Blood Loss: 50 mL  IV Fluids: 1L crystaloid  Urine Output:: 755mL  Drains or Tubes: none  Implants: none  Specimens Removed: Uterus, cervix, and bilateral fallopian tubes  Complications: none  Intraoperative Findings: Cervix grossly normal, uterus extremely anteverted, minimal descensus.  Tubes with evidence of prior bilateral salpingectomy, normal ovaries, uterus globular with suspicion for adenomyosis, significant adhesive disease of the bladder to the lower uterine segment.   Cystoscopy revealed intact bladder dome, no suture material within the bladder, and brisk efflux from both ureters.  Patient Condition: stable  Procedure in Detail:  Patient was taken to the operating room where she was administered general anesthesia.  She was positioned in the dorsal lithotomy position utilizing Allen stirups, prepped and draped in the usual sterile fashion.  Prior to proceeding with procedure a time out was performed.  Attention was turned to the patient's pelvis.  An indwelling foley catheter was placed to decompress the patient's bladder.  An operative speculum was placed to allow visualization of the cervix.  The anterior lip of the cervix was grasped with a single tooth tenaculum, and a large V-care uterine manipulator was placed to allow manipulation of the uterus.  The operative speculum and single tooth tenaculum were then removed.  Attention was turned to the  patient's abdomen.  The umbilicus was infiltrated with 1% Sensorcaine, before making a stab incision using an 11 blade scalpel.  A 72mm Excel trocar was then used to gain direct entry into the peritoneal cavity utilizing the camera to visualize progress of the trocar during placement.  Once peritoneal entry had been achieved, insufflation was started and pneumoperitoneum established at a pressure of 38mmHg.    One left and one right lower quadrant site were then injected with 1% Sensorcaine and a stab incision was made using an 11 blade scalpel.  Two additional 5mm Excel trocars were placed through these incisions under direct visualization, the umbilical trocar was stepped up to a 7mm Excel trocar. General inspection of the abdomen revealed the above noted findings. The V-care partially perforate the left posterior uterine segment.  The right tube was identified and grasped at its fimbriated end.  The tube was transected from its attachments to the ovary and mesosalpinx using a 42mm Harmonic scalpel.  The utero ovarian ligament was identified ligated and transected using the Harmonic scalpel. The round ligament was then likewise ligated and transected.  The anterior leaf of the broad ligament was dissected down to the level of the internal cervical os and a bladder flap was started.  The posterior leaf of the broad ligament was dissected down to the utero-sacral ligament.  Prior to proceeding the V-care was replaced to allow better manipulation of the uterus.  The uterine artery was skeletonized before being ligated and transected using the Harmonic scalpel with cephelad pressure applied to the V-care device to assure lateralization of the ureter.  A bite was then taken with Harmonic medial to transected portion of uterine artery to further lateralize the ureter and vessel off the V-care cup.  The patient left adnexal structures were  then dissected in similar fashion.  The bladder flap was completed and the bladder  mobilized off the V-care cup.  There was dense adhesive disease of the bladder to the lower uterine segment.  .  An anterior colpotomy was scores and carried around in a clockwise fashion to free the specimen, which was then removed vaginally.  Inspection revealed all pedicles to be hemostatic before proceed with vaginal closure. An endostich with a V-lock load was used to close the cuff in a running fashion ensuring good bites of vaginal mucosa.    The indwelling foley catheter was removed noting clear urine in the tubing, no air in the foley bag.  Cystoscopy was performed noting and intact bladder dome as well as brisk efflux of urine from bother ureteral orifices.  The cystoscopy was removed and the indwelling foley catheter was replaced.  Pneumoperitoneum was re-established, the pelvis was irrigated and all pedicles were once again inspected and noted to be hemostatic.  The [id number] umbilical trocar was removed and closed with a 0 Vicryl Carter-Thompson under direct visualization.  Pneumoperitoneum was evacuated and trocars were removed.  The 26mm trocar site was closed with 4-0 Monocryl in a subcuticular fashion.   All trocar sites were then dressed with surgical skin glue.  Sponge needle and instrument counts were correct time two.  The patient tolerated the procedure well and was taken to the recovery room in stable condition.

## 2019-03-24 NOTE — Anesthesia Post-op Follow-up Note (Signed)
Anesthesia QCDR form completed.        

## 2019-03-24 NOTE — Progress Notes (Signed)
Pt reports 5 out of 10 surgical pain. SBP upper 90s - 101; fluids infusing. Most recent BP 97/64. Called Dr. Ronelle Nigh to review VS and pain medication. Per MD, keep fluids open; may give 25mg  fentanyl for pain; reassess BP before giving additional doses. May transfer to floor when SBP 100 or greater.

## 2019-03-24 NOTE — Anesthesia Procedure Notes (Signed)
Procedure Name: Intubation Date/Time: 03/24/2019 7:37 AM Performed by: Earna Coder, RN Pre-anesthesia Checklist: Patient identified, Patient being monitored, Timeout performed, Emergency Drugs available and Suction available Patient Re-evaluated:Patient Re-evaluated prior to induction Oxygen Delivery Method: Circle system utilized Preoxygenation: Pre-oxygenation with 100% oxygen Induction Type: IV induction Ventilation: Mask ventilation without difficulty Laryngoscope Size: 3 and McGraph Grade View: Grade I Tube type: Oral Tube size: 7.0 mm Number of attempts: 1 Airway Equipment and Method: Stylet Placement Confirmation: ETT inserted through vocal cords under direct vision,  positive ETCO2 and breath sounds checked- equal and bilateral Secured at: 20 cm Tube secured with: Tape Dental Injury: Teeth and Oropharynx as per pre-operative assessment

## 2019-03-25 DIAGNOSIS — N939 Abnormal uterine and vaginal bleeding, unspecified: Secondary | ICD-10-CM | POA: Diagnosis not present

## 2019-03-25 LAB — CBC
HCT: 35.3 % — ABNORMAL LOW (ref 36.0–46.0)
Hemoglobin: 11 g/dL — ABNORMAL LOW (ref 12.0–15.0)
MCH: 27.6 pg (ref 26.0–34.0)
MCHC: 31.2 g/dL (ref 30.0–36.0)
MCV: 88.5 fL (ref 80.0–100.0)
Platelets: 319 10*3/uL (ref 150–400)
RBC: 3.99 MIL/uL (ref 3.87–5.11)
RDW: 13.1 % (ref 11.5–15.5)
WBC: 12.6 10*3/uL — ABNORMAL HIGH (ref 4.0–10.5)
nRBC: 0 % (ref 0.0–0.2)

## 2019-03-25 LAB — BASIC METABOLIC PANEL
Anion gap: 7 (ref 5–15)
BUN: <5 mg/dL — ABNORMAL LOW (ref 6–20)
CO2: 24 mmol/L (ref 22–32)
Calcium: 8.3 mg/dL — ABNORMAL LOW (ref 8.9–10.3)
Chloride: 110 mmol/L (ref 98–111)
Creatinine, Ser: 0.45 mg/dL (ref 0.44–1.00)
GFR calc Af Amer: >60 mL/min (ref >60)
GFR calc non Af Amer: >60 mL/min (ref >60)
Glucose, Bld: 123 mg/dL — ABNORMAL HIGH (ref 70–99)
Potassium: 3.8 mmol/L (ref 3.5–5.1)
Sodium: 141 mmol/L (ref 135–145)

## 2019-03-25 LAB — SURGICAL PATHOLOGY

## 2019-03-25 MED ORDER — IBUPROFEN 600 MG PO TABS: 600.0000 mg | ORAL_TABLET | Freq: Four times a day (QID) | ORAL | 3 refills | Status: AC | PRN

## 2019-03-25 MED ORDER — OXYCODONE-ACETAMINOPHEN 5-325 MG PO TABS: 1.0000 | ORAL_TABLET | ORAL | 0 refills | Status: DC | PRN

## 2019-03-25 NOTE — Discharge Summary (Signed)
Physician Discharge Summary  Patient ID: Debra Whitney MRN: [id number] DOB/AGE: December 25, 1985 33 y.o.  Admit date: 03/24/2019 Discharge date: 03/25/2019  Admission Diagnoses: Scheduled hysterectomy  Discharge Diagnoses:  Active Problems:   S/P hysterectomy   Discharged Condition: good  Hospital Course: 33 year old with dysmenorrhea and abnormal uterine bleeding presenting for scheduled total laparoscopic hysterectomy, bilateral salpingectomy and cystoscopy which proceeded without incidence.  Postoperatively patient did well.  She has her foley catheter removed on POD 0.  At the time of discharge the patient was meeting all postoperative goals including voiding, ambulating, tolerating po, reporting good pain control on po analgesics.  She remained hemodynamically stable and afebrile throughout admission.  POD1 labs revealed stable H&H and BUN/Cr.   Consults: None  Significant Diagnostic Studies:  Results for orders placed or performed during the hospital encounter of 03/24/19 (from the past 24 hour(s))  CBC     Status: Abnormal   Collection Time: 03/25/19  6:58 AM  Result Value Ref Range   WBC 12.6 (H) 4.0 - 10.5 K/uL   RBC 3.99 3.87 - 5.11 MIL/uL   Hemoglobin 11.0 (L) 12.0 - 15.0 g/dL   HCT 35.3 (L) 36.0 - 46.0 %   MCV 88.5 80.0 - 100.0 fL   MCH 27.6 26.0 - 34.0 pg   MCHC 31.2 30.0 - 36.0 g/dL   RDW 13.1 11.5 - 15.5 %   Platelets 319 150 - 400 K/uL   nRBC 0.0 0.0 - 0.2 %  Basic metabolic panel     Status: Abnormal   Collection Time: 03/25/19  6:58 AM  Result Value Ref Range   Sodium 141 135 - 145 mmol/L   Potassium 3.8 3.5 - 5.1 mmol/L   Chloride 110 98 - 111 mmol/L   CO2 24 22 - 32 mmol/L   Glucose, Bld 123 (H) 70 - 99 mg/dL   BUN <5 (L) 6 - 20 mg/dL   Creatinine, Ser 0.45 0.44 - 1.00 mg/dL   Calcium 8.3 (L) 8.9 - 10.3 mg/dL   GFR calc non Af Amer >60 >60 mL/min   GFR calc Af Amer >60 >60 mL/min   Anion gap 7 5 - 15     Treatments: TLH, BS, and  cystoscopy  Discharge Exam: Temp:  [97.1 F (36.2 C)-98.7 F (37.1 C)] 98.4 F (36.9 C) (10/16 0302) Pulse Rate:  [79-115] 92 (10/16 0302) Resp:  [13-20] 20 (10/16 0302) BP: (94-123)/(62-80) 115/76 (10/16 0302) SpO2:  [92 %-100 %] 99 % (10/16 0302)  Intake/Output Summary (Last 24 hours) at 03/25/2019 0837 Last data filed at 03/24/2019 2325 Gross per 24 hour  Intake 2618.57 ml  Output 3270 ml  Net -651.43 ml     General appearance: alert, appears stated age and no distress Resp: no increased work of breathing GI: soft, non-tender; bowel sounds normal; no masses,  no organomegaly Extremities: no edema, redness or tenderness in the calves or thighs Incision/Wound: D/C/I  Disposition: Discharge disposition: 01-Home or Self Care     home  Discharge Instructions    Call MD for:   Complete by: As directed    Heavy vaginal bleeding greater than 1 pad an hour   Call MD for:  difficulty breathing, headache or visual disturbances   Complete by: As directed    Call MD for:  extreme fatigue   Complete by: As directed    Call MD for:  hives   Complete by: As directed    Call MD for:  persistant dizziness or  light-headedness   Complete by: As directed    Call MD for:  persistant nausea and vomiting   Complete by: As directed    Call MD for:  redness, tenderness, or signs of infection (pain, swelling, redness, odor or green/yellow discharge around incision site)   Complete by: As directed    Call MD for:  severe uncontrolled pain   Complete by: As directed    Call MD for:  temperature >100.4   Complete by: As directed    Diet general   Complete by: As directed    Discharge wound care:   Complete by: As directed    You may apply a light dressing for minor discharge from the incision or to keep waistbands of clothing from rubbing.  You may also have been discharge with a clear dressing in which case this will be removed at your postoperative clinic visit.  You may shower, use  soap on your incision.  Avoid baths or soaking the incision in the first 6 weeks following your surgery..   Driving restriction   Complete by: As directed    Avoid driving for at least 1 weeks or while taking prescription narcotics.   Lifting restrictions   Complete by: As directed    Weight restriction of 10lbs for 6 weeks.     Allergies as of 03/25/2019   No Known Allergies     Medication List    STOP taking these medications   medroxyPROGESTERone 10 MG tablet Commonly known as: Provera     TAKE these medications   amLODipine 5 MG tablet Commonly known as: NORVASC Take 1 tablet (5 mg total) by mouth daily. What changed: how much to take   cetirizine 10 MG tablet Commonly known as: ZYRTEC Take 10 mg by mouth daily.   fluticasone 50 MCG/ACT nasal spray Commonly known as: FLONASE Place 2 sprays into both nostrils daily.   ibuprofen 600 MG tablet Commonly known as: ADVIL Take 1 tablet (600 mg total) by mouth every 6 (six) hours as needed.   metoprolol succinate 50 MG 24 hr tablet Commonly known as: TOPROL-XL Take 50 mg by mouth daily. Take with or immediately following a meal.   omeprazole 40 MG capsule Commonly known as: PRILOSEC Take 40 mg by mouth daily.   oxyCODONE-acetaminophen 5-325 MG tablet Commonly known as: PERCOCET/ROXICET Take 1 tablet by mouth every 4 (four) hours as needed for moderate pain.   sertraline 25 MG tablet Commonly known as: ZOLOFT Take 25 mg by mouth daily.            Discharge Care Instructions  (From admission, onward)         Start     Ordered   03/25/19 0000  Discharge wound care:    Comments: You may apply a light dressing for minor discharge from the incision or to keep waistbands of clothing from rubbing.  You may also have been discharge with a clear dressing in which case this will be removed at your postoperative clinic visit.  You may shower, use soap on your incision.  Avoid baths or soaking the incision in the  first 6 weeks following your surgery..   03/25/19 [id number]         Follow-up Information    Malachy Mood, MD. Go in 1 week(s).   Specialty: Obstetrics and Gynecology Contact information: [address] [place] [address] [phone]           Signed: Malachy Mood 03/25/2019, 8:37 AM

## 2019-03-25 NOTE — Progress Notes (Signed)
Patient discharged home with friend. Discharge instructions and prescriptions given and reviewed with patient. Patient verbalized understanding. Escorted out by staff.

## 2019-03-28 ENCOUNTER — Ambulatory Visit: Payer: BC Managed Care – PPO | Admitting: Obstetrics and Gynecology

## 2019-03-28 ENCOUNTER — Other Ambulatory Visit: Payer: Self-pay | Admitting: Obstetrics and Gynecology

## 2019-03-28 ENCOUNTER — Telehealth: Payer: Self-pay | Admitting: Obstetrics and Gynecology

## 2019-03-28 MED ORDER — FLUCONAZOLE 150 MG PO TABS: 150.0000 mg | ORAL_TABLET | Freq: Once | ORAL | 0 refills | Status: AC

## 2019-03-28 MED ORDER — SULFAMETHOXAZOLE-TRIMETHOPRIM 800-160 MG PO TABS: 1.0000 | ORAL_TABLET | Freq: Two times a day (BID) | ORAL | 0 refills | Status: DC

## 2019-03-28 NOTE — Telephone Encounter (Signed)
Does pt need to keep appointment today or wait 48 hours to be seen since meds are being sent in? Pt is fine with either but wasn't sure. Please let pt know.

## 2019-03-28 NOTE — Telephone Encounter (Signed)
Please advise 

## 2019-03-28 NOTE — Progress Notes (Signed)
Port site cellulitis Rx bactrim DS

## 2019-03-30 ENCOUNTER — Other Ambulatory Visit: Payer: Self-pay

## 2019-03-30 ENCOUNTER — Inpatient Hospital Stay
Admission: AD | Admit: 2019-03-30 | Discharge: 2019-04-04 | DRG: 863 | Disposition: A | Discharge status: Home / Self Care | Payer: BC Managed Care – PPO | Attending: Obstetrics and Gynecology | Admitting: Obstetrics and Gynecology

## 2019-03-30 ENCOUNTER — Encounter: Payer: Self-pay | Admitting: Obstetrics and Gynecology

## 2019-03-30 ENCOUNTER — Ambulatory Visit: Payer: BC Managed Care – PPO | Admitting: Obstetrics and Gynecology

## 2019-03-30 ENCOUNTER — Ambulatory Visit (INDEPENDENT_AMBULATORY_CARE_PROVIDER_SITE_OTHER): Payer: BC Managed Care – PPO | Admitting: Obstetrics and Gynecology

## 2019-03-30 VITALS — BP 125/83 | HR 95 | Temp 98.2°F | Wt 186.0 lb

## 2019-03-30 DIAGNOSIS — Z20828 Contact with and (suspected) exposure to other viral communicable diseases: Secondary | ICD-10-CM | POA: Diagnosis present

## 2019-03-30 DIAGNOSIS — L03311 Cellulitis of abdominal wall: Secondary | ICD-10-CM | POA: Diagnosis present

## 2019-03-30 DIAGNOSIS — Z90710 Acquired absence of both cervix and uterus: Secondary | ICD-10-CM

## 2019-03-30 DIAGNOSIS — T8149XA Infection following a procedure, other surgical site, initial encounter: Secondary | ICD-10-CM

## 2019-03-30 DIAGNOSIS — T8141XA Infection following a procedure, superficial incisional surgical site, initial encounter: Principal | ICD-10-CM | POA: Diagnosis present

## 2019-03-30 LAB — CBC WITH DIFFERENTIAL/PLATELET
Abs Immature Granulocytes: 0.02 10*3/uL (ref 0.00–0.07)
Basophils Absolute: 0.1 10*3/uL (ref 0.0–0.1)
Basophils Relative: 1 %
Eosinophils Absolute: 0.3 10*3/uL (ref 0.0–0.5)
Eosinophils Relative: 3 %
HCT: 38.7 % (ref 36.0–46.0)
Hemoglobin: 12.2 g/dL (ref 12.0–15.0)
Immature Granulocytes: 0 %
Lymphocytes Relative: 32 %
Lymphs Abs: 2.7 10*3/uL (ref 0.7–4.0)
MCH: 27.5 pg (ref 26.0–34.0)
MCHC: 31.5 g/dL (ref 30.0–36.0)
MCV: 87.4 fL (ref 80.0–100.0)
Monocytes Absolute: 0.4 10*3/uL (ref 0.1–1.0)
Monocytes Relative: 5 %
Neutro Abs: 4.8 10*3/uL (ref 1.7–7.7)
Neutrophils Relative %: 59 %
Platelets: 376 10*3/uL (ref 150–400)
RBC: 4.43 MIL/uL (ref 3.87–5.11)
RDW: 12.8 % (ref 11.5–15.5)
WBC: 8.2 10*3/uL (ref 4.0–10.5)
nRBC: 0 % (ref 0.0–0.2)

## 2019-03-30 MED ORDER — VANCOMYCIN HCL 10 G IV SOLR
2000.0000 mg | Freq: Once | INTRAVENOUS | Status: AC
  Administered 2019-03-30: 21:00:00 2000 mg via INTRAVENOUS
  Filled 2019-03-30: qty 2000

## 2019-03-30 MED ORDER — VANCOMYCIN HCL 10 G IV SOLR
1500.0000 mg | INTRAVENOUS | Status: DC
  Filled 2019-03-30: qty 1500

## 2019-03-30 MED ORDER — PRENATAL MULTIVITAMIN CH
1.0000 | ORAL_TABLET | Freq: Every day | ORAL | Status: DC
  Administered 2019-03-31 – 2019-04-01 (×2): 1 via ORAL
  Filled 2019-03-30 (×2): qty 1

## 2019-03-30 MED ORDER — VANCOMYCIN HCL IN DEXTROSE 1-5 GM/200ML-% IV SOLN: 1000.0000 mg | Freq: Two times a day (BID) | INTRAVENOUS | Status: DC

## 2019-03-30 MED ORDER — ONDANSETRON HCL 4 MG PO TABS: 4.0000 mg | ORAL_TABLET | Freq: Four times a day (QID) | ORAL | Status: DC | PRN

## 2019-03-30 MED ORDER — ONDANSETRON HCL 4 MG/2ML IJ SOLN: 4.0000 mg | Freq: Four times a day (QID) | INTRAMUSCULAR | Status: DC | PRN

## 2019-03-30 MED ORDER — IBUPROFEN 600 MG PO TABS
600.0000 mg | ORAL_TABLET | Freq: Four times a day (QID) | ORAL | Status: DC | PRN
  Administered 2019-03-31 – 2019-04-03 (×3): 600 mg via ORAL
  Filled 2019-03-30 (×3): qty 1

## 2019-03-30 MED ORDER — SODIUM CHLORIDE 0.9 % IV SOLN
INTRAVENOUS | Status: DC
  Administered 2019-03-30 – 2019-04-03 (×5): via INTRAVENOUS

## 2019-03-30 MED ORDER — OXYCODONE-ACETAMINOPHEN 5-325 MG PO TABS: 1.0000 | ORAL_TABLET | ORAL | Status: DC | PRN

## 2019-03-30 NOTE — Progress Notes (Signed)
Obstetrics & Gynecology Consult H&P   Chief Complaint: Cellulitis postoperatively  History of Present Illness: Patient is a 33 y.o. [id number] s/p laproscopic TLH, BS, and cystoscopy 03/24/2019 who started developing redness around there left lower quadrant port site 2 days ago.  She was started on po bactrim, borders were marked.  Now 48-hrs post bactrim the patient presents to the office with no improvement in the area of concern with some spread past the initially marked borders.  She does reports warmth and tenderness to the area.  Had an area that looked as if it would discharge but never did.  No fevers or chills.  She does work in the the healthcare setting and bactrim was chosen for MRSA coverage.  No prior diagnosis or history of MRSA.  Received Ancef perioperatively for surgical prophylaxis.  Has only used percocet at night otherwise doing well postoperatively  Review of Systems:10 point review of systems  Past Medical History:  Past Medical History:  Diagnosis Date  . Anxiety   . Atrial fibrillation ([place])    a. diagnosed on 10/20/2016; chads2vasc => at least 2 (HTN, sex category), possibly 3 given history of gestational diabetes  . Depression   . Dysrhythmia    afib and tachy rates  . Endometriosis   . GERD (gastroesophageal reflux disease)   . Gestational diabetes   . Hypertension   . Menorrhagia   . Migraine   . Obesity   . PONV (postoperative nausea and vomiting)     Past Surgical History:  Past Surgical History:  Procedure Laterality Date  . CESAREAN SECTION  01/28/2018  . CYSTOSCOPY N/A 03/24/2019   Procedure: CYSTOSCOPY;  Surgeon: Malachy Mood, MD;  Location: [facility] ORS;  Service: Gynecology;  Laterality: N/A;  . DILATION AND CURETTAGE OF UTERUS    . endometrial surgery    . HERNIA REPAIR     umbilical  . HYSTEROSCOPY W/D&C  11/29/2015   Procedure: DILATATION AND CURETTAGE /HYSTEROSCOPY;  Surgeon: Malachy Mood, MD;  Location: [facility] ORS;  Service:  Gynecology;;  . LAPAROSCOPY N/A 11/29/2015   Procedure: LAPAROSCOPY DIAGNOSTIC;  Surgeon: Malachy Mood, MD;  Location: [facility] ORS;  Service: Gynecology;  Laterality: N/A;  . NASAL SINUS SURGERY    . TOTAL LAPAROSCOPIC HYSTERECTOMY WITH SALPINGECTOMY N/A 03/24/2019   Procedure: TOTAL LAPAROSCOPIC HYSTERECTOMY WITH SALPINGECTOMY;  Surgeon: Malachy Mood, MD;  Location: [facility] ORS;  Service: Gynecology;  Laterality: N/A;  . TUBAL LIGATION  01/28/2018  . WISDOM TOOTH EXTRACTION      Obstetric History: [id number]  Family History:  Family History  Problem Relation Age of Onset  . Diabetes Mother   . Hypertension Mother   . CVA Maternal Grandmother     Social History:  Social History   Socioeconomic History  . Marital status: Married    Spouse name: michael  . Number of children: 4  . Years of education: Not on file  . Highest education level: Not on file  Occupational History  . Occupation: homemaker  Social Needs  . Financial resource strain: Not on file  . Food insecurity    Worry: Not on file    Inability: Not on file  . Transportation needs    Medical: Not on file    Non-medical: Not on file  Tobacco Use  . Smoking status: Never Smoker  . Smokeless tobacco: Never Used  Substance and Sexual Activity  . Alcohol use: Yes    Comment: occasionally  . Drug use: No  . Sexual activity: Yes  Birth control/protection: None  Lifestyle  . Physical activity    Days per week: Not on file    Minutes per session: Not on file  . Stress: Not on file  Relationships  . Social Herbalist on phone: Not on file    Gets together: Not on file    Attends religious service: Not on file    Active member of club or organization: Not on file    Attends meetings of clubs or organizations: Not on file    Relationship status: Not on file  . Intimate partner violence    Fear of current or ex partner: Not on file    Emotionally abused: Not on file    Physically abused: Not on  file    Forced sexual activity: Not on file  Other Topics Concern  . Not on file  Social History Narrative   4 children   Married                    Allergies:  No Known Allergies  Medications: Prior to Admission medications   Medication Sig Start Date End Date Taking? Authorizing Provider  amLODipine (NORVASC) 5 MG tablet Take 1 tablet (5 mg total) by mouth daily. Patient taking differently: Take 10 mg by mouth daily.  03/10/19 06/08/19  Kate Sable, MD  cetirizine (ZYRTEC) 10 MG tablet Take 10 mg by mouth daily.    [provider]  fluticasone (FLONASE) 50 MCG/ACT nasal spray Place 2 sprays into both nostrils daily.    [provider]  ibuprofen (ADVIL) 600 MG tablet Take 1 tablet (600 mg total) by mouth every 6 (six) hours as needed. 03/25/19   Malachy Mood, MD  metoprolol succinate (TOPROL-XL) 50 MG 24 hr tablet Take 50 mg by mouth daily. Take with or immediately following a meal.    [provider]  omeprazole (PRILOSEC) 40 MG capsule Take 40 mg by mouth daily.    [provider]  oxyCODONE-acetaminophen (PERCOCET/ROXICET) 5-325 MG tablet Take 1 tablet by mouth every 4 (four) hours as needed for moderate pain. 03/25/19   Malachy Mood, MD  sertraline (ZOLOFT) 25 MG tablet Take 25 mg by mouth daily.  02/11/19   [provider]  sulfamethoxazole-trimethoprim (BACTRIM DS) 800-160 MG tablet Take 1 tablet by mouth 2 (two) times daily for 10 days. 03/28/19 04/07/19  Malachy Mood, MD    Physical Exam Vitals: Blood pressure 125/83, pulse 95, temperature 98.2 F (36.8 C), weight 186 lb (84.4 kg). General: NAD, well nourished, appears stated age 27: normocephalic, anicteric Pulmonary: No increased work of breathing Cardiovascular: RRR, distal pulses 2+ Abdomen: Soft, non-distended, tenderness over LLQ port site with redness.  No discharge, skin intact.  Umbilical and RLQ port site D/C/I Extremities: no edema,  erythema, or tenderness Neurologic: Grossly intact Psychiatric: mood appropriate, affect full  Labs: No results found for this or any previous visit (from the past 72 hour(s)).  Imaging No results found.  Assessment: 33 y.o. [id number] with left trocar site cellulitis  Plan:  1) Cellulitis - no evidence of drainage amenable to culture.  Will empirically cover with vancomycin and monitor for improvement - Vancomycin - CBC - Creatinine   2) FEN - general diet  3) DVT ppx - ambulatory  4) Disposition - pending improvement anticipate 24-48 course of antibiotics  Malachy Mood, MD, [facility] 03/30/2019, 12:11 PM

## 2019-03-30 NOTE — Consult Note (Signed)
Pharmacy Antibiotic Note  Debra Whitney is a 33 y.o. female admitted on 03/30/2019 with cellulitis. Per chart review, patient was on bactrim d/t erythema at port site. However, erythema spread. 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 Pharmacy has been consulted for Vancomycin dosing.  Plan: Vancomycin:  Loading dose: 2g x1 dose  Maintenance dose: 1500 mg Q24H Scr 0.45 (rounded up to 0.80)  AUC goal 400-550 Expected AUC: 530.2     Temp (24hrs), Avg:98.5 F (36.9 C), Min:98.2 F (36.8 C), Max:98.7 F (37.1 C)  Recent Labs  Lab 03/25/19 0658  WBC 12.6*  CREATININE 0.45    Estimated Creatinine Clearance: 98.5 mL/min (by C-G formula based on SCr of 0.45 mg/dL).    No Known Allergies  Antimicrobials this admission: 10/21 Vancomycin >>   Thank you for allowing pharmacy to be a part of this patient's care.  Rowland Lathe 03/30/2019 6:52 PM

## 2019-03-30 NOTE — H&P (Signed)
Date of Initial H&P: *03/30/2019 Clinic H&P  History reviewed, patient examined, no change in status.  Results for orders placed or performed during the hospital encounter of 03/30/19 (from the past 24 hour(s))  CBC WITH DIFFERENTIAL     Status: None   Collection Time: 03/30/19  7:22 PM  Result Value Ref Range   WBC 8.2 4.0 - 10.5 K/uL   RBC 4.43 3.87 - 5.11 MIL/uL   Hemoglobin 12.2 12.0 - 15.0 g/dL   HCT 38.7 36.0 - 46.0 %   MCV 87.4 80.0 - 100.0 fL   MCH 27.5 26.0 - 34.0 pg   MCHC 31.5 30.0 - 36.0 g/dL   RDW 12.8 11.5 - 15.5 %   Platelets 376 150 - 400 K/uL   nRBC 0.0 0.0 - 0.2 %   Neutrophils Relative % 59 %   Neutro Abs 4.8 1.7 - 7.7 K/uL   Lymphocytes Relative 32 %   Lymphs Abs 2.7 0.7 - 4.0 K/uL   Monocytes Relative 5 %   Monocytes Absolute 0.4 0.1 - 1.0 K/uL   Eosinophils Relative 3 %   Eosinophils Absolute 0.3 0.0 - 0.5 K/uL   Basophils Relative 1 %   Basophils Absolute 0.1 0.0 - 0.1 K/uL   Immature Granulocytes 0 %   Abs Immature Granulocytes 0.02 0.00 - 0.07 K/uL

## 2019-03-31 DIAGNOSIS — L0390 Cellulitis, unspecified: Secondary | ICD-10-CM | POA: Diagnosis not present

## 2019-03-31 DIAGNOSIS — Z20828 Contact with and (suspected) exposure to other viral communicable diseases: Secondary | ICD-10-CM | POA: Diagnosis present

## 2019-03-31 DIAGNOSIS — L03311 Cellulitis of abdominal wall: Secondary | ICD-10-CM | POA: Diagnosis present

## 2019-03-31 DIAGNOSIS — T8141XA Infection following a procedure, superficial incisional surgical site, initial encounter: Secondary | ICD-10-CM | POA: Diagnosis present

## 2019-03-31 LAB — CREATININE, SERUM
Creatinine, Ser: 0.52 mg/dL (ref 0.44–1.00)
GFR calc Af Amer: >60 mL/min (ref >60)
GFR calc non Af Amer: >60 mL/min (ref >60)

## 2019-03-31 LAB — CBC
HCT: 36 % (ref 36.0–46.0)
Hemoglobin: 11.7 g/dL — ABNORMAL LOW (ref 12.0–15.0)
MCH: 27.9 pg (ref 26.0–34.0)
MCHC: 32.5 g/dL (ref 30.0–36.0)
MCV: 85.9 fL (ref 80.0–100.0)
Platelets: 353 10*3/uL (ref 150–400)
RBC: 4.19 MIL/uL (ref 3.87–5.11)
RDW: 13 % (ref 11.5–15.5)
WBC: 7.1 10*3/uL (ref 4.0–10.5)
nRBC: 0 % (ref 0.0–0.2)

## 2019-03-31 MED ORDER — PANTOPRAZOLE SODIUM 40 MG PO TBEC
40.0000 mg | DELAYED_RELEASE_TABLET | Freq: Every day | ORAL | Status: DC
  Administered 2019-03-31 – 2019-04-04 (×5): 40 mg via ORAL
  Filled 2019-03-31 (×5): qty 1

## 2019-03-31 MED ORDER — VANCOMYCIN HCL 1.5 G IV SOLR
1500.0000 mg | INTRAVENOUS | Status: DC
  Administered 2019-03-31: 20:00:00 1500 mg via INTRAVENOUS
  Filled 2019-03-31: qty 1500

## 2019-03-31 MED ORDER — SERTRALINE HCL 25 MG PO TABS
25.0000 mg | ORAL_TABLET | Freq: Every day | ORAL | Status: DC
  Administered 2019-03-31 – 2019-04-04 (×5): 25 mg via ORAL
  Filled 2019-03-31 (×5): qty 1

## 2019-03-31 MED ORDER — METOPROLOL SUCCINATE ER 25 MG PO TB24
50.0000 mg | ORAL_TABLET | Freq: Every day | ORAL | Status: DC
  Administered 2019-03-31 – 2019-04-03 (×4): 50 mg via ORAL
  Filled 2019-03-31 (×5): qty 2

## 2019-03-31 MED ORDER — AMLODIPINE BESYLATE 10 MG PO TABS
10.0000 mg | ORAL_TABLET | Freq: Every day | ORAL | Status: DC
  Administered 2019-03-31 – 2019-04-03 (×4): 10 mg via ORAL
  Filled 2019-03-31 (×5): qty 1

## 2019-03-31 NOTE — Progress Notes (Signed)
Obstetric and Gynecology  Subjective  No concerns overnight.  Does feel like she had less burning at the site overnight since starting vancomycin  Objective  Vital signs in last 24 hours: Temp:  [98.2 F (36.8 C)-98.7 F (37.1 C)] 98.4 F (36.9 C) (10/22 0038) Pulse Rate:  [79-97] 79 (10/22 0038) Resp:  [12-20] 12 (10/22 0038) BP: (103-140)/(66-92) 103/66 (10/22 0038) SpO2:  [98 %] 98 % (10/21 1815) Weight:  [84.4 kg] 84.4 kg (10/21 1107)    No intake or output data in the 24 hours ending 03/31/19 0743  General:NAD Pulm: no increased work of breathing Abdomen:soft, non-distended, still with erythema around the LLQ port site, no induration, no fluctuance, edges appear less erythematous Extremities: no edema  Labs: Results for orders placed or performed during the hospital encounter of 03/30/19 (from the past 24 hour(s))  CBC WITH DIFFERENTIAL     Status: None   Collection Time: 03/30/19  7:22 PM  Result Value Ref Range   WBC 8.2 4.0 - 10.5 K/uL   RBC 4.43 3.87 - 5.11 MIL/uL   Hemoglobin 12.2 12.0 - 15.0 g/dL   HCT 38.7 36.0 - 46.0 %   MCV 87.4 80.0 - 100.0 fL   MCH 27.5 26.0 - 34.0 pg   MCHC 31.5 30.0 - 36.0 g/dL   RDW 12.8 11.5 - 15.5 %   Platelets 376 150 - 400 K/uL   nRBC 0.0 0.0 - 0.2 %   Neutrophils Relative % 59 %   Neutro Abs 4.8 1.7 - 7.7 K/uL   Lymphocytes Relative 32 %   Lymphs Abs 2.7 0.7 - 4.0 K/uL   Monocytes Relative 5 %   Monocytes Absolute 0.4 0.1 - 1.0 K/uL   Eosinophils Relative 3 %   Eosinophils Absolute 0.3 0.0 - 0.5 K/uL   Basophils Relative 1 %   Basophils Absolute 0.1 0.0 - 0.1 K/uL   Immature Granulocytes 0 %   Abs Immature Granulocytes 0.02 0.00 - 0.07 K/uL  Creatinine, serum     Status: None   Collection Time: 03/31/19  6:09 AM  Result Value Ref Range   Creatinine, Ser 0.52 0.44 - 1.00 mg/dL   GFR calc non Af Amer >60 >60 mL/min   GFR calc Af Amer >60 >60 mL/min    Cultures: Results for orders placed or performed during the  hospital encounter of 02/01/19  Group A Strep by PCR     Status: None   Collection Time: 02/01/19  1:03 AM   Specimen: Throat; Sterile Swab  Result Value Ref Range Status   Group A Strep by PCR NOT DETECTED NOT DETECTED Final    Comment: Performed at [facility], [address] [place], [place] [address]  Novel Coronavirus, NAA (Hosp order, Send-out to Ref Lab; TAT 18-24 hrs     Status: Abnormal   Collection Time: 02/01/19  4:18 AM   Specimen: Nasopharyngeal Swab; Respiratory  Result Value Ref Range Status   SARS-CoV-2, NAA DETECTED (A) NOT DETECTED Final    Comment: CRITICAL RESULT CALLED TO, READ BACK BY AND VERIFIED WITH: (NOTE)                  Client Requested Flag This test was developed and its performance characteristics determined by [employer]. This test has not been FDA cleared or approved. This test has been authorized by FDA under an Emergency Use Authorization (EUA). This test is only authorized for the duration of time the declaration that circumstances exist justifying  the authorization of the emergency use of in vitro diagnostic tests for detection of SARS-CoV-2 virus and/or diagnosis of COVID-19 infection under section 564(b)(1) of the Act, 21 U.S.C. [id number]), unless the authorization is terminated or revoked sooner. When diagnostic testing is negative, the possibility of a false negative result should be considered in the context of a patient's recent exposures and the presence of clinical signs and symptoms consistent with COVID-19. An individual without symptoms of COVID-19 and who is not sheddin g SARS-CoV-2 virus would expect to have a negative (not detected) result in this assay. Performed At: [facility] [address] [place], [place] [id number] Rush Farmer MD [id number]    [place]  Final    Comment: Odis Hollingshead 02/02/2019 1609 JGF Performed at [facility], [address] [place], [place] [address]     Imaging:  Assessment   33 y.o. [phone] s.p TLH, BS, cystoscopy 03/24/2019 admitted for left trocar site cellulitis  Plan    1) Cellulitis - reports feeling better, stable slightly less red today.   - continue vancomycin  2) FEN - regular diet, NS 53mL/hr carrier fluid  3) DVT ppx - ambulatory  4) Hx A-fib - continue amlodipine and metoprolol for rate control  5) Disposition - anticiapte 48-hr antibiotic course with transition to po.  GIven not much response to initial course of bactrim will consider clindamycin

## 2019-04-01 ENCOUNTER — Encounter: Payer: Self-pay | Admitting: Student

## 2019-04-01 MED ORDER — VANCOMYCIN HCL IN DEXTROSE 750-5 MG/150ML-% IV SOLN
750.0000 mg | Freq: Two times a day (BID) | INTRAVENOUS | Status: DC
  Administered 2019-04-01 – 2019-04-04 (×6): 750 mg via INTRAVENOUS
  Filled 2019-04-01 (×7): qty 150

## 2019-04-01 MED ORDER — SODIUM CHLORIDE 0.9% FLUSH: 10.0000 mL | INTRAVENOUS | Status: DC | PRN

## 2019-04-01 MED ORDER — PIPERACILLIN-TAZOBACTAM 3.375 G IVPB
3.3750 g | Freq: Three times a day (TID) | INTRAVENOUS | Status: DC
  Administered 2019-04-01 – 2019-04-04 (×8): 3.375 g via INTRAVENOUS
  Filled 2019-04-01 (×10): qty 50

## 2019-04-01 NOTE — Consult Note (Signed)
Pharmacy Antibiotic Note  Debra Whitney is a 33 y.o. female admitted on 03/30/2019 with cellulitis. Per chart review, patient was on bactrim d/t erythema at port site. However, erythema spread. 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 Pharmacy has been consulted for Vancomycin dosing.  Plan: Current Vancomycin:  Loading dose: 2g x1 dose on 10/21  Maintenance dose: 1500 mg Q24H  Current dosing with expected trough <10. Renal function stable.  Will change dosing to Vancomycin 750 mg IV Q 12 hrs. Goal AUC 400-550. Expected AUC: 530 SCr used: 0.8 Expected Cmin: 15.4  Will order SCr for AM to continue to monitor renal function.       Temp (24hrs), Avg:98.3 F (36.8 C), Min:97.9 F (36.6 C), Max:98.7 F (37.1 C)  Recent Labs  Lab 03/30/19 1922 03/31/19 0609 03/31/19 1645  WBC 8.2  --  7.1  CREATININE  --  0.52  --     Estimated Creatinine Clearance: 98.5 mL/min (by C-G formula based on SCr of 0.52 mg/dL).    No Known Allergies  Antimicrobials this admission: 10/21 Vancomycin >>   No cultures available at this time.  Thank you for allowing pharmacy to be a part of this patient's care.  Rocky Morel 04/01/2019 7:40 AM

## 2019-04-01 NOTE — Progress Notes (Signed)
Obstetric and Gynecology  Subjective  Doing well  Objective  Vital signs in last 24 hours: Temp:  [97.8 F (36.6 C)-98.8 F (37.1 C)] 98.3 F (36.8 C) (10/23 1551) Pulse Rate:  [79-99] 99 (10/23 1551) Resp:  [16-20] 18 (10/23 1551) BP: (102-151)/(56-120) 115/98 (10/23 1551) SpO2:  [98 %-100 %] 99 % (10/23 1551)     Intake/Output Summary (Last 24 hours) at 04/01/2019 1658 Last data filed at 04/01/2019 1638 Gross per 24 hour  Intake 1634.04 ml  Output -  Net 1634.04 ml    General:NAD Abdomen: soft, non-tender, non-distended, the port sites are overall stable with no appreciable improvement or worsening in erythema, no development of induration or fluctuance to suggest drainable fluid collection Extremities:no edema, SCD's in place  Labs: No results found for this or any previous visit (from the past 24 hour(s)).  Cultures: Results for orders placed or performed during the hospital encounter of 02/01/19  Group A Strep by PCR     Status: None   Collection Time: 02/01/19  1:03 AM   Specimen: Throat; Sterile Swab  Result Value Ref Range Status   Group A Strep by PCR NOT DETECTED NOT DETECTED Final    Comment: Performed at [facility], [address] [place], [place] [address]  Novel Coronavirus, NAA (Hosp order, Send-out to Ref Lab; TAT 18-24 hrs     Status: Abnormal   Collection Time: 02/01/19  4:18 AM   Specimen: Nasopharyngeal Swab; Respiratory  Result Value Ref Range Status   SARS-CoV-2, NAA DETECTED (A) NOT DETECTED Final    Comment: CRITICAL RESULT CALLED TO, READ BACK BY AND VERIFIED WITH: (NOTE)                  Client Requested Flag This test was developed and its performance characteristics determined by [employer]. This test has not been FDA cleared or approved. This test has been authorized by FDA under an Emergency Use Authorization (EUA). This test is only authorized for the duration of time the declaration that circumstances  exist justifying the authorization of the emergency use of in vitro diagnostic tests for detection of SARS-CoV-2 virus and/or diagnosis of COVID-19 infection under section 564(b)(1) of the Act, 21 U.S.C. [id number]), unless the authorization is terminated or revoked sooner. When diagnostic testing is negative, the possibility of a false negative result should be considered in the context of a patient's recent exposures and the presence of clinical signs and symptoms consistent with COVID-19. An individual without symptoms of COVID-19 and who is not sheddin g SARS-CoV-2 virus would expect to have a negative (not detected) result in this assay. Performed At: [facility] [address] [place], [place] [id number] Rush Farmer MD [id number]    [place]  Final    Comment: Odis Hollingshead 02/02/2019 [place] JGF Performed at [facility], [address] [place], [place] [address]     Imaging:  Assessment   33 y.o. [id number] [facility] wound cellulitis following laparoscopic hysterectomy  Plan    1) Cellulitis - picture uploaded in the chart.  Given no significant improvement after 48-hrs of vancomycin discussed broadening coverage with  Tsosie Billing, MD from ID.  Clindamycin would be one means of broadening coverage, but I also discussed the clean contaminated nature of hysterectomy and possible vaginal flora being implicated.  Will add Zosyn for now in addition to vancomycin.  If failure to improve after addition of Zosyn imaging of abdominal wall to look for possible fluid collections  or abscess.

## 2019-04-01 NOTE — Progress Notes (Signed)
Obstetric and Gynecology  Subjective  No concerns, has noted some more itching around incision sites.  Objective  Vital signs in last 24 hours: Temp:  [97.8 F (36.6 C)-98.7 F (37.1 C)] 97.8 F (36.6 C) (10/23 0807) Pulse Rate:  [79-95] 92 (10/23 0807) Resp:  [16-20] 20 (10/23 0807) BP: (102-151)/(56-87) 137/84 (10/23 0807) SpO2:  [98 %-100 %] 98 % (10/23 0807)     Intake/Output Summary (Last 24 hours) at 04/01/2019 0820 Last data filed at 03/31/2019 1815 Gross per 24 hour  Intake 2107.04 ml  Output -  Net 2107.04 ml    General: NAD Pulmonary: no increased work of breathing Abdomen: soft, non-distended, trocar's D/C/I.  The left lower quadrant trocar erythema remains within the marked borders, slightly less red on the edges, and an area of central clearing is appreciated.  The right lower quadrant trocar is likewise stable within the marked borders with central clearing Extremities: no edema, erythema  Labs: Results for orders placed or performed during the hospital encounter of 03/30/19 (from the past 24 hour(s))  CBC     Status: Abnormal   Collection Time: 03/31/19  4:45 PM  Result Value Ref Range   WBC 7.1 4.0 - 10.5 K/uL   RBC 4.19 3.87 - 5.11 MIL/uL   Hemoglobin 11.7 (L) 12.0 - 15.0 g/dL   HCT 36.0 36.0 - 46.0 %   MCV 85.9 80.0 - 100.0 fL   MCH 27.9 26.0 - 34.0 pg   MCHC 32.5 30.0 - 36.0 g/dL   RDW 13.0 11.5 - 15.5 %   Platelets 353 150 - 400 K/uL   nRBC 0.0 0.0 - 0.2 %    Cultures: Results for orders placed or performed during the hospital encounter of 02/01/19  Group A Strep by PCR     Status: None   Collection Time: 02/01/19  1:03 AM   Specimen: Throat; Sterile Swab  Result Value Ref Range Status   Group A Strep by PCR NOT DETECTED NOT DETECTED Final    Comment: Performed at [facility], [address] [place], [place] [address]  Novel Coronavirus, NAA (Hosp order, Send-out to Ref Lab; TAT 18-24 hrs     Status: Abnormal   Collection Time:  02/01/19  4:18 AM   Specimen: Nasopharyngeal Swab; Respiratory  Result Value Ref Range Status   SARS-CoV-2, NAA DETECTED (A) NOT DETECTED Final    Comment: CRITICAL RESULT CALLED TO, READ BACK BY AND VERIFIED WITH: (NOTE)                  Client Requested Flag This test was developed and its performance characteristics determined by [employer]. This test has not been FDA cleared or approved. This test has been authorized by FDA under an Emergency Use Authorization (EUA). This test is only authorized for the duration of time the declaration that circumstances exist justifying the authorization of the emergency use of in vitro diagnostic tests for detection of SARS-CoV-2 virus and/or diagnosis of COVID-19 infection under section 564(b)(1) of the Act, 21 U.S.C. KA:123727), unless the authorization is terminated or revoked sooner. When diagnostic testing is negative, the possibility of a false negative result should be considered in the context of a patient's recent exposures and the presence of clinical signs and symptoms consistent with COVID-19. An individual without symptoms of COVID-19 and who is not sheddin g SARS-CoV-2 virus would expect to have a negative (not detected) result in this assay. Performed At: [facility] [place],  [place] [id number] Rush Farmer MD [id number]    Coronavirus Source NASOPHARYNGEAL  Final    Comment: Odis Hollingshead 02/02/2019 1609 JGF Performed at [facility], [address], [place], [place] [address]     Imaging:  Assessment   33 y.o. [phone] s/p TLH, BS, cysto 1 week ago with postoperative wound cellulitis  Plan   1) Wound cellulitis - continue vancomycin  2) FEN - NS at 16mL/hr, regular diet  3) DVT ppx - SCD's while in bed  4) Disposition - pending further clinical improvement

## 2019-04-02 ENCOUNTER — Encounter: Payer: Self-pay | Admitting: Radiology

## 2019-04-02 ENCOUNTER — Inpatient Hospital Stay: Payer: BC Managed Care – PPO

## 2019-04-02 ENCOUNTER — Other Ambulatory Visit: Payer: Self-pay

## 2019-04-02 LAB — CBC
HCT: 35.5 % — ABNORMAL LOW (ref 36.0–46.0)
Hemoglobin: 11.3 g/dL — ABNORMAL LOW (ref 12.0–15.0)
MCH: 27.7 pg (ref 26.0–34.0)
MCHC: 31.8 g/dL (ref 30.0–36.0)
MCV: 87 fL (ref 80.0–100.0)
Platelets: 358 10*3/uL (ref 150–400)
RBC: 4.08 MIL/uL (ref 3.87–5.11)
RDW: 13.1 % (ref 11.5–15.5)
WBC: 7.6 10*3/uL (ref 4.0–10.5)
nRBC: 0 % (ref 0.0–0.2)

## 2019-04-02 LAB — BASIC METABOLIC PANEL
Anion gap: 13 (ref 5–15)
BUN: 5 mg/dL — ABNORMAL LOW (ref 6–20)
CO2: 22 mmol/L (ref 22–32)
Calcium: 8.7 mg/dL — ABNORMAL LOW (ref 8.9–10.3)
Chloride: 105 mmol/L (ref 98–111)
Creatinine, Ser: 0.5 mg/dL (ref 0.44–1.00)
GFR calc Af Amer: >60 mL/min (ref >60)
GFR calc non Af Amer: >60 mL/min (ref >60)
Glucose, Bld: 108 mg/dL — ABNORMAL HIGH (ref 70–99)
Potassium: 3.6 mmol/L (ref 3.5–5.1)
Sodium: 140 mmol/L (ref 135–145)

## 2019-04-02 MED ORDER — IOHEXOL 240 MG/ML SOLN
25.0000 mL | INTRAMUSCULAR | Status: AC
  Administered 2019-04-02 (×2): 25 mL via ORAL

## 2019-04-02 MED ORDER — IOHEXOL 300 MG/ML  SOLN
100.0000 mL | Freq: Once | INTRAMUSCULAR | Status: AC | PRN
  Administered 2019-04-02: 100 mL via INTRAVENOUS

## 2019-04-02 NOTE — Progress Notes (Signed)
Pt's PO meds held for procedure, pt NPO except for oral contrast, will resume normal medication schedule when she returns from CT. Held IV abx due to IV contrast, will resume and reschedule when she returns.

## 2019-04-02 NOTE — H&P (Signed)
Obstetric and Gynecology  Subjective  Still doing well, no fevers, no chills, some warmth and tenderness around port site are the only symptoms reported.  Tolerating po without nausea or emesis.  Objective  Vital signs in last 24 hours: Temp:  [97.7 F (36.5 C)-98.8 F (37.1 C)] 97.9 F (36.6 C) (10/24 0804) Pulse Rate:  [84-100] 90 (10/24 0804) Resp:  [14-18] 18 (10/24 0804) BP: (104-138)/(61-120) 117/78 (10/24 0804) SpO2:  [98 %-100 %] 99 % (10/24 0804)   General: NAD Pulmonary:  No increased work of breathing Abdomen: Soft, non-distended, non-tender.  The right lower quadrant port site looks to be significantly less erythematous, the umbilical port site also looks D/C/I.  The right lower quadrant port site looks unchanged in dimensions, perhaps slightly less erythematous.  There is a small amount of serous discharge that was noted this morning when pressing at the trocar site.  The trocar site was slightly unroofed.  The underlying tissue looked health, was not malodorous, and there was not puss or further discharge noted.  A wound culture was obtained Extremities: no edema, SCD's  Labs: Results for orders placed or performed during the hospital encounter of 03/30/19 (from the past 24 hour(s))  CBC     Status: Abnormal   Collection Time: 04/02/19  6:24 AM  Result Value Ref Range   WBC 7.6 4.0 - 10.5 K/uL   RBC 4.08 3.87 - 5.11 MIL/uL   Hemoglobin 11.3 (L) 12.0 - 15.0 g/dL   HCT 35.5 (L) 36.0 - 46.0 %   MCV 87.0 80.0 - 100.0 fL   MCH 27.7 26.0 - 34.0 pg   MCHC 31.8 30.0 - 36.0 g/dL   RDW 13.1 11.5 - 15.5 %   Platelets 358 150 - 400 K/uL   nRBC 0.0 0.0 - 0.2 %  Basic metabolic panel     Status: Abnormal   Collection Time: 04/02/19  6:24 AM  Result Value Ref Range   Sodium 140 135 - 145 mmol/L   Potassium 3.6 3.5 - 5.1 mmol/L   Chloride 105 98 - 111 mmol/L   CO2 22 22 - 32 mmol/L   Glucose, Bld 108 (H) 70 - 99 mg/dL   BUN 5 (L) 6 - 20 mg/dL   Creatinine, Ser 0.50 0.44 -  1.00 mg/dL   Calcium 8.7 (L) 8.9 - 10.3 mg/dL   GFR calc non Af Amer >60 >60 mL/min   GFR calc Af Amer >60 >60 mL/min   Anion gap 13 5 - 15    Cultures: Results for orders placed or performed during the hospital encounter of 02/01/19  Group A Strep by PCR     Status: None   Collection Time: 02/01/19  1:03 AM   Specimen: Throat; Sterile Swab  Result Value Ref Range Status   Group A Strep by PCR NOT DETECTED NOT DETECTED Final    Comment: Performed at [facility], [address] [place], [place] [address]  Novel Coronavirus, NAA (Hosp order, Send-out to Ref Lab; TAT 18-24 hrs     Status: Abnormal   Collection Time: 02/01/19  4:18 AM   Specimen: Nasopharyngeal Swab; Respiratory  Result Value Ref Range Status   SARS-CoV-2, NAA DETECTED (A) NOT DETECTED Final    Comment: CRITICAL RESULT CALLED TO, READ BACK BY AND VERIFIED WITH: (NOTE)                  Client Requested Flag This test was developed and its performance characteristics determined by [facility] [employer] [facility]. This test has not been FDA cleared or approved. This test has been authorized by FDA under an Emergency Use Authorization (EUA). This test is only authorized for the duration of time the declaration that circumstances exist justifying the authorization of the emergency use of in vitro diagnostic tests for detection of SARS-CoV-2 virus and/or diagnosis of COVID-19 infection under section 564(b)(1) of the Act, 21 U.S.C. KA:123727), unless the authorization is terminated or revoked sooner. When diagnostic testing is negative, the possibility of a false negative result should be considered in the context of a patient's recent exposures and the presence of clinical signs and symptoms consistent with COVID-19. An individual without symptoms of COVID-19 and who is not sheddin g SARS-CoV-2 virus would expect to have a negative (not detected) result in this assay. Performed At: [facility] [address] [place], [place] [id number] Rush Farmer MD [id number]    [place]  Final    Comment: Odis Hollingshead 02/02/2019 1609 JGF Performed at [facility], [address] [place], [place] [address]     Imaging:  Assessment   33 y.o. [phone] HD4 postoperative wound cellulitis following hysterectomy 1 week ago  Plan   1) Wound cellulitis - the right lower quadrant and umbilicus look almost completely cleared after the addition of zosyn yesterday.  I did appreciated some clear serous drainage from the site today and we partially unroofed the trocar site to obtain a culture. - CT abdomen and pelvis to evaluate for fluid collections - Continue Vanc D3, Zosyn D2  2) FEN - general diet, NS 89mL/hr

## 2019-04-03 LAB — CREATININE, SERUM
Creatinine, Ser: 0.48 mg/dL (ref 0.44–1.00)
GFR calc Af Amer: >60 mL/min (ref >60)
GFR calc non Af Amer: >60 mL/min (ref >60)

## 2019-04-03 MED ORDER — ALUM & MAG HYDROXIDE-SIMETH 200-200-20 MG/5ML PO SUSP
15.0000 mL | Freq: Four times a day (QID) | ORAL | Status: DC | PRN
  Administered 2019-04-03: 15 mL via ORAL

## 2019-04-03 NOTE — Progress Notes (Signed)
Obstetric and Gynecology  Subjective  Doing well, some GERD symptoms.  No fevers, no chills. Minimal pain   Objective  Vital signs in last 24 hours: Temp:  [97.9 F (36.6 C)-98.5 F (36.9 C)] 98.5 F (36.9 C) (10/25 0757) Pulse Rate:  [78-99] 88 (10/25 0757) Resp:  [14-20] 20 (10/25 0757) BP: (102-138)/(66-100) 104/66 (10/25 0757) SpO2:  [99 %-100 %] 99 % (10/25 0757) Weight:  [83 kg] 83 kg (10/24 2015) Last BM Date: 04/02/19   Intake/Output Summary (Last 24 hours) at 04/03/2019 0929 Last data filed at 04/03/2019 0600 Gross per 24 hour  Intake 2113.12 ml  Output -  Net 2113.12 ml    General: NAD1 Pulmonary: no increased work of breathing Abdomen: soft, non-tender, non-distended, the right and umbilical troca sites are D/C/I.  The left lower quadrant site has a steri strip across it where it was opened for culture, no drainage, with significantly less erythema and starting edges of erythema starting to recede this morning Extremities: no edema  Labs: Results for orders placed or performed during the hospital encounter of 03/30/19 (from the past 24 hour(s))  Creatinine, serum     Status: None   Collection Time: 04/03/19  6:28 AM  Result Value Ref Range   Creatinine, Ser 0.48 0.44 - 1.00 mg/dL   GFR calc non Af Amer >60 >60 mL/min   GFR calc Af Amer >60 >60 mL/min    Cultures: Results for orders placed or performed during the hospital encounter of 03/30/19  Aerobic/Anaerobic Culture (surgical/deep wound)     Status: None (Preliminary result)   Collection Time: 04/02/19  8:37 AM   Specimen: Incision; Wound  Result Value Ref Range Status   Specimen Description   Final    INCISION Performed at [facility], [address], [place], [place] [address]    Special Requests   Final    Normal Performed at [facility], [address] [place], [place] [address]    Gram Stain   Final    RARE WBC PRESENT, PREDOMINANTLY PMN NO ORGANISMS SEEN Performed  at [facility], [address] [place], [place] [address]    Culture PENDING  Incomplete   Report Status PENDING  Incomplete    Imaging:  Assessment   33 y.o. [id number] wound cellulitis following hysterectomy   Plan    1) Cellulitis  - continue Vancomycin D4 and Zosyn D3 - clinically improving - CT negative for drainable fluid collections - Culture pending with PMN's seen but no organisms to guide antibiotic therapy, will continue to follow - Given response to Zosyn would consider Augmentin for outpatient therapy, no concern for pseudomonas which we would be loosing coverage for  2) FEN - general diet, NS 80mL/hr  3) DVT ppx - ambulatory, SCD while in bed  4) Disposition anticipate discharge tomorrow or tuesday

## 2019-04-04 LAB — CREATININE, SERUM
Creatinine, Ser: 0.63 mg/dL (ref 0.44–1.00)
GFR calc Af Amer: >60 mL/min (ref >60)
GFR calc non Af Amer: >60 mL/min (ref >60)

## 2019-04-04 MED ORDER — AMOXICILLIN-POT CLAVULANATE 875-125 MG PO TABS: 1.0000 | ORAL_TABLET | Freq: Two times a day (BID) | ORAL | 0 refills | Status: AC

## 2019-04-04 NOTE — Discharge Summary (Signed)
Physician Discharge Summary  Patient ID: Debra Whitney MRN: [id number] DOB/AGE: 1985/07/02 33 y.o.  Admit date: 03/30/2019 Discharge date: 04/04/2019  Admission Diagnoses: Postoperative wound cellulitis  Discharge Diagnoses:  Active Problems:   Postoperative wound cellulitis   Discharged Condition: good  Hospital Course: Patient admitted on 03/30/2019 for a postoperative wound cellulitis failing outpatient therapy with po bactrim DS.  She was admitted and started on Vancomycin. After failing to show significant improvement after 48-hrs of vancomycin antibiotic coverage was broadened with addition of Zosyn on 04/01/2019.  There was decreased erythema on 04/02/2019 but CT abdomen and pelvis was obtained at on 04/02/2019 to evaluate for an deeper seated fluid collections that might be amenable to drainage.   Imaging was negative for drain able fluid collection.  The 69mm LLQ port site was unroofed and cultured on 04/02/2019 as well.  Gram stain was negative, and culture showed no growth to date.  The cellulitis continued to markedly improve after the first 24-hrs of Zosyn.  The patient will be discharged on po Augmentin with follow up in clinic on 04/08/2019.   The patient remained afebrile throughout admission, was otherwise meeting all postoperative goals.  WBC normal throughout admission.    Consults: None  Significant Diagnostic Studies:  Results for orders placed or performed during the hospital encounter of 03/30/19 (from the past 72 hour(s))  CBC     Status: Abnormal   Collection Time: 04/02/19  6:24 AM  Result Value Ref Range   WBC 7.6 4.0 - 10.5 K/uL   RBC 4.08 3.87 - 5.11 MIL/uL   Hemoglobin 11.3 (L) 12.0 - 15.0 g/dL   HCT 35.5 (L) 36.0 - 46.0 %   MCV 87.0 80.0 - 100.0 fL   MCH 27.7 26.0 - 34.0 pg   MCHC 31.8 30.0 - 36.0 g/dL   RDW 13.1 11.5 - 15.5 %   Platelets 358 150 - 400 K/uL   nRBC 0.0 0.0 - 0.2 %    Comment: Performed at [facility], [address] [place], [place] [id number]  Basic metabolic panel     Status: Abnormal   Collection Time: 04/02/19  6:24 AM  Result Value Ref Range   Sodium 140 135 - 145 mmol/L   Potassium 3.6 3.5 - 5.1 mmol/L   Chloride 105 98 - 111 mmol/L   CO2 22 22 - 32 mmol/L   Glucose, Bld 108 (H) 70 - 99 mg/dL   BUN 5 (L) 6 - 20 mg/dL   Creatinine, Ser 0.50 0.44 - 1.00 mg/dL   Calcium 8.7 (L) 8.9 - 10.3 mg/dL   GFR calc non Af Amer >60 >60 mL/min   GFR calc Af Amer >60 >60 mL/min   Anion gap 13 5 - 15    Comment: Performed at [facility], [address] [place], [place] [address]  Aerobic/Anaerobic Culture (surgical/deep wound)     Status: None (Preliminary result)   Collection Time: 04/02/19  8:37 AM   Specimen: Incision; Wound  Result Value Ref Range   Specimen Description      INCISION Performed at [facility], [address] [place], [place] [address]    Special Requests      Normal Performed at [facility], [place], [place] [address]    Gram Stain      RARE WBC PRESENT, PREDOMINANTLY PMN NO ORGANISMS SEEN    Culture      NO GROWTH < 24 HOURS Performed at [facility], [address], [place], [place] [address]    Report Status PENDING   Creatinine, serum     Status: None   Collection Time: 04/03/19  6:28 AM  Result Value Ref Range   Creatinine, Ser 0.48 0.44 - 1.00 mg/dL   GFR calc non Af Amer >60 >60 mL/min   GFR calc Af Amer >60 >60 mL/min    Comment: Performed at [facility], [address] [place], [place] [address]  Creatinine, serum     Status: None   Collection Time: 04/04/19  5:59 AM  Result Value Ref Range   Creatinine, Ser 0.63 0.44 - 1.00 mg/dL   GFR calc non Af Amer >60 >60 mL/min   GFR calc Af Amer >60 >60 mL/min    Comment: Performed at [facility], [address] [place], [place] [address]   Results for orders placed or performed during the hospital encounter of 03/30/19  Aerobic/Anaerobic  Culture (surgical/deep wound)     Status: None (Preliminary result)   Collection Time: 04/02/19  8:37 AM   Specimen: Incision; Wound  Result Value Ref Range Status   Specimen Description   Final    INCISION Performed at [facility], [address] [place], [place] [address]    Special Requests   Final    Normal Performed at [facility], [address] [place], [place] [address]    Gram Stain   Final    RARE WBC PRESENT, PREDOMINANTLY PMN NO ORGANISMS SEEN    Culture   Final    NO GROWTH < 24 HOURS Performed at [facility] [address], [place], [place] [address]    Report Status PENDING  Incomplete   Ct Abdomen Pelvis W Contrast  Result Date: 04/02/2019 CLINICAL DATA:  Left lower quadrant cellulitis. Status post hysterectomy 1 week ago. Unresponsive to antibiotics. EXAM: CT ABDOMEN AND PELVIS WITH CONTRAST TECHNIQUE: Multidetector CT imaging of the abdomen and pelvis was performed using the standard protocol following bolus administration of intravenous contrast. CONTRAST:  174mL OMNIPAQUE IOHEXOL 300 MG/ML  SOLN COMPARISON:  None. FINDINGS: Lower chest: No acute abnormality. Hepatobiliary: No focal liver abnormality is seen. Diffuse low attenuation of the liver as can be seen with hepatic steatosis. No gallstones, gallbladder wall thickening, or biliary dilatation. Pancreas: Unremarkable. No pancreatic ductal dilatation or surrounding inflammatory changes. Spleen: Normal in size without focal abnormality. Adrenals/Urinary Tract: Normal adrenal glands. 16 mm hypodense, fluid attenuating left renal mass consistent with a cyst. No urolithiasis or obstructive uropathy. No bladder abnormality. Stomach/Bowel: Stomach is within normal limits. Appendix appears normal. No evidence of bowel wall thickening, distention, or inflammatory changes. Vascular/Lymphatic: No significant vascular findings are present. No enlarged abdominal or pelvic lymph nodes. Reproductive:  Recent hysterectomy. Other: No ascites. Mild edema in the subcutaneous fat of the left lower anterior abdominal wall with skin thickening concerning for cellulitis. No drainable fluid collection or hematoma. Musculoskeletal: No acute osseous abnormality. No aggressive osseous lesion. IMPRESSION: 1. Mild edema in the subcutaneous fat of the left lower anterior abdominal wall with skin thickening concerning for cellulitis. No drainable fluid collection or hematoma. 2. Interval hysterectomy. No focal fluid collection or pelvic free fluid. 3. Hepatic steatosis. Electronically Signed   By: Kathreen Devoid   On: 04/02/2019 12:09   Treatments: IV antibiotics  Discharge Exam: Blood pressure 98/80, pulse 88, temperature 98 F (36.7 C), temperature source Oral, resp. rate 20, height 5\' 1"  (1.549 m), weight 83 kg,  SpO2 99 %. General appearance: alert, appears stated age and no distress Resp: no increased work of breathing GI: The left lower quadrant trocar site erythema is still faintly visible but significantly improved.  No inudration, no calor, no dolor.    Disposition: Discharge disposition: 01-Home or Self Care       Discharge Instructions    Call MD for:   Complete by: As directed    Heavy vaginal bleeding greater than 1 pad an hour   Call MD for:  difficulty breathing, headache or visual disturbances   Complete by: As directed    Call MD for:  extreme fatigue   Complete by: As directed    Call MD for:  hives   Complete by: As directed    Call MD for:  persistant dizziness or light-headedness   Complete by: As directed    Call MD for:  persistant nausea and vomiting   Complete by: As directed    Call MD for:  redness, tenderness, or signs of infection (pain, swelling, redness, odor or green/yellow discharge around incision site)   Complete by: As directed    Call MD for:  severe uncontrolled pain   Complete by: As directed    Call MD for:  temperature >100.4   Complete by: As directed     Diet general   Complete by: As directed    Discharge wound care:   Complete by: As directed    You may apply a light dressing for minor discharge from the incision or to keep waistbands of clothing from rubbing.  You may also have been discharge with a clear dressing in which case this will be removed at your postoperative clinic visit.  You may shower, use soap on your incision.  Avoid baths or soaking the incision in the first 6 weeks following your surgery..   Discontinue IV   Complete by: As directed    Driving restriction   Complete by: As directed    Avoid driving for at least 2 weeks or while taking prescription narcotics.   Lifting restrictions   Complete by: As directed    Weight restriction of 10lbs for 6 weeks.     Allergies as of 04/04/2019   No Known Allergies     Medication List    STOP taking these medications   sulfamethoxazole-trimethoprim 800-160 MG tablet Commonly known as: Bactrim DS     TAKE these medications   amLODipine 5 MG tablet Commonly known as: NORVASC Take 1 tablet (5 mg total) by mouth daily. What changed: how much to take   amoxicillin-clavulanate 875-125 MG tablet Commonly known as: Augmentin Take 1 tablet by mouth 2 (two) times daily for 10 days.   cetirizine 10 MG tablet Commonly known as: ZYRTEC Take 10 mg by mouth daily.   fluticasone 50 MCG/ACT nasal spray Commonly known as: FLONASE Place 2 sprays into both nostrils daily.   ibuprofen 600 MG tablet Commonly known as: ADVIL Take 1 tablet (600 mg total) by mouth every 6 (six) hours as needed.   metoprolol succinate 50 MG 24 hr tablet Commonly known as: TOPROL-XL Take 50 mg by mouth daily. Take with or immediately following a meal.   omeprazole 40 MG capsule Commonly known as: PRILOSEC Take 40 mg by mouth daily.   oxyCODONE-acetaminophen 5-325 MG tablet Commonly known as: PERCOCET/ROXICET Take 1 tablet by mouth every 4 (four) hours as needed for moderate pain.    sertraline 25 MG tablet Commonly known as: ZOLOFT Take  25 mg by mouth daily.            Discharge Care Instructions  (From admission, onward)         Start     Ordered   04/04/19 0000  Discharge wound care:    Comments: You may apply a light dressing for minor discharge from the incision or to keep waistbands of clothing from rubbing.  You may also have been discharge with a clear dressing in which case this will be removed at your postoperative clinic visit.  You may shower, use soap on your incision.  Avoid baths or soaking the incision in the first 6 weeks following your surgery..   04/04/19 [id number]         Follow-up Information    Malachy Mood, MD. Schedule an appointment as soon as possible for a visit on 04/08/2019.   Specialty: Obstetrics and Gynecology Why: wound check Contact information: [address] [place] [address] [phone]           Signed: Malachy Mood 04/04/2019, 8:42 AM

## 2019-04-04 NOTE — Progress Notes (Signed)
Pt discharged home.  Discharge instructions, prescriptions and follow up appointment given to and reviewed with pt.  Pt verbalized understanding.  Escorted by staff. 

## 2019-04-07 LAB — AEROBIC/ANAEROBIC CULTURE W GRAM STAIN (SURGICAL/DEEP WOUND)
Culture: NO GROWTH
Special Requests: NORMAL

## 2019-04-08 ENCOUNTER — Other Ambulatory Visit: Payer: Self-pay

## 2019-04-08 ENCOUNTER — Ambulatory Visit (INDEPENDENT_AMBULATORY_CARE_PROVIDER_SITE_OTHER): Payer: BC Managed Care – PPO | Admitting: Obstetrics and Gynecology

## 2019-04-08 ENCOUNTER — Encounter: Payer: Self-pay | Admitting: Obstetrics and Gynecology

## 2019-04-08 VITALS — BP 140/88 | HR 101 | Wt 185.0 lb

## 2019-04-08 DIAGNOSIS — Z4889 Encounter for other specified surgical aftercare: Secondary | ICD-10-CM

## 2019-04-08 NOTE — Progress Notes (Signed)
Postoperative Follow-up Patient presents post op from [place], BS, cysto 2weeks ago for abnormal uterine bleeding.  Postoperative course complicated by LLQ port site cellulitis requiring re-addmision and IV antibiotics.  Cultures no growth to date, remains on Augmentin  Subjective: Patient reports marked improvement in her preop symptoms. Eating a regular diet without difficulty. Pain is controlled without any medications.  Activity: normal activities of daily living.  Objective: Blood pressure 140/88, pulse (!) 101, weight 185 lb (83.9 kg), last menstrual period 02/07/2019.  General: NAD Pulmonary: no increased work of breathing Abdomen: soft, non-tender, non-distended, incision(s) D/C/I. Complete resolution of previously noted cellulitis Extremities: no edema Neurologic: normal gait    Admission on 03/24/2019, Discharged on 03/25/2019  Component Date Value Ref Range Status  . ABO/RH(D) 03/24/2019 A POS   Final  . Antibody Screen 03/24/2019 NEG   Final  . Sample Expiration 03/24/2019    Final                   Value:03/27/2019,2359 Performed at [facility], [address], [place], [place] [address]   . Preg Test, Ur 03/24/2019 NEGATIVE  NEGATIVE Final   Comment:        THE SENSITIVITY OF THIS METHODOLOGY IS >24 mIU/mL   . SURGICAL PATHOLOGY 03/24/2019    Final-Edited                   Value:SURGICAL PATHOLOGY CASE: [id number] PATIENT: Debra Whitney Surgical Pathology Report     Specimen Submitted: A. Uterus with cervix, bilateral tubes  Clinical History: Abnormal uterine bleeding      DIAGNOSIS: A. UTERUS AND CERVIX WITH BILATERAL FALLOPIAN TUBES; TOTAL HYSTERECTOMY WITH BILATERAL SALPINGECTOMY: - CERVIX: NEGATIVE FOR DYSPLASIA AND MALIGNANCY. - ENDOMETRIUM: SECRETORY.  NEGATIVE FOR ATYPIA / EIN AND MALIGNANCY. - MYOMETRIUM: UNREMARKABLE. - UTERINE SEROSA: ENDOMETRIOSIS. - ONE FALLOPIAN TUBE: ENDOMETRIOSIS. - OPPOSITE FALLOPIAN TUBE:  UNREMARKABLE.  GROSS DESCRIPTION: A. Labeled: Uterus with cervix and bilateral tubes Received: In formalin Weight: 143 grams Dimensions:      Fundus -5.5 x 6.5 x 5.0 cm      Cervix -3.0 x 3.0 cm Serosa: Pink, smooth, and shiny Cervix: Ectocervical mucosa is tan, smooth, and shiny Endocervix: Endocervical canal is 3.5 cm in length, covered by hemorrhagic mucoid material Endometrial cavity:      Dimen                         sions -3.5 cm in length and 2.5 cm in transverse      Thickness -ranging from 0.2-0.4 cm      Other findings -slightly hemorrhagic, soft Myometrium:     Thickness -2.5 cm     Other findings -slightly fibrotic changes Fallopian tubes: Bilateral detached fallopian tubes with fimbriated ends covered by smooth pink serosa.  Sectioning demonstrates a patent pinpoint lumen.  Block summary: 1 -anterior cervix 2-posterior cervix 3-4-anterior uterine wall with representative endometrium, myometrium, and serosa 5-6-posterior uterine wall with representative endometrium, myometrium, and serosa 7-representative sections of fallopian tube #1 with entirely submitted fimbriated end 8-representative sections of fallopian tube #2 with entirely submitted fimbriated end    Final Diagnosis performed by Betsy Pries, MD.   Electronically signed 03/25/2019 1:46:11PM The electronic signature indicates that the named Attending Pathologist has evaluated the specimen Technical comp                         onent  performed at [facility], [address], [place], [place] [address] Lab: [phone] Dir: Rush Farmer, MD, MMM  Professional component performed at [facility] [place], [place], [place] [address] Lab: [phone] Dir: Dellia Nims. Rubinas, MD   . WBC 03/25/2019 12.6* 4.0 - 10.5 K/uL Final  . RBC 03/25/2019 3.99  3.87 - 5.11 MIL/uL Final  . Hemoglobin 03/25/2019 11.0* 12.0 - 15.0 g/dL Final  . HCT 03/25/2019 35.3* 36.0 - 46.0 %  Final  . MCV 03/25/2019 88.5  80.0 - 100.0 fL Final  . MCH 03/25/2019 27.6  26.0 - 34.0 pg Final  . MCHC 03/25/2019 31.2  30.0 - 36.0 g/dL Final  . RDW 03/25/2019 13.1  11.5 - 15.5 % Final  . Platelets 03/25/2019 319  150 - 400 K/uL Final  . nRBC 03/25/2019 0.0  0.0 - 0.2 % Final   Performed at [facility], [address] [place], [place] [address]  . Sodium 03/25/2019 141  135 - 145 mmol/L Final  . Potassium 03/25/2019 3.8  3.5 - 5.1 mmol/L Final  . Chloride 03/25/2019 110  98 - 111 mmol/L Final  . CO2 03/25/2019 24  22 - 32 mmol/L Final  . Glucose, Bld 03/25/2019 123* 70 - 99 mg/dL Final  . BUN 03/25/2019 <5* 6 - 20 mg/dL Final  . Creatinine, Ser 03/25/2019 0.45  0.44 - 1.00 mg/dL Final  . Calcium 03/25/2019 8.3* 8.9 - 10.3 mg/dL Final  . GFR calc non Af Amer 03/25/2019 >60  >60 mL/min Final  . GFR calc Af Amer 03/25/2019 >60  >60 mL/min Final  . Anion gap 03/25/2019 7  5 - 15 Final   Performed at [facility], [address], [place], [place] [address]    Assessment: 33 y.o. s/p TLH, BS, cystoscopy with postoperative development of cellulitis stable  Plan: Patient has done well after surgery with no apparent complications.  I have discussed the post-operative course to date, and the expected progress moving forward.  The patient understands what complications to be concerned about.  I will see the patient in routine follow up, or sooner if needed.    Activity plan: No heavy lifting, no intercourse, no baths   Malachy Mood, MD, [place], [facility] 04/08/2019, 2:46 PM

## 2019-05-09 ENCOUNTER — Ambulatory Visit: Payer: BC Managed Care – PPO | Admitting: Obstetrics and Gynecology

## 2019-05-11 NOTE — Telephone Encounter (Signed)
Updated patients chart with telehealth consent

## 2019-05-12 ENCOUNTER — Encounter: Payer: Self-pay | Admitting: Cardiovascular Disease

## 2019-05-12 ENCOUNTER — Other Ambulatory Visit: Payer: Self-pay

## 2019-05-12 ENCOUNTER — Telehealth (INDEPENDENT_AMBULATORY_CARE_PROVIDER_SITE_OTHER): Payer: BC Managed Care – PPO | Admitting: Cardiovascular Disease

## 2019-05-12 VITALS — BP 122/96 | HR 86 | Ht 61.0 in | Wt 185.0 lb

## 2019-05-12 DIAGNOSIS — I480 Paroxysmal atrial fibrillation: Secondary | ICD-10-CM | POA: Diagnosis not present

## 2019-05-12 DIAGNOSIS — I10 Essential (primary) hypertension: Secondary | ICD-10-CM

## 2019-05-12 NOTE — Patient Instructions (Signed)
Medication Instructions:  Continue same medications *If you need a refill on your cardiac medications before your next appointment, please call your pharmacy*  Lab Work: None If you have labs (blood work) drawn today and your tests are completely normal, you will receive your results only by: Marland Kitchen MyChart Message (if you have MyChart) OR . A paper copy in the mail If you have any lab test that is abnormal or we need to change your treatment, we will call you to review the results.  Testing/Procedures: None  Follow-Up: At [facility], you and your health needs are our priority.  As part of our continuing mission to provide you with exceptional heart care, we have created designated Provider Care Teams.  These Care Teams include your primary Cardiologist (physician) and Advanced Practice Providers (APPs -  Physician Assistants and Nurse Practitioners) who all work together to provide you with the care you need, when you need it.  Your next appointment:   6 month(s)  The format for your next appointment:   Either In Person or Virtual  Provider:    You may see Kathlyn Sacramento, MD or one of the following Advanced Practice Providers on your designated Care Team:    Murray Hodgkins, NP  Christell Faith, PA-C  Marrianne Mood, PA-C

## 2019-05-12 NOTE — Progress Notes (Signed)
Virtual Visit via Telephone Note   This visit type was conducted due to national recommendations for restrictions regarding the COVID-19 Pandemic (e.g. social distancing) in an effort to limit this patient's exposure and mitigate transmission in our community.  Due to her co-morbid illnesses, this patient is at least at moderate risk for complications without adequate follow up.  This format is felt to be most appropriate for this patient at this time.  The patient did not have access to video technology/had technical difficulties with video requiring transitioning to audio format only (telephone).  All issues noted in this document were discussed and addressed.  No physical exam could be performed with this format.  Please refer to the patient's chart for her  consent to telehealth for [facility].   Date:  05/12/2019   ID:  BANI UM, DOB 1986-04-18, MRN [id number]  Patient Location: Home Provider Location: Office  PCP:  [facility]  Cardiologist:  Kathlyn Sacramento, MD  Electrophysiologist:  None   Evaluation Performed:  Follow-Up Visit  Chief Complaint: Feeling better with more blood pressure control.  History of Present Illness:    Debra Whitney is a 33 y.o. female was reached today for a follow-up visit regarding paroxysmal atrial fibrillation and essential hypertension. She had brief atrial fibrillation in 2018 in the setting of large alcoholic with vomiting and hypokalemia. She converted to sinus rhythm within 24 hours. Echocardiogram was normal.  She was anticoagulated for only 3 months and it was decided not to continue long-term anticoagulation given that it was an isolated episode.  Chads Vasc score of 2 given sex and hypertension. She was seen recently in our office for preoperative cardiovascular evaluation before hysterectomy.  She was considered at low risk but her blood pressure was elevated and thus amlodipine was added.  She had the surgery done  without issues but did develop postoperative infection.  She reports significant improvement in blood pressure with amlodipine with no side effects.  She denies chest pain, shortness of breath or palpitations.  The patient does not have symptoms concerning for COVID-19 infection (fever, chills, cough, or new shortness of breath).    Past Medical History:  Diagnosis Date  . Anxiety   . Atrial fibrillation ([place])    a. diagnosed on 10/20/2016; chads2vasc => at least 2 (HTN, sex category), possibly 3 given history of gestational diabetes  . Depression   . Dysrhythmia    afib and tachy rates  . Endometriosis   . GERD (gastroesophageal reflux disease)   . Gestational diabetes   . Hypertension   . Menorrhagia   . Migraine   . Obesity   . PONV (postoperative nausea and vomiting)    Past Surgical History:  Procedure Laterality Date  . CESAREAN SECTION  01/28/2018  . CYSTOSCOPY N/A 03/24/2019   Procedure: CYSTOSCOPY;  Surgeon: Malachy Mood, MD;  Location: [facility] ORS;  Service: Gynecology;  Laterality: N/A;  . DILATION AND CURETTAGE OF UTERUS    . endometrial surgery    . HERNIA REPAIR     umbilical  . HYSTEROSCOPY W/D&C  11/29/2015   Procedure: DILATATION AND CURETTAGE /HYSTEROSCOPY;  Surgeon: Malachy Mood, MD;  Location: [facility] ORS;  Service: Gynecology;;  . LAPAROSCOPY N/A 11/29/2015   Procedure: LAPAROSCOPY DIAGNOSTIC;  Surgeon: Malachy Mood, MD;  Location: [facility] ORS;  Service: Gynecology;  Laterality: N/A;  . NASAL SINUS SURGERY    . TOTAL LAPAROSCOPIC HYSTERECTOMY WITH SALPINGECTOMY N/A 03/24/2019   Procedure: TOTAL LAPAROSCOPIC HYSTERECTOMY WITH SALPINGECTOMY;  Surgeon: Malachy Mood, MD;  Location: [facility] ORS;  Service: Gynecology;  Laterality: N/A;  . TUBAL LIGATION  01/28/2018  . WISDOM TOOTH EXTRACTION       Current Meds  Medication Sig  . amLODipine (NORVASC) 10 MG tablet Take 10 mg by mouth daily.  . cetirizine (ZYRTEC) 10 MG tablet Take 10 mg by mouth daily.  .  fluticasone (FLONASE) 50 MCG/ACT nasal spray Place 2 sprays into both nostrils daily.  Marland Kitchen ibuprofen (ADVIL) 600 MG tablet Take 1 tablet (600 mg total) by mouth every 6 (six) hours as needed.  . metoprolol succinate (TOPROL-XL) 50 MG 24 hr tablet Take 50 mg by mouth daily. Take with or immediately following a meal.  . omeprazole (PRILOSEC) 40 MG capsule Take 40 mg by mouth daily.  Marland Kitchen oxyCODONE-acetaminophen (PERCOCET/ROXICET) 5-325 MG tablet Take 1 tablet by mouth every 4 (four) hours as needed for moderate pain.  Marland Kitchen sertraline (ZOLOFT) 25 MG tablet Take 25 mg by mouth daily.      Allergies:   Patient has no known allergies.   Social History   Tobacco Use  . Smoking status: Never Smoker  . Smokeless tobacco: Never Used  Substance Use Topics  . Alcohol use: Yes    Comment: occasionally  . Drug use: No     Family Hx: The patient's family history includes CVA in her maternal grandmother; Diabetes in her mother; Hypertension in her mother.  ROS:   Please see the history of present illness.     All other systems reviewed and are negative.   Prior CV studies:   The following studies were reviewed today:    Labs/Other Tests and Data Reviewed:    EKG:  No ECG reviewed.  Recent Labs: 02/18/2019: TSH 1.120 04/02/2019: BUN 5; Hemoglobin 11.3; Platelets 358; Potassium 3.6; Sodium 140 04/04/2019: Creatinine, Ser 0.63   Recent Lipid Panel Lab Results  Component Value Date/Time   CHOL 147 10/21/2016 01:37 AM   TRIG 178 (H) 10/21/2016 01:37 AM   HDL 41 10/21/2016 01:37 AM   CHOLHDL 3.6 10/21/2016 01:37 AM   LDLCALC 70 10/21/2016 01:37 AM    Wt Readings from Last 3 Encounters:  05/12/19 185 lb (83.9 kg)  04/08/19 185 lb (83.9 kg)  03/30/19 186 lb (84.4 kg)     Objective:    Vital Signs:  BP (!) 122/96 (BP Location: Left Arm, Patient Position: Sitting, Cuff Size: Normal)   Pulse 86   Ht 5\' 1"  (1.549 m)   Wt 185 lb (83.9 kg)   LMP 02/07/2019   BMI 34.96 kg/m    VITAL  SIGNS:  reviewed  ASSESSMENT & PLAN:    1.  Paroxysmal atrial fibrillation: 1 isolated episode in 2018.  Continue Toprol.  Will recommend long-term anticoagulation only if she has documented recurrent episodes.  2. Essential hypertension: Blood pressure improved with recent addition of amlodipine.  Continue Toprol.  COVID-19 Education: The signs and symptoms of COVID-19 were discussed with the patient and how to seek care for testing (follow up with PCP or arrange E-visit).  The importance of social distancing was discussed today.  Time:   Today, I have spent 8 minutes with the patient with telehealth technology discussing the above problems.     Medication Adjustments/Labs and Tests Ordered: Current medicines are reviewed at length with the patient today.  Concerns regarding medicines are outlined above.   Tests Ordered: No orders of the defined types were placed in this encounter.   Medication Changes: No  orders of the defined types were placed in this encounter.   Follow Up:  Either In Person or Virtual in 6 month(s)  Signed, Kathlyn Sacramento, MD  05/12/2019 9:24 AM    [place]

## 2019-05-23 ENCOUNTER — Telehealth: Payer: Self-pay

## 2019-05-23 NOTE — Telephone Encounter (Signed)
Patient had to r/s her orignal post op apt that was at beginning of this month d/t her son Covid+. She is scheduled for this Friday, but would like to be seen sooner as she is having issues feeling nauseas, really hot (not feeling well), she now has a yeast infection. She was hospitalized after her surgery for an infection and didn't know if there was another isuue coming up or if it was just the yeast infection making her feel bad. Cb#[phone].

## 2019-05-23 NOTE — Telephone Encounter (Signed)
LMVM TRC. Called to inquire about date of her sons positive Covid test and inquire if she has tried any OTC YI treatment, get some other details about her symptoms.

## 2019-05-24 NOTE — Telephone Encounter (Signed)
Spoke w/patient. She was exposed by her sister on 05/09/2019 (2 weeks ago), her daughter was positive and will not be out of quarentine until Thursday 05/26/2019. She did try OTC AZO yeast which hasn't helped. She states she wasn't sure if it was ok for her to use Monostat/vaginal cream since she hasn't had her f/u from her hysterectomy and also it usually takes Diflucan to clear up her yeast infections. Advised will send message to AMS for review as he is in office today.

## 2019-05-27 ENCOUNTER — Ambulatory Visit: Payer: BC Managed Care – PPO | Admitting: Obstetrics and Gynecology

## 2019-05-27 NOTE — Progress Notes (Deleted)
Postoperative Follow-up Patient presents post op from [place], BS, cystoscopy 6weeks ago for abnormal uterine bleeding.  Postoperative course was complicated for re-admission for left lower quadrant wound cellulitis requiring IV antibiotics.  Subjective: Patient reports marked improvement in her preop symptoms. Eating a regular diet without difficulty. The patient is not having any pain.  Activity: normal activities of daily living.  Objective: Vitals: ***  General: NAD Pulmonary: no increased work of breathing Abdomen: soft, non-tender, non-distended, incision(s) D/C/I GU: normal external female genitalia {Blank single:19197::"normal cervix, no CMT, uterus normal in shape and contour, no adnexal tenderness or masses","vaginal cuff intact, well healed"} Extremities: no edema Neurologic: normal gait    Admission on 03/24/2019, Discharged on 03/25/2019  Component Date Value Ref Range Status  . ABO/RH(D) 03/24/2019 A POS   Final  . Antibody Screen 03/24/2019 NEG   Final  . Sample Expiration 03/24/2019    Final                   Value:03/27/2019,2359 Performed at [facility], [address], [place], [place] [address]   . Preg Test, Ur 03/24/2019 NEGATIVE  NEGATIVE Final   Comment:        THE SENSITIVITY OF THIS METHODOLOGY IS >24 mIU/mL   . SURGICAL PATHOLOGY 03/24/2019    Final-Edited                   Value:SURGICAL PATHOLOGY CASE: [id number] PATIENT: LA-TISHIA Aldaz Surgical Pathology Report     Specimen Submitted: A. Uterus with cervix, bilateral tubes  Clinical History: Abnormal uterine bleeding      DIAGNOSIS: A. UTERUS AND CERVIX WITH BILATERAL FALLOPIAN TUBES; TOTAL HYSTERECTOMY WITH BILATERAL SALPINGECTOMY: - CERVIX: NEGATIVE FOR DYSPLASIA AND MALIGNANCY. - ENDOMETRIUM: SECRETORY.  NEGATIVE FOR ATYPIA / EIN AND MALIGNANCY. - MYOMETRIUM: UNREMARKABLE. - UTERINE SEROSA: ENDOMETRIOSIS. - ONE FALLOPIAN TUBE: ENDOMETRIOSIS. - OPPOSITE  FALLOPIAN TUBE: UNREMARKABLE.  GROSS DESCRIPTION: A. Labeled: Uterus with cervix and bilateral tubes Received: In formalin Weight: 143 grams Dimensions:      Fundus -5.5 x 6.5 x 5.0 cm      Cervix -3.0 x 3.0 cm Serosa: Pink, smooth, and shiny Cervix: Ectocervical mucosa is tan, smooth, and shiny Endocervix: Endocervical canal is 3.5 cm in length, covered by hemorrhagic mucoid material Endometrial cavity:      Dimen                         sions -3.5 cm in length and 2.5 cm in transverse      Thickness -ranging from 0.2-0.4 cm      Other findings -slightly hemorrhagic, soft Myometrium:     Thickness -2.5 cm     Other findings -slightly fibrotic changes Fallopian tubes: Bilateral detached fallopian tubes with fimbriated ends covered by smooth pink serosa.  Sectioning demonstrates a patent pinpoint lumen.  Block summary: 1 -anterior cervix 2-posterior cervix 3-4-anterior uterine wall with representative endometrium, myometrium, and serosa 5-6-posterior uterine wall with representative endometrium, myometrium, and serosa 7-representative sections of fallopian tube #1 with entirely submitted fimbriated end 8-representative sections of fallopian tube #2 with entirely submitted fimbriated end    Final Diagnosis performed by Betsy Pries, MD.   Electronically signed 03/25/2019 1:46:11PM The electronic signature indicates that the named Attending Pathologist has evaluated the specimen Technical comp                         onent  performed at [facility], [address], [place], [place] [address] Lab: [phone] Dir: Rush Farmer, MD, MMM  Professional component performed at [facility], [facility], [place], [place], [place] [address] Lab: [phone] Dir: Dellia Nims. Rubinas, MD   . WBC 03/25/2019 12.6* 4.0 - 10.5 K/uL Final  . RBC 03/25/2019 3.99  3.87 - 5.11 MIL/uL Final  . Hemoglobin 03/25/2019 11.0* 12.0 - 15.0 g/dL Final  . HCT 03/25/2019 35.3*  36.0 - 46.0 % Final  . MCV 03/25/2019 88.5  80.0 - 100.0 fL Final  . MCH 03/25/2019 27.6  26.0 - 34.0 pg Final  . MCHC 03/25/2019 31.2  30.0 - 36.0 g/dL Final  . RDW 03/25/2019 13.1  11.5 - 15.5 % Final  . Platelets 03/25/2019 319  150 - 400 K/uL Final  . nRBC 03/25/2019 0.0  0.0 - 0.2 % Final   Performed at [facility], [address], [place], [place] [address]  . Sodium 03/25/2019 141  135 - 145 mmol/L Final  . Potassium 03/25/2019 3.8  3.5 - 5.1 mmol/L Final  . Chloride 03/25/2019 110  98 - 111 mmol/L Final  . CO2 03/25/2019 24  22 - 32 mmol/L Final  . Glucose, Bld 03/25/2019 123* 70 - 99 mg/dL Final  . BUN 03/25/2019 <5* 6 - 20 mg/dL Final  . Creatinine, Ser 03/25/2019 0.45  0.44 - 1.00 mg/dL Final  . Calcium 03/25/2019 8.3* 8.9 - 10.3 mg/dL Final  . GFR calc non Af Amer 03/25/2019 >60  >60 mL/min Final  . GFR calc Af Amer 03/25/2019 >60  >60 mL/min Final  . Anion gap 03/25/2019 7  5 - 15 Final   Performed at [facility], [address] [place], [place] [address]    Assessment: 33 y.o. s/p *** {assessment details:3041134}  Plan: Patient has done well after surgery with no apparent complications.  I have discussed the post-operative course to date, and the expected progress moving forward.  The patient understands what complications to be concerned about.  I will see the patient in routine follow up, or sooner if needed.    Activity plan: {plan;activity instructions GI:110000}   Malachy Mood, MD, [facility], [facility] 05/27/2019, 8:48 AM

## 2019-06-06 ENCOUNTER — Encounter: Payer: Self-pay | Admitting: Obstetrics and Gynecology

## 2019-06-06 ENCOUNTER — Ambulatory Visit (INDEPENDENT_AMBULATORY_CARE_PROVIDER_SITE_OTHER): Payer: BC Managed Care – PPO | Admitting: Obstetrics and Gynecology

## 2019-06-06 ENCOUNTER — Other Ambulatory Visit: Payer: Self-pay

## 2019-06-06 VITALS — BP 136/102 | Ht 61.0 in | Wt 191.0 lb

## 2019-06-06 DIAGNOSIS — Z4889 Encounter for other specified surgical aftercare: Secondary | ICD-10-CM

## 2019-06-06 MED ORDER — PHENTERMINE HCL 37.5 MG PO TABS: 37.5000 mg | ORAL_TABLET | Freq: Every day | ORAL | 0 refills | Status: DC

## 2019-06-08 NOTE — Progress Notes (Signed)
Postoperative Follow-up Patient presents post op from [place], BS, cystosocpy 6weeks ago for abnormal uterine bleeding.  Subjective: Patient reports marked improvement in her preop symptoms. Eating a regular diet without difficulty. The patient is not having any pain.  Activity: normal activities of daily living.  Objective: Blood pressure (!) 136/102, height 5\' 1"  (1.549 m), weight 191 lb (86.6 kg), last menstrual period 02/07/2019.  General: NAD Pulmonary: no increased work of breathing Abdomen: soft, non-tender, non-distended, incision(s) D/C/I GU: normal external female genitalia vaginal cuff intact, well healed Extremities: no edema Neurologic: normal gait  Admission on 03/24/2019, Discharged on 03/25/2019  Component Date Value Ref Range Status  . ABO/RH(D) 03/24/2019 A POS   Final  . Antibody Screen 03/24/2019 NEG   Final  . Sample Expiration 03/24/2019    Final                   Value:03/27/2019,2359 Performed at [facility], [address] [place], [place] [address]   . Preg Test, Ur 03/24/2019 NEGATIVE  NEGATIVE Final   Comment:        THE SENSITIVITY OF THIS METHODOLOGY IS >24 mIU/mL   . SURGICAL PATHOLOGY 03/24/2019    Final-Edited                   Value:SURGICAL PATHOLOGY CASE: [id number] PATIENT: Debra Whitney Surgical Pathology Report     Specimen Submitted: A. Uterus with cervix, bilateral tubes  Clinical History: Abnormal uterine bleeding      DIAGNOSIS: A. UTERUS AND CERVIX WITH BILATERAL FALLOPIAN TUBES; TOTAL HYSTERECTOMY WITH BILATERAL SALPINGECTOMY: - CERVIX: NEGATIVE FOR DYSPLASIA AND MALIGNANCY. - ENDOMETRIUM: SECRETORY.  NEGATIVE FOR ATYPIA / EIN AND MALIGNANCY. - MYOMETRIUM: UNREMARKABLE. - UTERINE SEROSA: ENDOMETRIOSIS. - ONE FALLOPIAN TUBE: ENDOMETRIOSIS. - OPPOSITE FALLOPIAN TUBE: UNREMARKABLE.  GROSS DESCRIPTION: A. Labeled: Uterus with cervix and bilateral tubes Received: In formalin Weight: 143  grams Dimensions:      Fundus -5.5 x 6.5 x 5.0 cm      Cervix -3.0 x 3.0 cm Serosa: Pink, smooth, and shiny Cervix: Ectocervical mucosa is tan, smooth, and shiny Endocervix: Endocervical canal is 3.5 cm in length, covered by hemorrhagic mucoid material Endometrial cavity:      Dimen                         sions -3.5 cm in length and 2.5 cm in transverse      Thickness -ranging from 0.2-0.4 cm      Other findings -slightly hemorrhagic, soft Myometrium:     Thickness -2.5 cm     Other findings -slightly fibrotic changes Fallopian tubes: Bilateral detached fallopian tubes with fimbriated ends covered by smooth pink serosa.  Sectioning demonstrates a patent pinpoint lumen.  Block summary: 1 -anterior cervix 2-posterior cervix 3-4-anterior uterine wall with representative endometrium, myometrium, and serosa 5-6-posterior uterine wall with representative endometrium, myometrium, and serosa 7-representative sections of fallopian tube #1 with entirely submitted fimbriated end 8-representative sections of fallopian tube #2 with entirely submitted fimbriated end    Final Diagnosis performed by Betsy Pries, MD.   Electronically signed 03/25/2019 1:46:11PM The electronic signature indicates that the named Attending Pathologist has evaluated the specimen Technical comp                         onent performed at [place], [address], [place], [place] [address] Lab: [phone] Dir: Rush Farmer, MD, MMM  Professional component performed at [facility], [facility], [place], [place], [place] [address] Lab: [phone] Dir: Dellia Nims. Rubinas, MD   . WBC 03/25/2019 12.6* 4.0 - 10.5 K/uL Final  . RBC 03/25/2019 3.99  3.87 - 5.11 MIL/uL Final  . Hemoglobin 03/25/2019 11.0* 12.0 - 15.0 g/dL Final  . HCT 03/25/2019 35.3* 36.0 - 46.0 % Final  . MCV 03/25/2019 88.5  80.0 - 100.0 fL Final  . MCH 03/25/2019 27.6  26.0 - 34.0 pg Final  . MCHC 03/25/2019 31.2   30.0 - 36.0 g/dL Final  . RDW 03/25/2019 13.1  11.5 - 15.5 % Final  . Platelets 03/25/2019 319  150 - 400 K/uL Final  . nRBC 03/25/2019 0.0  0.0 - 0.2 % Final   Performed at [facility], [address] [place], [place] [address]  . Sodium 03/25/2019 141  135 - 145 mmol/L Final  . Potassium 03/25/2019 3.8  3.5 - 5.1 mmol/L Final  . Chloride 03/25/2019 110  98 - 111 mmol/L Final  . CO2 03/25/2019 24  22 - 32 mmol/L Final  . Glucose, Bld 03/25/2019 123* 70 - 99 mg/dL Final  . BUN 03/25/2019 <5* 6 - 20 mg/dL Final  . Creatinine, Ser 03/25/2019 0.45  0.44 - 1.00 mg/dL Final  . Calcium 03/25/2019 8.3* 8.9 - 10.3 mg/dL Final  . GFR calc non Af Amer 03/25/2019 >60  >60 mL/min Final  . GFR calc Af Amer 03/25/2019 >60  >60 mL/min Final  . Anion gap 03/25/2019 7  5 - 15 Final   Performed at [facility], [address] [place], [place] [address]    Assessment: 33 y.o. s/p TLH, BS, cystoscopy stable  Plan: Patient has done well after surgery with no apparent complications.  I have discussed the post-operative course to date, and the expected progress moving forward.  The patient understands what complications to be concerned about.  I will see the patient in routine follow up, or sooner if needed.    Activity plan: No restriction.   Malachy Mood, MD, [facility], [place]

## 2019-07-05 ENCOUNTER — Other Ambulatory Visit: Payer: Self-pay

## 2019-07-05 ENCOUNTER — Ambulatory Visit (INDEPENDENT_AMBULATORY_CARE_PROVIDER_SITE_OTHER): Payer: BC Managed Care – PPO | Admitting: Obstetrics and Gynecology

## 2019-07-05 DIAGNOSIS — Z5329 Procedure and treatment not carried out because of patient's decision for other reasons: Secondary | ICD-10-CM

## 2019-07-05 NOTE — Progress Notes (Signed)
Several attempts were made to reach the patient by myself in order to do her weight loss follow up visit but were unsucessfull

## 2019-09-04 ENCOUNTER — Ambulatory Visit: Payer: Self-pay

## 2019-11-03 ENCOUNTER — Ambulatory Visit: Payer: BC Managed Care – PPO | Admitting: Cardiovascular Disease

## 2019-11-04 ENCOUNTER — Encounter: Payer: Self-pay | Admitting: Cardiovascular Disease

## 2019-11-10 ENCOUNTER — Ambulatory Visit: Payer: BC Managed Care – PPO | Admitting: Cardiovascular Disease

## 2020-02-08 ENCOUNTER — Encounter: Payer: Self-pay | Admitting: Family

## 2020-02-08 ENCOUNTER — Ambulatory Visit (INDEPENDENT_AMBULATORY_CARE_PROVIDER_SITE_OTHER): Payer: 59 | Admitting: Family

## 2020-02-08 ENCOUNTER — Other Ambulatory Visit: Payer: Self-pay

## 2020-02-08 VITALS — BP 128/78 | HR 82 | Ht 61.0 in | Wt 188.0 lb

## 2020-02-08 DIAGNOSIS — I10 Essential (primary) hypertension: Secondary | ICD-10-CM | POA: Diagnosis not present

## 2020-02-08 DIAGNOSIS — I480 Paroxysmal atrial fibrillation: Secondary | ICD-10-CM

## 2020-02-08 DIAGNOSIS — R0789 Other chest pain: Secondary | ICD-10-CM

## 2020-02-08 MED ORDER — DILTIAZEM HCL ER 240 MG PO CP24: 240.0000 mg | ORAL_CAPSULE | Freq: Every day | ORAL | 3 refills | Status: AC

## 2020-02-08 NOTE — Patient Instructions (Addendum)
Medication Instructions:  Your physician has recommended you make the following change in your medication:   CONTINUE Toprol 50mg  daily  START Diltiazem (Cardizem) 240 mg daily  STOP Amlodipine.  *If you need a refill on your cardiac medications before your next appointment, please call your pharmacy*  Lab Work: No lab work today.  Testing/Procedures: Your EKG today shows normal sinus rhythm. No changes that make [place] worry about abnormal heart rhythms or blockages.   Follow-Up: At [facility], you and your health needs are our priority.  As part of our continuing mission to provide you with exceptional heart care, we have created designated Provider Care Teams.  These Care Teams include your primary Cardiologist (physician) and Advanced Practice Providers (APPs -  Physician Assistants and Nurse Practitioners) who all work together to provide you with the care you need, when you need it.  We recommend signing up for the patient portal called "MyChart".  Sign up information is provided on this After Visit Summary.  MyChart is used to connect with patients for Virtual Visits (Telemedicine).  Patients are able to view lab/test results, encounter notes, upcoming appointments, etc.  Non-urgent messages can be sent to your provider as well.   To learn more about what you can do with MyChart, go to [email or web address].    Your next appointment:   6 month(s)  The format for your next appointment:   In Person  Provider:    You may see Kathlyn Sacramento, MD or one of the following Advanced Practice Providers on your designated Care Team:   Murray Hodgkins, NP  Christell Faith, PA-C  Marrianne Mood, PA-C  Laurann Montana, NP  Other Instructions   Our goal is for your blood pressure to be less than 130/80.  Tips to Measure your Blood Pressure Correctly Here's what you can do to ensure a correct reading: . Don't drink a caffeinated beverage or smoke during the 30 minutes before the  test. . Sit quietly for five minutes before the test begins. . During the measurement, sit in a chair with your feet on the floor and your arm supported so your elbow is at about heart level. . The inflatable part of the cuff should completely cover at least 80% of your upper arm, and the cuff should be placed on bare skin, not over a shirt. . Don't talk during the measurement. . Have your blood pressure measured twice, with a brief break in between. If the readings are different by 5 points or more, have it done a third time.  There are times to break these rules. If you sometimes feel lightheaded when getting out of bed in the morning or when you stand after sitting, you should have your blood pressure checked while seated and then while standing to see if it falls from one position to the next.  Because blood pressure varies throughout the day, your doctor will rarely diagnose hypertension on the basis of a single reading. Instead, he or she will want to confirm the measurements on at least two occasions, usually within a few weeks of one another. The exception to this rule is if you have a blood pressure reading of 180/110 mm Hg or higher. A result this high usually calls for prompt treatment.  It's a good idea to have your blood pressure measured in both arms at least once, since the reading in one arm (usually the right) may be higher than that in the left. A 2014 study in [employer] [place]  of Medicine of nearly 3,400 people found average arm- to-arm differences in systolic blood pressure of about 5 points. The higher number should be used to make treatment decisions.  In 2017, new guidelines from the [place], the [employer] of Cardiology, and nine other health organizations lowered the diagnosis of high blood pressure to 130/80 mm Hg or higher for all adults. The guidelines also redefined the various blood pressure categories to now include normal, elevated, Stage 1  hypertension, Stage 2 hypertension, and hypertensive crisis (see "Blood pressure categories").  Blood pressure categories  Blood pressure category SYSTOLIC (upper number)  DIASTOLIC (lower number)  Normal Less than 120 mm Hg and Less than 80 mm Hg  Elevated 120-129 mm Hg and Less than 80 mm Hg  High blood pressure: Stage 1 hypertension 130-139 mm Hg or 80-89 mm Hg  High blood pressure: Stage 2 hypertension 140 mm Hg or higher or 90 mm Hg or higher  Hypertensive crisis (consult your doctor immediately) Higher than 180 mm Hg and/or Higher than 120 mm Hg  Source: American Heart Association and American Stroke Association. For more on getting your blood pressure under control, buy Controlling Your Blood Pressure, a Special Health Report from [facility].   Blood Pressure Log   Date   Time  Blood Pressure  Position  Example: Nov 1 9 AM 124/78 sitting                                                    Kardia for monitoring of EKGs at home: You can look into the [facility] device by [employer].This device is purchased by you and it connects to an application you download to your smart phone.  It can detect abnormal heart rhythms and alert you to contact your doctor for further evaluation. The device is approximately $90 and the phone application is free.  The web site is:  https://www.alivecor.com

## 2020-02-08 NOTE — Progress Notes (Signed)
Office Visit    Patient Name: Debra Whitney Date of Encounter: 02/08/2020  Primary Care Provider:  [facility] [place] Primary Primary Cardiologist:  Kathlyn Sacramento, MD Electrophysiologist:  None   Chief Complaint    Debra Whitney is a 34 y.o. female with a hx of PAF, HTN, GAD, GERD, migraines presents today for chest pressure   Past Medical History    Past Medical History:  Diagnosis Date  . Anxiety   . Atrial fibrillation ([place])    a. diagnosed on 10/20/2016; chads2vasc => at least 2 (HTN, sex category), possibly 3 given history of gestational diabetes  . Depression   . Dysrhythmia    afib and tachy rates  . Endometriosis   . GERD (gastroesophageal reflux disease)   . Gestational diabetes   . Hypertension   . Menorrhagia   . Migraine   . Obesity   . PONV (postoperative nausea and vomiting)    Past Surgical History:  Procedure Laterality Date  . CESAREAN SECTION  01/28/2018  . CYSTOSCOPY N/A 03/24/2019   Procedure: CYSTOSCOPY;  Surgeon: Malachy Mood, MD;  Location: [facility] ORS;  Service: Gynecology;  Laterality: N/A;  . DILATION AND CURETTAGE OF UTERUS    . endometrial surgery    . HERNIA REPAIR     umbilical  . HYSTEROSCOPY WITH D & C  11/29/2015   Procedure: DILATATION AND CURETTAGE /HYSTEROSCOPY;  Surgeon: Malachy Mood, MD;  Location: [facility] ORS;  Service: Gynecology;;  . LAPAROSCOPY N/A 11/29/2015   Procedure: LAPAROSCOPY DIAGNOSTIC;  Surgeon: Malachy Mood, MD;  Location: [facility] ORS;  Service: Gynecology;  Laterality: N/A;  . NASAL SINUS SURGERY    . TOTAL LAPAROSCOPIC HYSTERECTOMY WITH SALPINGECTOMY N/A 03/24/2019   Procedure: TOTAL LAPAROSCOPIC HYSTERECTOMY WITH SALPINGECTOMY;  Surgeon: Malachy Mood, MD;  Location: [facility] ORS;  Service: Gynecology;  Laterality: N/A;  . TUBAL LIGATION  01/28/2018  . WISDOM TOOTH EXTRACTION     Allergies No Known Allergies  History of Present Illness    Debra Whitney is a 34 y.o. female with a hx of PAF, HTN,  GAD, GERD, migraines last seen 05/2019 via telemedicine by Dr. Fletcher Anon.  Brief episode of atrial fibrillation in 2018 in the setting of large alcohol intake with vomiting hypokalemia.  Converted to normal sinus rhythm within 24 hours.  Echo at that time was normal.  Anticoagulated for 3 months and decided not to continue long-term anticoagulation due to isolated episode.  Chads to score of 2 given sex and hypertension.  Seen 03/2019 in clinic for elevated blood pressure in the setting preoperative evaluation before hysterectomy.  She was overall low risk for due to elevated blood pressure amlodipine 5 mg daily was added.  This is since been escalated to 10 mg by her primary care provider.  She has been maintained on metoprolol succinate 50 mg daily for palpitations.  She has a 73-year-old son, 33-year-old and 57 year old daughters, and almost 86 year old son.  Presents today for sensation of chest discomfort. She endorses lots of recent stress with multiple family situations.    She has had a few episodes of feeling hot and flushed and when she checks her blood pressure it is elevated.  A few times she is taking an extra amlodipine or Toprol.  Checking her blood pressure intermittently at home.    She has taken up at [facility] class for weight loss.  She had lost 20 pounds while working out.  Reports no chest pain, pressure, tightness with this exercise.  Endorses more recent headaches.  She has not been eating regular meals nor drinking regularly as she is working very hard helping multiple family members.  When asked to describe her chest discomfort she tells me it feels like a "pressure" or "tightness". Feels like it is uncomfortable, but not truly pain. Relieved by deep breath. Happens both with activity and rest.  Not associated with shortness of breath nor diaphoresis.  EKGs/Labs/Other Studies Reviewed:   The following studies were reviewed today:   EKG:  EKG is ordered today.  The ekg ordered  today demonstrates SR 82 bpm with no acute ST/T wave changes.   Recent Labs: 02/18/2019: TSH 1.120 04/02/2019: BUN 5; Hemoglobin 11.3; Platelets 358; Potassium 3.6; Sodium 140 04/04/2019: Creatinine, Ser 0.63  Recent Lipid Panel    Component Value Date/Time   CHOL 147 10/21/2016 0137   TRIG 178 (H) 10/21/2016 0137   HDL 41 10/21/2016 0137   CHOLHDL 3.6 10/21/2016 0137   VLDL 36 10/21/2016 0137   LDLCALC 70 10/21/2016 0137    Home Medications   Current Meds  Medication Sig  . amLODipine (NORVASC) 10 MG tablet Take 10 mg by mouth daily.  . cetirizine (ZYRTEC) 10 MG tablet Take 10 mg by mouth daily.  . fluticasone (FLONASE) 50 MCG/ACT nasal spray Place 2 sprays into both nostrils daily.  Marland Kitchen ibuprofen (ADVIL) 600 MG tablet Take 1 tablet (600 mg total) by mouth every 6 (six) hours as needed.  . metoprolol succinate (TOPROL-XL) 50 MG 24 hr tablet Take 50 mg by mouth daily. Take with or immediately following a meal.  . omeprazole (PRILOSEC) 40 MG capsule Take 40 mg by mouth daily.  . sertraline (ZOLOFT) 100 MG tablet Take by mouth.    Review of Systems  All other systems reviewed and are otherwise negative except as noted above.  Physical Exam    VS:  BP 128/78 (BP Location: Left Arm, Patient Position: Sitting, Cuff Size: Normal)   Pulse 82   Ht 5\' 1"  (1.549 m)   Wt 188 lb (85.3 kg)   LMP 02/07/2019   SpO2 98%   BMI 35.52 kg/m  , BMI Body mass index is 35.52 kg/m. GEN: Well nourished, well developed, in no acute distress. HEENT: normal. Neck: Supple, no JVD, carotid bruits, or masses. Cardiac: RRR, no murmurs, rubs, or gallops. No clubbing, cyanosis, edema.  Radials/DP/PT 2+ and equal bilaterally.  Respiratory:  Respirations regular and unlabored, clear to auscultation bilaterally. GI: Soft, nontender, nondistended, BS + x 4. MS: No deformity or atrophy. Skin: Warm and dry, no rash. Neuro:  Strength and sensation are intact. Psych: Normal affect.  Assessment & Plan      1. Atypical chest pain -midsternal chest discomfort that does not radiate which occurs at rest and with activity in the setting of multiple recent stressors.  EKG today without acute ST/T wave changes.  No worsening palpitations no changes in breathing.  Low suspicion cardiac etiology as it is very atypical for angina.  No indication for ischemic evaluation at this time.  2. PAF - No evidence of recurrence. Maintaining NSR by EKG today. Discussed purchasing Kardia device for home monitoring.   3. HTN -multiple episodes of hypotension with systolic BP as high as 889V at home.  Likely exacerbated by recent stress.  However, does not feel the amlodipine is controlling her blood pressure well. Tells me she previously was on Losartan but had difficulties with her renal function. We will stop amlodipine and start diltiazem 240  mg daily.  Check in via MyChart message in 1 week.  4. Palpitations - Well controlled on present dose of Toprol.  5. GAD - Continue to follow with PCP. Multiple recent family situations providing increased stress. Encouraged to establish with a therapist.   Disposition: Follow up in 6 month(s) with Dr. Fletcher Anon or APP.   Loel Dubonnet, NP 02/08/2020, 10:25 AM

## 2020-02-15 ENCOUNTER — Encounter: Payer: Self-pay | Admitting: Family

## 2020-06-05 ENCOUNTER — Other Ambulatory Visit: Payer: Self-pay | Admitting: Internal Medicine

## 2020-06-05 MED ORDER — AZITHROMYCIN 250 MG PO TABS: ORAL_TABLET | ORAL | 0 refills | Status: DC

## 2020-06-29 ENCOUNTER — Ambulatory Visit: Payer: 59 | Admitting: Nurse Practitioner

## 2020-07-02 ENCOUNTER — Encounter: Payer: Self-pay | Admitting: Nurse Practitioner

## 2020-07-05 ENCOUNTER — Other Ambulatory Visit: Payer: 59

## 2020-07-05 DIAGNOSIS — Z20822 Contact with and (suspected) exposure to covid-19: Secondary | ICD-10-CM

## 2020-07-06 LAB — SARS-COV-2, NAA 2 DAY TAT

## 2020-07-06 LAB — NOVEL CORONAVIRUS, NAA: SARS-CoV-2, NAA: NOT DETECTED

## 2020-09-27 ENCOUNTER — Telehealth: Payer: Self-pay | Admitting: Cardiovascular Disease

## 2020-09-27 NOTE — Telephone Encounter (Signed)
Patient has been contacted at least 3 times for a recall, recall has been removed

## 2021-03-16 ENCOUNTER — Other Ambulatory Visit: Payer: Self-pay | Admitting: Internal Medicine

## 2021-03-16 MED ORDER — FLUCONAZOLE 150 MG PO TABS: 150.0000 mg | ORAL_TABLET | Freq: Once | ORAL | 0 refills | Status: AC

## 2021-03-16 MED ORDER — NITROFURANTOIN MONOHYD MACRO 100 MG PO CAPS: 100.0000 mg | ORAL_CAPSULE | Freq: Two times a day (BID) | ORAL | 0 refills | Status: DC

## 2021-03-22 ENCOUNTER — Telehealth: Payer: Self-pay | Admitting: Cardiovascular Disease

## 2021-03-22 NOTE — Telephone Encounter (Signed)
I agree with ED evaluation if symptoms are still ongoing.  Otherwise, patient should be seen by APP at her earliest convenience.  Nelva Bush, MD [facility]

## 2021-03-22 NOTE — Telephone Encounter (Signed)
I spoke with the patient. I have advised her of Dr. Darnelle Bos recommendations as stated below. Per the patient, she is feeling some better than when she first called the office earlier today.  She states her husband has been out of town for work for the last 2 weeks. She has been in the ER with her 35 year old twice this week for fluids. She is unsure when her husband will be back in town- stress is most certainly contributing to some of what she is feeling. However, I have advised her to please report to the ER for worsening/ ongoing symptoms. Otherwise, she is scheduled to follow up with Cadence Furth, PA on 03/26/21.  The patient voices understanding and is agreeable.

## 2021-03-22 NOTE — Telephone Encounter (Signed)
Spoke with pt and for the past 2 weeks has noted chest pressure Per pt couldn't sleep  last night has a sick child as well also notes h/a,weakness ,and fatigue Pt also gave blood last Friday HR normally 115-140 and when gave blood last week HR was 90 and B/P was elevated Pt also notes change in pain if turns a certain way and pt had  these symptoms when had afib Enouraged to go to ED if S/S worsen has appt next Wednesday with Cadence Kathlen Mody PA Will forward to Dr Fletcher Anon for review and recommendations .

## 2021-03-22 NOTE — Telephone Encounter (Signed)
Pt c/o of Chest Pain: STAT if CP now or developed within 24 hours  1. Are you having CP right now? Yes pressure   2. Are you experiencing any other symptoms (ex. SOB, nausea, vomiting, sweating)? Positional chest pain sweating nausea headache back pain fatigue but recently donated blood   3. How long have you been experiencing CP? 2 weeks   4. Is your CP continuous or coming and going? Pressure constant pain comes and goes with change of position    5. Have you taken Nitroglycerin? No   ?

## 2021-03-25 NOTE — Progress Notes (Signed)
Cardiology Office Note:    Date:  03/27/2021   ID:  Debra Whitney, DOB February 16, 1986, MRN [id number]  PCP:  Wayland Denis, PA-C  [facility] Cardiologist:  Kathlyn Sacramento, MD  [facility] Electrophysiologist:  None   Referring MD: Wayland Denis, PA-C   Chief Complaint: chest tightness  History of Present Illness:    Debra Whitney is a 35 y.o. female with a hx of PAF, hypertension, GAD, GERD, migraines who presents for chest pressure.  Brief episode of atrial fibrillation in 2018 in the setting of large alcohol intake with vomiting hypokalemia.  Converted to normal sinus rhythm within 24 hours.  Echo at that time was normal.  Anticoagulated for 3 months and decided not to continue long-term anticoagulation due to isolated episode.  Chads to score of 2 given sex and hypertension.   Seen 03/2019 in clinic for elevated blood pressure in the setting preoperative evaluation before hysterectomy. She was overall low risk for due to elevated blood pressure amlodipine 5 mg daily was added. This is since been escalated to 10 mg by her primary care provider. She has been maintained on metoprolol succinate 50 mg daily for palpitations.  Last seen 02/08/2020 reported stressful situations at home. Lost about 20 pounds with diet and exercise.  Stable from a cardiac perspective.  Today, the patient reports chest pressure in the center of her chest. Last week when she laid down it hurt and it scared her. It felt like a chest heaviness, also had some sharp pain. Feels the pressure constantly. Gets worse with activity. Also feels it might be related to high blood pressure. She has a lot of stress and 4 kids.  Also has SOB with chest pressure. Feels a dull pressure now. Has occasional palpitations. Feels breathing and activity changed after covid vaccines. Since this she has stopped working out. No tobacco use. Alcohol use is occasional.   Past Medical History:  Diagnosis Date   Anxiety     Atrial fibrillation ([place])    a. diagnosed on 10/20/2016; chads2vasc => at least 2 (HTN, sex category), possibly 3 given history of gestational diabetes   Depression    Dysrhythmia    afib and tachy rates   Endometriosis    GERD (gastroesophageal reflux disease)    Gestational diabetes    Hypertension    Menorrhagia    Migraine    Obesity    PONV (postoperative nausea and vomiting)     Past Surgical History:  Procedure Laterality Date   CESAREAN SECTION  01/28/2018   CYSTOSCOPY N/A 03/24/2019   Procedure: CYSTOSCOPY;  Surgeon: Malachy Mood, MD;  Location: [facility] ORS;  Service: Gynecology;  Laterality: N/A;   DILATION AND CURETTAGE OF UTERUS     endometrial surgery     HERNIA REPAIR     umbilical   HYSTEROSCOPY WITH D & C  11/29/2015   Procedure: DILATATION AND CURETTAGE /HYSTEROSCOPY;  Surgeon: Malachy Mood, MD;  Location: [facility] ORS;  Service: Gynecology;;   LAPAROSCOPY N/A 11/29/2015   Procedure: LAPAROSCOPY DIAGNOSTIC;  Surgeon: Malachy Mood, MD;  Location: [facility] ORS;  Service: Gynecology;  Laterality: N/A;   NASAL SINUS SURGERY     TOTAL LAPAROSCOPIC HYSTERECTOMY WITH SALPINGECTOMY N/A 03/24/2019   Procedure: TOTAL LAPAROSCOPIC HYSTERECTOMY WITH SALPINGECTOMY;  Surgeon: Malachy Mood, MD;  Location: [facility] ORS;  Service: Gynecology;  Laterality: N/A;   TUBAL LIGATION  01/28/2018   WISDOM TOOTH EXTRACTION      Current Medications: Current Meds  Medication Sig  amLODipine (NORVASC) 5 MG tablet Take 5 mg by mouth daily.   azithromycin (ZITHROMAX) 250 MG tablet Take 2 tabs today, then 1 tab daily x 4 days   cetirizine (ZYRTEC) 10 MG tablet Take 10 mg by mouth daily.   diltiazem (DILT-XR) 240 MG 24 hr capsule Take 1 capsule (240 mg total) by mouth daily.   fluticasone (FLONASE) 50 MCG/ACT nasal spray Place 2 sprays into both nostrils daily.   ibuprofen (ADVIL) 600 MG tablet Take 1 tablet (600 mg total) by mouth every 6 (six) hours as needed.   metoprolol succinate  (TOPROL-XL) 50 MG 24 hr tablet Take 50 mg by mouth daily. Take with or immediately following a meal.   metoprolol tartrate (LOPRESSOR) 100 MG tablet Take 1 tablet (100 mg total) by mouth once for 1 dose. Take 2 hours before your test.   nitrofurantoin, macrocrystal-monohydrate, (MACROBID) 100 MG capsule Take 1 capsule (100 mg total) by mouth 2 (two) times daily.   omeprazole (PRILOSEC) 40 MG capsule Take 40 mg by mouth daily.     Allergies:   Patient has no known allergies.   Social History   Socioeconomic History   Marital status: Married    Spouse name: michael   Number of children: 4   Years of education: Not on file   Highest education level: Not on file  Occupational History   Occupation: homemaker  Tobacco Use   Smoking status: Never   Smokeless tobacco: Never  Vaping Use   Vaping Use: Never used  Substance and Sexual Activity   Alcohol use: Yes    Comment: occasionally   Drug use: No   Sexual activity: Yes    Birth control/protection: None  Other Topics Concern   Not on file  Social History Narrative   4 children   Married                   Social Determinants of Health   Financial Resource Strain: Not on file  Food Insecurity: Not on file  Transportation Needs: Not on file  Physical Activity: Not on file  Stress: Not on file  Social Connections: Not on file     Family History: The patient's family history includes CVA in her maternal grandmother; Diabetes in her mother; Hypertension in her mother.  ROS:   Please see the history of present illness.     All other systems reviewed and are negative.  EKGs/Labs/Other Studies Reviewed:    The following studies were reviewed today:  Echo 2018 Study Conclusions   - Left ventricle: The cavity size was normal. Wall thickness was    increased in a pattern of mild LVH. Systolic function was normal.    The estimated ejection fraction was in the range of 60% to 65%.  - Mitral valve: There was mild  regurgitation.  - Right ventricle: The cavity size was normal. Wall thickness was    normal. Systolic function was normal.   EKG:  EKG is  ordered today.  The ekg ordered today demonstrates NSR, 86bpm, nonspecific T wave changes  Recent Labs: 03/27/2021: ALT 29; BUN 6; Creatinine, Ser 0.57; Hemoglobin 12.2; Platelets 396; Potassium 4.3; Sodium 138; TSH 1.706  Recent Lipid Panel    Component Value Date/Time   CHOL 186 03/27/2021 1036   TRIG 139 03/27/2021 1036   HDL 57 03/27/2021 1036   CHOLHDL 3.3 03/27/2021 1036   VLDL 28 03/27/2021 1036   LDLCALC 101 (H) 03/27/2021 1036  Physical Exam:    VS:  BP 126/74 (BP Location: Left Arm, Patient Position: Sitting, Cuff Size: Normal)   Pulse 86   Ht 5\' 1"  (1.549 m)   Wt 190 lb (86.2 kg)   LMP 02/07/2019   SpO2 98%   BMI 35.90 kg/m     Wt Readings from Last 3 Encounters:  03/27/21 190 lb (86.2 kg)  02/08/20 188 lb (85.3 kg)  06/06/19 191 lb (86.6 kg)     GEN:  Well nourished, well developed in no acute distress HEENT: Normal NECK: No JVD; No carotid bruits LYMPHATICS: No lymphadenopathy CARDIAC: RRR, no murmurs, rubs, gallops RESPIRATORY:  Clear to auscultation without rales, wheezing or rhonchi  ABDOMEN: Soft, non-tender, non-distended MUSCULOSKELETAL:  No edema; No deformity  SKIN: Warm and dry NEUROLOGIC:  Alert and oriented x 3 PSYCHIATRIC:  Normal affect   ASSESSMENT:    1. Precordial pain   2. PAF (paroxysmal atrial fibrillation) ([place])   3. Atypical chest pain   4. Essential hypertension    PLAN:    In order of problems listed above:  Atypical chest pain Reports atypical chest pain that started last week. Has been constant, worse with laying down and exertion. Also feels short of breath, but feels this has been since the Covid vaccines. RF for CAD include HTN, obesity. EKG with no ischemic changes. I will order cardiac CT and an echo. I will check CMET, CBC, lipid panel and TSH. Overall low suspicion for  cardiac etiology. Lifestyle changes encouraged.   PAF No known reoccurrence. EKG today shows NSR. She has palpitations that are relatively controlled. Continue Toprol for rate control. If chest pressure continues, may consider event monitor to monitor for reoccurrence afib.   HTN BP normal. Continue current medcations.   GAD Reports very stressful situation at home. Lifestyle changes discussed such as diet and exercise to help reduce stress.   Disposition: Follow up in 2 month(s) with MD/APP   Signed, Anvita Hirata Ninfa Meeker, PA-C  03/27/2021 12:36 PM    [facility]

## 2021-03-27 ENCOUNTER — Telehealth: Payer: Self-pay | Admitting: Medical

## 2021-03-27 ENCOUNTER — Ambulatory Visit (INDEPENDENT_AMBULATORY_CARE_PROVIDER_SITE_OTHER): Payer: 59 | Admitting: Medical

## 2021-03-27 ENCOUNTER — Encounter: Payer: Self-pay | Admitting: Medical

## 2021-03-27 ENCOUNTER — Other Ambulatory Visit: Payer: Self-pay

## 2021-03-27 ENCOUNTER — Other Ambulatory Visit
Admission: RE | Admit: 2021-03-27 | Discharge: 2021-03-27 | Disposition: A | Discharge status: Home / Self Care | Payer: 59 | Source: Ambulatory Visit | Attending: Medical | Admitting: Medical

## 2021-03-27 VITALS — BP 126/74 | HR 86 | Ht 61.0 in | Wt 190.0 lb

## 2021-03-27 DIAGNOSIS — I480 Paroxysmal atrial fibrillation: Secondary | ICD-10-CM

## 2021-03-27 DIAGNOSIS — R0789 Other chest pain: Secondary | ICD-10-CM

## 2021-03-27 DIAGNOSIS — R072 Precordial pain: Secondary | ICD-10-CM | POA: Diagnosis present

## 2021-03-27 DIAGNOSIS — I10 Essential (primary) hypertension: Secondary | ICD-10-CM | POA: Diagnosis not present

## 2021-03-27 LAB — COMPREHENSIVE METABOLIC PANEL
ALT: 29 U/L (ref 0–44)
AST: 24 U/L (ref 15–41)
Albumin: 4.1 g/dL (ref 3.5–5.0)
Alkaline Phosphatase: 54 U/L (ref 38–126)
Anion gap: 6 (ref 5–15)
BUN: 6 mg/dL (ref 6–20)
CO2: 28 mmol/L (ref 22–32)
Calcium: 8.9 mg/dL (ref 8.9–10.3)
Chloride: 104 mmol/L (ref 98–111)
Creatinine, Ser: 0.57 mg/dL (ref 0.44–1.00)
GFR, Estimated: >60 mL/min (ref >60)
Glucose, Bld: 113 mg/dL — ABNORMAL HIGH (ref 70–99)
Potassium: 4.3 mmol/L (ref 3.5–5.1)
Sodium: 138 mmol/L (ref 135–145)
Total Bilirubin: 0.5 mg/dL (ref 0.3–1.2)
Total Protein: 7.3 g/dL (ref 6.5–8.1)

## 2021-03-27 LAB — CBC
HCT: 38 % (ref 36.0–46.0)
Hemoglobin: 12.2 g/dL (ref 12.0–15.0)
MCH: 28.1 pg (ref 26.0–34.0)
MCHC: 32.1 g/dL (ref 30.0–36.0)
MCV: 87.6 fL (ref 80.0–100.0)
Platelets: 396 10*3/uL (ref 150–400)
RBC: 4.34 MIL/uL (ref 3.87–5.11)
RDW: 13.3 % (ref 11.5–15.5)
WBC: 7.4 10*3/uL (ref 4.0–10.5)
nRBC: 0 % (ref 0.0–0.2)

## 2021-03-27 LAB — TSH: TSH: 1.706 u[IU]/mL (ref 0.350–4.500)

## 2021-03-27 LAB — LIPID PANEL
Cholesterol: 186 mg/dL (ref 0–200)
HDL: 57 mg/dL (ref >40)
LDL Cholesterol: 101 mg/dL — ABNORMAL HIGH (ref 0–99)
Total CHOL/HDL Ratio: 3.3 RATIO
Triglycerides: 139 mg/dL (ref <150)
VLDL: 28 mg/dL (ref 0–40)

## 2021-03-27 MED ORDER — METOPROLOL TARTRATE 100 MG PO TABS: 100.0000 mg | ORAL_TABLET | Freq: Once | ORAL | 0 refills | Status: AC

## 2021-03-27 NOTE — Telephone Encounter (Signed)
Patient returned call and had not got call with appointment date and time. Reviewed that it was scheduled for 04/11/21 at 08:45 AM with arrival time of 08:30 AM. She verbalized understanding and requested that I send this information to her in My Chart. Advised that I would also send her number in case she needs to reschedule. Requested that she please call back if she should have any further questions.

## 2021-03-27 NOTE — Telephone Encounter (Signed)
Called to verify if patient received information on the date and time for her Cardiac CT. Requested that she please call back to confirm.

## 2021-03-27 NOTE — Patient Instructions (Addendum)
Medication Instructions:  No changes at this time.   *If you need a refill on your cardiac medications before your next appointment, please call your pharmacy*   Lab Work: CMET, CBC, Lipid panel, and TSH today  If you have labs (blood work) drawn today and your tests are completely normal, you will receive your results only by: [place] (if you have MyChart) OR A paper copy in the mail If you have any lab test that is abnormal or we need to change your treatment, we will call you to review the results.   Testing/Procedures: Your physician has requested that you have an echocardiogram.  Echocardiography is a painless test that uses sound waves to create images of your heart. It provides your doctor with information about the size and shape of your heart and how well your heart's chambers and valves are working. This procedure takes approximately one hour. There are no restrictions for this procedure.     Your cardiac CT will be scheduled at the below location:    [facility] [address] [place], [place] [address] [phone]    Please arrive 15 mins early for check-in and test prep.  Please follow these instructions carefully (unless otherwise directed):  Hold all erectile dysfunction medications at least 3 days (72 hrs) prior to test.  On the Night Before the Test: Be sure to Drink plenty of water. Do not consume any caffeinated/decaffeinated beverages or chocolate 12 hours prior to your test. Do not take any antihistamines 12 hours prior to your test.   On the Day of the Test: Drink plenty of water until 1 hour prior to the test. Do not eat any food 4 hours prior to the test. You may take your regular medications prior to the test.  Take metoprolol (Lopressor) tartrate 100 mg two hours prior to test. FEMALES- please wear underwire-free bra if available, avoid dresses & tight clothing        After the  Test: Drink plenty of water. After receiving IV contrast, you may experience a mild flushed feeling. This is normal. On occasion, you may experience a mild rash up to 24 hours after the test. This is not dangerous. If this occurs, you can take Benadryl 25 mg and increase your fluid intake. If you experience trouble breathing, this can be serious. If it is severe call 911 IMMEDIATELY. If it is mild, please call our office. If you take any of these medications: Glipizide/Metformin, Avandament, Glucavance, please do not take 48 hours after completing test unless otherwise instructed.  Please allow 2-4 weeks for scheduling of routine cardiac CTs. Some insurance companies require a pre-authorization which may delay scheduling of this test.   For non-scheduling related questions, please contact the cardiac imaging nurse navigator should you have any questions/concerns: Marchia Bond, Cardiac Imaging Nurse Navigator Gordy Clement, Cardiac Imaging Nurse Navigator [facility] Direct Office Dial: [phone]   For scheduling needs, including cancellations and rescheduling, please call [place], [phone].    Follow-Up: At [facility], you and your health needs are our priority.  As part of our continuing mission to provide you with exceptional heart care, we have created designated Provider Care Teams.  These Care Teams include your primary Cardiologist (physician) and Advanced Practice Providers (APPs -  Physician Assistants and Nurse Practitioners) who all work together to provide you with the care you need, when you need it.   Your next appointment:   Follow  up after testing.   The format for your next appointment:   In Person  Provider:   You may see Kathlyn Sacramento, MD or one of the following Advanced Practice Providers on your designated Care Team:   Murray Hodgkins, NP Christell Faith, PA-C Marrianne Mood, PA-C [place], [place]

## 2021-04-04 ENCOUNTER — Other Ambulatory Visit: Payer: Self-pay | Admitting: Internal Medicine

## 2021-04-04 ENCOUNTER — Other Ambulatory Visit: Payer: Self-pay | Admitting: Medical

## 2021-04-09 ENCOUNTER — Encounter (HOSPITAL_COMMUNITY): Payer: Self-pay

## 2021-04-09 ENCOUNTER — Telehealth (HOSPITAL_COMMUNITY): Payer: Self-pay | Admitting: Emergency Medicine

## 2021-04-09 NOTE — Telephone Encounter (Signed)
Reaching out to patient to offer assistance regarding upcoming cardiac imaging study; pt verbalizes understanding of appt date/time, parking situation and where to check in, pre-test NPO status and medications ordered, and verified current allergies; name and call back number provided for further questions should they arise Marchia Bond RN Navigator Cardiac Imaging [facility] [phone] office [phone] cell  100mg  metoprolol tart

## 2021-04-11 ENCOUNTER — Other Ambulatory Visit: Payer: Self-pay

## 2021-04-11 ENCOUNTER — Ambulatory Visit
Admission: RE | Admit: 2021-04-11 | Discharge: 2021-04-11 | Disposition: A | Discharge status: Home / Self Care | Payer: 59 | Source: Ambulatory Visit | Attending: Medical | Admitting: Medical

## 2021-04-11 DIAGNOSIS — R072 Precordial pain: Secondary | ICD-10-CM

## 2021-04-11 MED ORDER — METOPROLOL TARTRATE 5 MG/5ML IV SOLN
5.0000 mg | Freq: Once | INTRAVENOUS | Status: AC
  Administered 2021-04-11: 5 mg via INTRAVENOUS

## 2021-04-11 MED ORDER — METOPROLOL TARTRATE 5 MG/5ML IV SOLN: 10.0000 mg | Freq: Once | INTRAVENOUS | Status: DC

## 2021-04-11 MED ORDER — NITROGLYCERIN 0.4 MG SL SUBL
0.8000 mg | SUBLINGUAL_TABLET | Freq: Once | SUBLINGUAL | Status: AC
  Administered 2021-04-11: 0.8 mg via SUBLINGUAL

## 2021-04-11 MED ORDER — METOPROLOL TARTRATE 5 MG/5ML IV SOLN
10.0000 mg | Freq: Once | INTRAVENOUS | Status: AC
  Administered 2021-04-11: 10 mg via INTRAVENOUS

## 2021-04-11 MED ORDER — IOHEXOL 350 MG/ML SOLN
100.0000 mL | Freq: Once | INTRAVENOUS | Status: AC | PRN
  Administered 2021-04-11: 100 mL via INTRAVENOUS

## 2021-04-11 NOTE — Progress Notes (Signed)
Patient tolerated CT well. Drank water and a soda after as well as ate crackers. Vital signs stable encourage to drink water throughout day.Reasons explained and verbalized understanding. Ambulated steady gait.

## 2021-04-25 ENCOUNTER — Ambulatory Visit (INDEPENDENT_AMBULATORY_CARE_PROVIDER_SITE_OTHER): Payer: 59

## 2021-04-25 ENCOUNTER — Other Ambulatory Visit: Payer: Self-pay

## 2021-04-25 DIAGNOSIS — I480 Paroxysmal atrial fibrillation: Secondary | ICD-10-CM | POA: Diagnosis not present

## 2021-04-25 DIAGNOSIS — R0789 Other chest pain: Secondary | ICD-10-CM | POA: Diagnosis not present

## 2021-04-25 DIAGNOSIS — R072 Precordial pain: Secondary | ICD-10-CM | POA: Diagnosis not present

## 2021-04-25 LAB — ECHOCARDIOGRAM COMPLETE
AR max vel: 3.64 cm2
AV Area VTI: 4.15 cm2
AV Area mean vel: 3.49 cm2
AV Mean grad: 6 mmHg
AV Peak grad: 10.6 mmHg
Ao pk vel: 1.63 m/s
Area-P 1/2: 3.34 cm2
Calc EF: 61.3 %
S' Lateral: 3.5 cm
Single Plane A2C EF: 65.9 %
Single Plane A4C EF: 53.9 %

## 2021-05-01 ENCOUNTER — Other Ambulatory Visit: Payer: 59

## 2021-05-07 NOTE — Progress Notes (Deleted)
Cardiology Office Note:    Date:  05/07/2021   ID:  Debra Whitney, DOB 1986/01/28, MRN [id number]  PCP:  Wayland Denis, PA-C  [facility] Cardiologist:  Kathlyn Sacramento, MD  [place] Electrophysiologist:  None   Referring MD: Wayland Denis, PA-C   Chief Complaint: F/u  History of Present Illness:    Debra Whitney is a 35 y.o. female with a hx of with a hx of PAF, hypertension, GAD, GERD, migraines who presents for chest pressure.   Brief episode of atrial fibrillation in 2018 in the setting of large alcohol intake with vomiting hypokalemia.  Converted to normal sinus rhythm within 24 hours.  Echo at that time was normal.  Anticoagulated for 3 months and decided not to continue long-term anticoagulation due to isolated episode.  Chads to score of 2 given sex and hypertension.   Seen 03/2019 in clinic for elevated blood pressure in the setting preoperative evaluation before hysterectomy. She was overall low risk for due to elevated blood pressure amlodipine 5 mg daily was added. This is since been escalated to 10 mg by her primary care provider. She has been maintained on metoprolol succinate 50 mg daily for palpitations.  Last seen 03/27/21 and reported atypical chest pressure, and increased stress. Cardiac CTA was ordered.   Today,     Past Medical History:  Diagnosis Date   Anxiety    Atrial fibrillation ([place])    a. diagnosed on 10/20/2016; chads2vasc => at least 2 (HTN, sex category), possibly 3 given history of gestational diabetes   Depression    Dysrhythmia    afib and tachy rates   Endometriosis    GERD (gastroesophageal reflux disease)    Gestational diabetes    Hypertension    Menorrhagia    Migraine    Obesity    PONV (postoperative nausea and vomiting)     Past Surgical History:  Procedure Laterality Date   CESAREAN SECTION  01/28/2018   CYSTOSCOPY N/A 03/24/2019   Procedure: CYSTOSCOPY;  Surgeon: Malachy Mood, MD;  Location: [facility]  ORS;  Service: Gynecology;  Laterality: N/A;   DILATION AND CURETTAGE OF UTERUS     endometrial surgery     HERNIA REPAIR     umbilical   HYSTEROSCOPY WITH D & C  11/29/2015   Procedure: DILATATION AND CURETTAGE /HYSTEROSCOPY;  Surgeon: Malachy Mood, MD;  Location: [facility] ORS;  Service: Gynecology;;   LAPAROSCOPY N/A 11/29/2015   Procedure: LAPAROSCOPY DIAGNOSTIC;  Surgeon: Malachy Mood, MD;  Location: [facility] ORS;  Service: Gynecology;  Laterality: N/A;   NASAL SINUS SURGERY     TOTAL LAPAROSCOPIC HYSTERECTOMY WITH SALPINGECTOMY N/A 03/24/2019   Procedure: TOTAL LAPAROSCOPIC HYSTERECTOMY WITH SALPINGECTOMY;  Surgeon: Malachy Mood, MD;  Location: [facility] ORS;  Service: Gynecology;  Laterality: N/A;   TUBAL LIGATION  01/28/2018   WISDOM TOOTH EXTRACTION      Current Medications: No outpatient medications have been marked as taking for the 05/10/21 encounter (Appointment) with Kathlen Mody, Janoah Menna H, PA-C.     Allergies:   Percocet [oxycodone-acetaminophen]   Social History   Socioeconomic History   Marital status: Married    Spouse name: michael   Number of children: 4   Years of education: Not on file   Highest education level: Not on file  Occupational History   Occupation: homemaker  Tobacco Use   Smoking status: Never   Smokeless tobacco: Never  Vaping Use   Vaping Use: Never used  Substance and Sexual Activity   Alcohol  use: Yes    Comment: occasionally   Drug use: No   Sexual activity: Yes    Birth control/protection: None  Other Topics Concern   Not on file  Social History Narrative   4 children   Married                   Social Determinants of Health   Financial Resource Strain: Not on file  Food Insecurity: Not on file  Transportation Needs: Not on file  Physical Activity: Not on file  Stress: Not on file  Social Connections: Not on file     Family History: The patient's family history includes CVA in her maternal grandmother; Diabetes in her  mother; Hypertension in her mother.  ROS:   Please see the history of present illness.     All other systems reviewed and are negative.  EKGs/Labs/Other Studies Reviewed:    The following studies were reviewed today:  Echo 2018 Study Conclusions   - Left ventricle: The cavity size was normal. Wall thickness was    increased in a pattern of mild LVH. Systolic function was normal.    The estimated ejection fraction was in the range of 60% to 65%.  - Mitral valve: There was mild regurgitation.  - Right ventricle: The cavity size was normal. Wall thickness was    normal. Systolic function was normal.   Cardiac CT 04/2021 IMPRESSION: 1. Normal coronary calcium score of 0. Patient is low risk for coronary events.   2. Normal coronary origin with right dominance.   3. No evidence of CAD.   4. CAD-RADS 0. No evidence of CAD (0%). Consider non-atherosclerotic causes of chest pain.   Electronically Signed: By: Kate Sable M.D. On: 04/11/2021 14:03   EKG:  EKG is *** ordered today.  The ekg ordered today demonstrates ***  Recent Labs: 03/27/2021: ALT 29; BUN 6; Creatinine, Ser 0.57; Hemoglobin 12.2; Platelets 396; Potassium 4.3; Sodium 138; TSH 1.706  Recent Lipid Panel    Component Value Date/Time   CHOL 186 03/27/2021 1036   TRIG 139 03/27/2021 1036   HDL 57 03/27/2021 1036   CHOLHDL 3.3 03/27/2021 1036   VLDL 28 03/27/2021 1036   LDLCALC 101 (H) 03/27/2021 1036     Risk Assessment/Calculations:   {Does this patient have ATRIAL FIBRILLATION?:[id number]}   Physical Exam:    VS:  LMP 02/07/2019     Wt Readings from Last 3 Encounters:  03/27/21 190 lb (86.2 kg)  02/08/20 188 lb (85.3 kg)  06/06/19 191 lb (86.6 kg)     GEN: *** Well nourished, well developed in no acute distress HEENT: Normal NECK: No JVD; No carotid bruits LYMPHATICS: No lymphadenopathy CARDIAC: ***RRR, no murmurs, rubs, gallops RESPIRATORY:  Clear to auscultation without rales,  wheezing or rhonchi  ABDOMEN: Soft, non-tender, non-distended MUSCULOSKELETAL:  No edema; No deformity  SKIN: Warm and dry NEUROLOGIC:  Alert and oriented x 3 PSYCHIATRIC:  Normal affect   ASSESSMENT:    No diagnosis found. PLAN:    In order of problems listed above:  ***  Disposition: Follow up {follow up:15908} with ***   Shared Decision Making/Informed Consent   {Are you ordering a CV Procedure (e.g. stress test, cath, DCCV, TEE, etc)?   Press F2        :[id number]}    Signed, Farhiya Rosten Ninfa Meeker, PA-C  05/07/2021 3:22 PM    [facility]

## 2021-05-10 ENCOUNTER — Ambulatory Visit: Payer: 59 | Admitting: Medical

## 2021-07-29 ENCOUNTER — Other Ambulatory Visit: Payer: Self-pay | Admitting: Internal Medicine

## 2021-07-30 NOTE — Telephone Encounter (Signed)
Not on current med list, PCP not a provider in our practice.

## 2021-08-09 DIAGNOSIS — B379 Candidiasis, unspecified: Secondary | ICD-10-CM | POA: Diagnosis not present

## 2021-08-09 DIAGNOSIS — R7303 Prediabetes: Secondary | ICD-10-CM | POA: Diagnosis not present

## 2021-08-09 DIAGNOSIS — N898 Other specified noninflammatory disorders of vagina: Secondary | ICD-10-CM | POA: Diagnosis not present

## 2021-08-13 ENCOUNTER — Other Ambulatory Visit: Payer: Self-pay | Admitting: Internal Medicine

## 2021-08-14 NOTE — Telephone Encounter (Signed)
Requested medications are due for refill today.  unsure ? ?Requested medications are on the active medications list.  no ? ?Last refill. 03/16/2021 #12 Debra Whitney ? ?Future visit scheduled.   no ? ?Notes to clinic.  Medication not delegated. Pt has not been seen in over 1 year.  ? ? ? ?Requested Prescriptions  ?Pending Prescriptions Disp Refills  ? fluconazole (DIFLUCAN) 150 MG tablet [Pharmacy Med Name: FLUCONAZOLE 150 MG TABLET] 12 tablet 0  ?  Sig: Take 1 tablet (150 mg total) by mouth once for 1 dose. May repeat in 3 days.  ?  ? Off-Protocol Failed - 08/13/2021  5:56 PM  ?  ?  Failed - Medication not assigned to a protocol, review manually.  ?  ?  Failed - Valid encounter within last 12 months  ?  Recent Outpatient Visits   ?None ?  ?  ?Future Appointments   ? ?        ? In 4 months Brendolyn Patty, MD [place]  ? ?  ? ?  ?  ?  ?  ?

## 2021-10-08 ENCOUNTER — Other Ambulatory Visit: Payer: Self-pay | Admitting: Internal Medicine

## 2021-10-08 MED ORDER — POLYMYXIN B-TRIMETHOPRIM 10000-0.1 UNIT/ML-% OP SOLN: 1.0000 [drp] | OPHTHALMIC | 0 refills | Status: AC

## 2021-11-15 ENCOUNTER — Other Ambulatory Visit: Payer: Self-pay | Admitting: Internal Medicine

## 2021-11-15 MED ORDER — AMOXICILLIN 500 MG PO CAPS: 500.0000 mg | ORAL_CAPSULE | Freq: Three times a day (TID) | ORAL | 0 refills | Status: AC

## 2021-12-14 ENCOUNTER — Other Ambulatory Visit: Payer: Self-pay | Admitting: Internal Medicine

## 2021-12-16 NOTE — Telephone Encounter (Signed)
Requested medication (s) are due for refill today: no  Requested medication (s) are on the active medication list: no  Last refill:  02/16/21  Future visit scheduled: no  Notes to clinic: Unable to refill per protocol, Rx expired.     Requested Prescriptions  Pending Prescriptions Disp Refills   fluconazole (DIFLUCAN) 150 MG tablet [Pharmacy Med Name: FLUCONAZOLE 150 MG TABLET] 12 tablet 0    Sig: Take 1 tablet (150 mg total) by mouth once for 1 dose. May repeat in 3 days.     Off-Protocol Failed - 12/14/2021 12:41 PM      Failed - Medication not assigned to a protocol, review manually.      Failed - Valid encounter within last 12 months    Recent Outpatient Visits   None     Future Appointments             In 3 weeks Brendolyn Patty, MD [place]

## 2022-01-06 ENCOUNTER — Ambulatory Visit: Payer: 59 | Admitting: Dermatology

## 2022-01-06 DIAGNOSIS — D2272 Melanocytic nevi of left lower limb, including hip: Secondary | ICD-10-CM | POA: Diagnosis not present

## 2022-01-06 DIAGNOSIS — D2262 Melanocytic nevi of left upper limb, including shoulder: Secondary | ICD-10-CM | POA: Diagnosis not present

## 2022-01-06 DIAGNOSIS — D485 Neoplasm of uncertain behavior of skin: Secondary | ICD-10-CM | POA: Diagnosis not present

## 2022-01-06 DIAGNOSIS — L918 Other hypertrophic disorders of the skin: Secondary | ICD-10-CM | POA: Diagnosis not present

## 2022-01-06 DIAGNOSIS — D1801 Hemangioma of skin and subcutaneous tissue: Secondary | ICD-10-CM | POA: Diagnosis not present

## 2022-01-06 DIAGNOSIS — D229 Melanocytic nevi, unspecified: Secondary | ICD-10-CM | POA: Diagnosis not present

## 2022-01-06 DIAGNOSIS — L578 Other skin changes due to chronic exposure to nonionizing radiation: Secondary | ICD-10-CM | POA: Diagnosis not present

## 2022-01-06 DIAGNOSIS — D225 Melanocytic nevi of trunk: Secondary | ICD-10-CM | POA: Diagnosis not present

## 2022-01-06 NOTE — Progress Notes (Signed)
New Patient Visit  Subjective  Debra Whitney is a 36 y.o. female who presents for the following: Nevus (Patient here today to have a few irritated nevi checked at abdomen and groin. Patient does not have any skin cancer history. ).  She would like the spots removed.   The following portions of the chart were reviewed this encounter and updated as appropriate:       Review of Systems:  No other skin or systemic complaints except as noted in HPI or Assessment and Plan.  Objective  Well appearing patient in no apparent distress; mood and affect are within normal limits.  A full examination was performed including scalp, head, eyes, ears, nose, lips, neck, chest, axillae, abdomen, back, buttocks, bilateral upper extremities, bilateral lower extremities, hands, feet, fingers, toes, fingernails, and toenails. All findings within normal limits unless otherwise noted below.  Left Flank 0.4 cm red papule R/o Irritated Hemangioma  Right Inframammary 0.3 cm fleshy papule Irritated Skin Tag vs Irritated Nevus  Left Labia Majora 0.7 cm fleshy papule Irritated Nevus vs Irritated Skin Tag  Left Anterior Thigh 0.3 cm medium dark brown macule  Left Anterior Shoulder 0.5 cm medium brown macule  Left Medial Upper Thigh 0.4 cm speckled medium dark brown macule       Assessment & Plan  Neoplasm of uncertain behavior of skin (3) Left Flank  Epidermal / dermal shaving  Lesion diameter (cm):  0.4 Informed consent: discussed and consent obtained   Patient was prepped and draped in usual sterile fashion: area prepped with alcohol. Anesthesia: the lesion was anesthetized in a standard fashion   Anesthetic:  1% lidocaine w/ epinephrine 1-100,000 buffered w/ 8.4% NaHCO3 Instrument used: flexible razor blade   Hemostasis achieved with: pressure, aluminum chloride and electrodesiccation   Outcome: patient tolerated procedure well   Post-procedure details: wound care instructions  given   Post-procedure details comment:  Ointment and small bandage applied  Specimen 1 - Surgical pathology Differential Diagnosis: R/o Irritated Hemangioma  Check Margins: No 0.4 cm red papule   Right Inframammary  Epidermal / dermal shaving  Lesion diameter (cm):  0.3 Informed consent: discussed and consent obtained   Timeout: patient name, date of birth, surgical site, and procedure verified   Patient was prepped and draped in usual sterile fashion: area prepped with alcohol. Anesthesia: the lesion was anesthetized in a standard fashion   Anesthetic:  1% lidocaine w/ epinephrine 1-100,000 local infiltration Instrument used: scissors   Hemostasis achieved with: pressure, aluminum chloride and electrodesiccation   Outcome: patient tolerated procedure well   Post-procedure details: wound care instructions given   Post-procedure details comment:  Ointment and a small bandage applied  Specimen 2 - Surgical pathology Differential Diagnosis: Irritated Skin Tag vs Irritated Nevus  Check Margins: No 0.3 cm fleshy papule   Left Labia Majora  Epidermal / dermal shaving  Lesion diameter (cm):  0.7 Informed consent: discussed and consent obtained   Timeout: patient name, date of birth, surgical site, and procedure verified   Patient was prepped and draped in usual sterile fashion: area prepped with alcohol. Anesthesia: the lesion was anesthetized in a standard fashion   Anesthetic:  1% lidocaine w/ epinephrine 1-100,000 local infiltration Instrument used: scissors   Hemostasis achieved with: pressure, aluminum chloride and electrodesiccation   Outcome: patient tolerated procedure well   Post-procedure details: wound care instructions given   Post-procedure details comment:  Ointment and a small bandage applied  Specimen 3 - Surgical pathology Differential  Diagnosis: Irritated Nevus vs Irritated Skin Tag  Check Margins: No 0.7 cm fleshy papule   Symptomatic, irritating,  patient would like removed  Nevus (3) Left Anterior Shoulder; Left Anterior Thigh; Left Medial Upper Thigh  Benign-appearing.  Observation.  Call clinic for new or changing lesions.  Recommend daily use of broad spectrum spf 30+ sunscreen to sun-exposed areas.   Will recheck on f/up   Melanocytic Nevi - Tan-brown and/or pink-flesh-colored symmetric macules and papules - Benign appearing on exam today - Observation - Call clinic for new or changing moles - Recommend daily use of broad spectrum spf 30+ sunscreen to sun-exposed areas.   Actinic Damage - chronic, secondary to cumulative UV radiation exposure/sun exposure over time - diffuse scaly erythematous macules with underlying dyspigmentation - Recommend daily broad spectrum sunscreen SPF 30+ to sun-exposed areas, reapply every 2 hours as needed.  - Recommend staying in the shade or wearing long sleeves, sun glasses (UVA+UVB protection) and wide brim hats (4-inch brim around the entire circumference of the hat). - Call for new or changing lesions.  Return in about 4 months (around 05/08/2022) for recheck nevi.  Graciella Belton, RMA, am acting as scribe for Brendolyn Patty, MD .  Documentation: I have reviewed the above documentation for accuracy and completeness, and I agree with the above.  Brendolyn Patty MD

## 2022-01-06 NOTE — Patient Instructions (Addendum)
Wound Care Instructions  Cleanse wound gently with soap and water once a day then pat dry with clean gauze. Apply a thing coat of Petrolatum (petroleum jelly, "Vaseline") over the wound (unless you have an allergy to this). We recommend that you use a new, sterile tube of Vaseline. Do not pick or remove scabs. Do not remove the yellow or white "healing tissue" from the base of the wound.  Cover the wound with fresh, clean, nonstick gauze and secure with paper tape. You may use Band-Aids in place of gauze and tape if the would is small enough, but would recommend trimming much of the tape off as there is often too much. Sometimes Band-Aids can irritate the skin.  You should call the office for your biopsy report after 1 week if you have not already been contacted.  If you experience any problems, such as abnormal amounts of bleeding, swelling, significant bruising, significant pain, or evidence of infection, please call the office immediately.  FOR ADULT SURGERY PATIENTS: If you need something for pain relief you may take 1 extra strength Tylenol (acetaminophen) AND 2 Ibuprofen ('200mg'$  each) together every 4 hours as needed for pain. (do not take these if you are allergic to them or if you have a reason you should not take them.) Typically, you may only need pain medication for 1 to 3 days.   Melanoma ABCDEs  Melanoma is the most dangerous type of skin cancer, and is the leading cause of death from skin disease.  You are more likely to develop melanoma if you: Have light-colored skin, light-colored eyes, or red or blond hair Spend a lot of time in the sun Tan regularly, either outdoors or in a tanning bed Have had blistering sunburns, especially during childhood Have a close family member who has had a melanoma Have atypical moles or large birthmarks  Early detection of melanoma is key since treatment is typically straightforward and cure rates are extremely high if we catch it early.   The  first sign of melanoma is often a change in a mole or a new dark spot.  The ABCDE system is a way of remembering the signs of melanoma.  A for asymmetry:  The two halves do not match. B for border:  The edges of the growth are irregular. C for color:  A mixture of colors are present instead of an even brown color. D for diameter:  Melanomas are usually (but not always) greater than 47m - the size of a pencil eraser. E for evolution:  The spot keeps changing in size, shape, and color.  Please check your skin once per month between visits. You can use a small mirror in front and a large mirror behind you to keep an eye on the back side or your body.   If you see any new or changing lesions before your next follow-up, please call to schedule a visit.  Please continue daily skin protection including broad spectrum sunscreen SPF 30+ to sun-exposed areas, reapplying every 2 hours as needed when you're outdoors.    Due to recent changes in healthcare laws, you may see results of your pathology and/or laboratory studies on MyChart before the doctors have had a chance to review them. We understand that in some cases there may be results that are confusing or concerning to you. Please understand that not all results are received at the same time and often the doctors may need to interpret multiple results in order to provide you  with the best plan of care or course of treatment. Therefore, we ask that you please give us 2 business days to thoroughly review all your results before contacting the office for clarification. Should we see a critical lab result, you will be contacted sooner.   If You Need Anything After Your Visit  If you have any questions or concerns for your doctor, please call our main line at [phone] and press option 4 to reach your doctor's medical assistant. If no one answers, please leave a voicemail as directed and we will return your call as soon as possible. Messages left after 4  pm will be answered the following business day.   You may also send us a message via MyChart. We typically respond to MyChart messages within 1-2 business days.  For prescription refills, please ask your pharmacy to contact our office. Our fax number is [phone].  If you have an urgent issue when the clinic is closed that cannot wait until the next business day, you can page your doctor at the number below.    Please note that while we do our best to be available for urgent issues outside of office hours, we are not available 24/7.   If you have an urgent issue and are unable to reach us, you may choose to seek medical care at your doctor's office, retail clinic, urgent care center, or emergency room.  If you have a medical emergency, please immediately call 911 or go to the emergency department.  Pager Numbers  - Dr. Kowalski: [phone]  - Dr. Moye: [phone]  - Dr. Stewart: [phone]  In the event of inclement weather, please call our main line at [phone] for an update on the status of any delays or closures.  Dermatology Medication Tips: Please keep the boxes that topical medications come in in order to help keep track of the instructions about where and how to use these. Pharmacies typically print the medication instructions only on the boxes and not directly on the medication tubes.   If your medication is too expensive, please contact our office at [phone] option 4 or send us a message through MyChart.   We are unable to tell what your co-pay for medications will be in advance as this is different depending on your insurance coverage. However, we may be able to find a substitute medication at lower cost or fill out paperwork to get insurance to cover a needed medication.   If a prior authorization is required to get your medication covered by your insurance company, please allow us 1-2 business days to complete this process.  Drug prices often vary  depending on where the prescription is filled and some pharmacies may offer cheaper prices.  The website www.goodrx.com contains coupons for medications through different pharmacies. The prices here do not account for what the cost may be with help from insurance (it may be cheaper with your insurance), but the website can give you the price if you did not use any insurance.  - You can print the associated coupon and take it with your prescription to the pharmacy.  - You may also stop by our office during regular business hours and pick up a GoodRx coupon card.  - If you need your prescription sent electronically to a different pharmacy, notify our office through [facility] MyChart or by phone at [phone] option 4.     Si Usted Necesita Algo Despus de Su Visita  Tambin puede enviarnos un mensaje a travs   de MyChart. Por lo general respondemos a los mensajes de MyChart en el transcurso de 1 a 2 das hbiles.  Para renovar recetas, por favor pida a su farmacia que se ponga en contacto con nuestra oficina. Nuestro nmero de fax es el [phone].  Si tiene un asunto urgente cuando la clnica est cerrada y que no puede esperar hasta el siguiente da hbil, puede llamar/localizar a su doctor(a) al nmero que aparece a continuacin.   Por favor, tenga en cuenta que aunque hacemos todo lo posible para estar disponibles para asuntos urgentes fuera del horario de oficina, no estamos disponibles las 24 horas del da, los 7 das de la semana.   Si tiene un problema urgente y no puede comunicarse con nosotros, puede optar por buscar atencin mdica  en el consultorio de su doctor(a), en una clnica privada, en un centro de atencin urgente o en una sala de emergencias.  Si tiene una emergencia mdica, por favor llame inmediatamente al 911 o vaya a la sala de emergencias.  Nmeros de bper  - Dr. Kowalski: [phone]  - Dra. Moye: [phone]  - Dra. Stewart: [phone]  En caso de  inclemencias del tiempo, por favor llame a nuestra lnea principal al [phone] para una actualizacin sobre el estado de cualquier retraso o cierre.  Consejos para la medicacin en dermatologa: Por favor, guarde las cajas en las que vienen los medicamentos de uso tpico para ayudarle a seguir las instrucciones sobre dnde y cmo usarlos. Las farmacias generalmente imprimen las instrucciones del medicamento slo en las cajas y no directamente en los tubos del medicamento.   Si su medicamento es muy caro, por favor, pngase en contacto con nuestra oficina llamando al [phone] y presione la opcin 4 o envenos un mensaje a travs de MyChart.   No podemos decirle cul ser su copago por los medicamentos por adelantado ya que esto es diferente dependiendo de la cobertura de su seguro. Sin embargo, es posible que podamos encontrar un medicamento sustituto a menor costo o llenar un formulario para que el seguro cubra el medicamento que se considera necesario.   Si se requiere una autorizacin previa para que su compaa de seguros cubra su medicamento, por favor permtanos de 1 a 2 das hbiles para completar este proceso.  Los precios de los medicamentos varan con frecuencia dependiendo del lugar de dnde se surte la receta y alguna farmacias pueden ofrecer precios ms baratos.  El sitio web www.goodrx.com tiene cupones para medicamentos de diferentes farmacias. Los precios aqu no tienen en cuenta lo que podra costar con la ayuda del seguro (puede ser ms barato con su seguro), pero el sitio web puede darle el precio si no utiliz ningn seguro.  - Puede imprimir el cupn correspondiente y llevarlo con su receta a la farmacia.  - Tambin puede pasar por nuestra oficina durante el horario de atencin regular y recoger una tarjeta de cupones de GoodRx.  - Si necesita que su receta se enve electrnicamente a una farmacia diferente, informe a nuestra oficina a travs de MyChart de  o  por telfono llamando al [phone] y presione la opcin 4.  

## 2022-01-13 ENCOUNTER — Telehealth: Payer: Self-pay

## 2022-01-13 NOTE — Telephone Encounter (Signed)
Patient advised all 3 biopsies were benign and no further treatment needed.

## 2022-01-13 NOTE — Telephone Encounter (Signed)
-----   Message from Brendolyn Patty, MD sent at 01/13/2022  9:47 AM EDT ----- 1. Skin , left flank ARTERIOVENOUS HEMANGIOMA, BASE INVOLVED 2. Skin , right inframammary [place] 3. Skin , left labia majora MELANOCYTIC NEVUS, INTRADERMAL TYPE  1. Benign hemangioma, may recur 2. Benign skin tag 3. Benign mole   - please call patient

## 2022-02-06 ENCOUNTER — Other Ambulatory Visit: Payer: Self-pay | Admitting: Internal Medicine

## 2022-02-06 MED ORDER — ONDANSETRON 4 MG PO TBDP: 4.0000 mg | ORAL_TABLET | Freq: Three times a day (TID) | ORAL | 0 refills | Status: DC | PRN

## 2022-02-26 ENCOUNTER — Other Ambulatory Visit: Payer: Self-pay | Admitting: Internal Medicine

## 2022-02-26 NOTE — Telephone Encounter (Signed)
Requested medication (s) are due for refill today - provider review   Requested medication (s) are on the active medication list -yes  Future visit scheduled -no  Last refill: 02/06/22 #30  Notes to clinic: non delegated Rx  Requested Prescriptions  Pending Prescriptions Disp Refills   ondansetron (ZOFRAN-ODT) 4 MG disintegrating tablet [Pharmacy Med Name: ONDANSETRON ODT 4 MG TABLET] 18 tablet 1    Sig: TAKE 1 TABLET BY MOUTH EVERY 8 HOURS AS NEEDED FOR NAUSEA AND VOMITING     Not Delegated - Gastroenterology: Antiemetics - ondansetron Failed - 02/26/2022  8:47 AM      Failed - This refill cannot be delegated      Failed - Valid encounter within last 6 months    Recent Outpatient Visits   None     Future Appointments             In 1 month Brendolyn Patty, MD [place] - AST in normal range and within 360 days    AST  Date Value Ref Range Status  03/27/2021 24 15 - 41 U/L Final   SGOT(AST)  Date Value Ref Range Status  03/19/2014 10 (L) 15 - 37 Unit/L Final         Passed - ALT in normal range and within 360 days    ALT  Date Value Ref Range Status  03/27/2021 29 0 - 44 U/L Final   SGPT (ALT)  Date Value Ref Range Status  03/19/2014 16 U/L Final    Comment:    14-63 NOTE: New Reference Range 12/27/13             Requested Prescriptions  Pending Prescriptions Disp Refills   ondansetron (ZOFRAN-ODT) 4 MG disintegrating tablet [Pharmacy Med Name: ONDANSETRON ODT 4 MG TABLET] 18 tablet 1    Sig: TAKE 1 TABLET BY MOUTH EVERY 8 HOURS AS NEEDED FOR NAUSEA AND VOMITING     Not Delegated - Gastroenterology: Antiemetics - ondansetron Failed - 02/26/2022  8:47 AM      Failed - This refill cannot be delegated      Failed - Valid encounter within last 6 months    Recent Outpatient Visits   None     Future Appointments             In 1 month Brendolyn Patty, MD [place] - AST in normal range  and within 360 days    AST  Date Value Ref Range Status  03/27/2021 24 15 - 41 U/L Final   SGOT(AST)  Date Value Ref Range Status  03/19/2014 10 (L) 15 - 37 Unit/L Final         Passed - ALT in normal range and within 360 days    ALT  Date Value Ref Range Status  03/27/2021 29 0 - 44 U/L Final   SGPT (ALT)  Date Value Ref Range Status  03/19/2014 16 U/L Final    Comment:    14-63 NOTE: New Reference Range 12/27/13

## 2022-03-14 DIAGNOSIS — I10 Essential (primary) hypertension: Secondary | ICD-10-CM | POA: Diagnosis not present

## 2022-03-14 DIAGNOSIS — B3731 Acute candidiasis of vulva and vagina: Secondary | ICD-10-CM | POA: Diagnosis not present

## 2022-03-14 DIAGNOSIS — R300 Dysuria: Secondary | ICD-10-CM | POA: Diagnosis not present

## 2022-03-14 DIAGNOSIS — N898 Other specified noninflammatory disorders of vagina: Secondary | ICD-10-CM | POA: Diagnosis not present

## 2022-03-14 DIAGNOSIS — F419 Anxiety disorder, unspecified: Secondary | ICD-10-CM | POA: Diagnosis not present

## 2022-03-14 DIAGNOSIS — E782 Mixed hyperlipidemia: Secondary | ICD-10-CM | POA: Diagnosis not present

## 2022-03-14 DIAGNOSIS — R8290 Unspecified abnormal findings in urine: Secondary | ICD-10-CM | POA: Diagnosis not present

## 2022-03-14 DIAGNOSIS — R7303 Prediabetes: Secondary | ICD-10-CM | POA: Diagnosis not present

## 2022-03-14 DIAGNOSIS — F411 Generalized anxiety disorder: Secondary | ICD-10-CM | POA: Diagnosis not present

## 2022-03-14 DIAGNOSIS — F5105 Insomnia due to other mental disorder: Secondary | ICD-10-CM | POA: Diagnosis not present

## 2022-03-26 ENCOUNTER — Other Ambulatory Visit: Payer: Self-pay | Admitting: Internal Medicine

## 2022-03-26 MED ORDER — SCOPOLAMINE 1 MG/3DAYS TD PT72: 1.0000 | MEDICATED_PATCH | TRANSDERMAL | 0 refills | Status: DC

## 2022-03-31 DIAGNOSIS — E782 Mixed hyperlipidemia: Secondary | ICD-10-CM | POA: Diagnosis not present

## 2022-03-31 DIAGNOSIS — I10 Essential (primary) hypertension: Secondary | ICD-10-CM | POA: Diagnosis not present

## 2022-03-31 DIAGNOSIS — R7303 Prediabetes: Secondary | ICD-10-CM | POA: Diagnosis not present

## 2022-04-07 DIAGNOSIS — E782 Mixed hyperlipidemia: Secondary | ICD-10-CM | POA: Diagnosis not present

## 2022-04-07 DIAGNOSIS — I480 Paroxysmal atrial fibrillation: Secondary | ICD-10-CM | POA: Diagnosis not present

## 2022-04-07 DIAGNOSIS — J309 Allergic rhinitis, unspecified: Secondary | ICD-10-CM | POA: Diagnosis not present

## 2022-04-07 DIAGNOSIS — K449 Diaphragmatic hernia without obstruction or gangrene: Secondary | ICD-10-CM | POA: Diagnosis not present

## 2022-04-07 DIAGNOSIS — Z1331 Encounter for screening for depression: Secondary | ICD-10-CM | POA: Diagnosis not present

## 2022-04-07 DIAGNOSIS — K219 Gastro-esophageal reflux disease without esophagitis: Secondary | ICD-10-CM | POA: Diagnosis not present

## 2022-04-07 DIAGNOSIS — F411 Generalized anxiety disorder: Secondary | ICD-10-CM | POA: Diagnosis not present

## 2022-04-07 DIAGNOSIS — Z0000 Encounter for general adult medical examination without abnormal findings: Secondary | ICD-10-CM | POA: Diagnosis not present

## 2022-04-07 DIAGNOSIS — Z87898 Personal history of other specified conditions: Secondary | ICD-10-CM | POA: Diagnosis not present

## 2022-04-07 DIAGNOSIS — R7303 Prediabetes: Secondary | ICD-10-CM | POA: Diagnosis not present

## 2022-04-07 DIAGNOSIS — F419 Anxiety disorder, unspecified: Secondary | ICD-10-CM | POA: Diagnosis not present

## 2022-04-07 DIAGNOSIS — I10 Essential (primary) hypertension: Secondary | ICD-10-CM | POA: Diagnosis not present

## 2022-04-15 ENCOUNTER — Ambulatory Visit: Payer: 59 | Admitting: Dermatology

## 2022-04-15 DIAGNOSIS — D229 Melanocytic nevi, unspecified: Secondary | ICD-10-CM

## 2022-04-15 DIAGNOSIS — D2272 Melanocytic nevi of left lower limb, including hip: Secondary | ICD-10-CM | POA: Diagnosis not present

## 2022-04-15 DIAGNOSIS — D2271 Melanocytic nevi of right lower limb, including hip: Secondary | ICD-10-CM

## 2022-04-15 DIAGNOSIS — D224 Melanocytic nevi of scalp and neck: Secondary | ICD-10-CM | POA: Diagnosis not present

## 2022-04-15 DIAGNOSIS — L245 Irritant contact dermatitis due to other chemical products: Secondary | ICD-10-CM | POA: Diagnosis not present

## 2022-04-15 DIAGNOSIS — D489 Neoplasm of uncertain behavior, unspecified: Secondary | ICD-10-CM

## 2022-04-15 DIAGNOSIS — D2262 Melanocytic nevi of left upper limb, including shoulder: Secondary | ICD-10-CM | POA: Diagnosis not present

## 2022-04-15 HISTORY — DX: Melanocytic nevi, unspecified: D22.9

## 2022-04-15 MED ORDER — ALCLOMETASONE DIPROPIONATE 0.05 % EX OINT
TOPICAL_OINTMENT | CUTANEOUS | 0 refills | Status: AC
Start: 2022-04-15 — End: ?

## 2022-04-15 NOTE — Progress Notes (Signed)
Follow-Up Visit   Subjective  Debra Whitney is a 36 y.o. female who presents for the following: Follow-up (Patient would like to have spot to look in scalp, and 1 spot right lower abdomen area in crease which gets irritated. Also here for 4 month recheck on some moles measured at last appointment.) and Skin Problem (Patient concerned with hx of swelling and irritation at lower and upper eyelids after eyelash extensions applied last Thursday. Patient would like eyelids checked and would like to know if any recommendations for treatment. Still some rough scaly areas at eyelids. ). Patient reports hx of eye sensitivity to eye makeup and mascara in past.   The following portions of the chart were reviewed this encounter and updated as appropriate:      Review of Systems: No other skin or systemic complaints except as noted in HPI or Assessment and Plan.   Objective  Well appearing patient in no apparent distress; mood and affect are within normal limits.  A focused examination was performed including scalp, right inguinal area, left upper medial thigh, left anterior thigh, left posterior shoulder and bilateral eyelids. Relevant physical exam findings are noted in the Assessment and Plan.  b/l eyelids Periocular erythema scale including upper and lower eyelids   left medial upper thigh 5 x 3 mm two toned macule      right inguinal crease 3 mm brown papule, darker center     left anterior shoulder 5 x 4 mm medium brown macule with slight irregular border   posterior pariatal scalp 4 mm flesh papule   left anterior upper thigh 0.3 cm medium dark brown macule    Assessment & Plan  Irritant contact dermatitis due to other chemical products b/l eyelids  Possibly due to adhesive, Recent hx of flare at b/l lower and upper eyelids after eyelash extensions applied.   Start aclovate 0.05 % ointment - apply topically to affected areas at eyes bid for 2 weeks prn. If improving  can use once daily until clear. If clear d/c. If still flared after 2 weeks continue to use once daily for up to 2 more weeks.   Discussed patch testing to further determine irritants causing reaction to eyes.  Avoid eyelash extensions   Order placed and patient will scheduled to come back in a week for patch test    alclomethasone (ACLOVATE) 0.05 % ointment - b/l eyelids Apply topically to affected area at eyes bid for 2 weeks prn. If improving can use once daily or if clear d/c.  Patch Test - b/l eyelids  Neoplasm of uncertain behavior (2) left medial upper thigh  Epidermal / dermal shaving  Lesion diameter (cm):  0.6 Informed consent: discussed and consent obtained   Patient was prepped and draped in usual sterile fashion: Area prepped with alcohol. Anesthesia: the lesion was anesthetized in a standard fashion   Anesthetic:  1% lidocaine w/ epinephrine 1-100,000 buffered w/ 8.4% NaHCO3 Instrument used: flexible razor blade   Hemostasis achieved with: pressure, aluminum chloride and electrodesiccation   Outcome: patient tolerated procedure well   Post-procedure details: wound care instructions given   Post-procedure details comment:  Ointment and small bandage applied.   Specimen 1 - Surgical pathology Differential Diagnosis: nevus r/o dysplasia   Check Margins: No  right inguinal crease  Epidermal / dermal shaving  Lesion diameter (cm):  0.3 Informed consent: discussed and consent obtained   Patient was prepped and draped in usual sterile fashion: Area prepped with alcohol. Anesthesia:  the lesion was anesthetized in a standard fashion   Anesthetic:  1% lidocaine w/ epinephrine 1-100,000 buffered w/ 8.4% NaHCO3 Instrument used: flexible razor blade   Hemostasis achieved with: pressure, aluminum chloride and electrodesiccation   Outcome: patient tolerated procedure well   Post-procedure details: wound care instructions given   Post-procedure details comment:  Ointment  and small bandage applied.   Specimen 2 - Surgical pathology Differential Diagnosis: irritated nevus vs other  Check Margins: No  Irritated nevus vs other for right inguinal crease  Nevus r/o dysplasia at left medial upper thigh   Nevus (3) left anterior shoulder; left anterior upper thigh; posterior pariatal scalp  Benign-appearing. Stable compared to previous visit. Observation.  Call clinic for new or changing moles.  Recommend daily use of broad spectrum spf 30+ sunscreen to sun-exposed areas.     Return in about 1 year (around 04/16/2023) for TBSE. I, Ruthell Rummage, CMA, am acting as scribe for Brendolyn Patty, MD.  Documentation: I have reviewed the above documentation for accuracy and completeness, and I agree with the above.  Brendolyn Patty MD

## 2022-04-15 NOTE — Patient Instructions (Addendum)
For dermatitis at eyes   Start aclovate 0.05 % ointment - apply topically to affected areas at eyes twice daily for 2 weeks. If improving can continue using once daily until clear. If clear can discontinue   Biopsy Wound Care Instructions  Leave the original bandage on for 24 hours if possible.  If the bandage becomes soaked or soiled before that time, it is OK to remove it and examine the wound.  A small amount of post-operative bleeding is normal.  If excessive bleeding occurs, remove the bandage, place gauze over the site and apply continuous pressure (no peeking) over the area for 30 minutes. If this does not work, please call our clinic as soon as possible or page your doctor if it is after hours.   Once a day, cleanse the wound with soap and water. It is fine to shower. If a thick crust develops you may use a Q-tip dipped into dilute hydrogen peroxide (mix 1:1 with water) to dissolve it.  Hydrogen peroxide can slow the healing process, so use it only as needed.    After washing, apply petroleum jelly (Vaseline) or an antibiotic ointment if your doctor prescribed one for you, followed by a bandage.    For best healing, the wound should be covered with a layer of ointment at all times. If you are not able to keep the area covered with a bandage to hold the ointment in place, this may mean re-applying the ointment several times a day.  Continue this wound care until the wound has healed and is no longer open.   Itching and mild discomfort is normal during the healing process. However, if you develop pain or severe itching, please call our office.   If you have any discomfort, you can take Tylenol (acetaminophen) or ibuprofen as directed on the bottle. (Please do not take these if you have an allergy to them or cannot take them for another reason).  Some redness, tenderness and white or yellow material in the wound is normal healing.  If the area becomes very sore and red, or develops a thick  yellow-green material (pus), it may be infected; please notify us.    If you have stitches, return to clinic as directed to have the stitches removed. You will continue wound care for 2-3 days after the stitches are removed.   Wound healing continues for up to one year following surgery. It is not unusual to experience pain in the scar from time to time during the interval.  If the pain becomes severe or the scar thickens, you should notify the office.    A slight amount of redness in a scar is expected for the first six months.  After six months, the redness will fade and the scar will soften and fade.  The color difference becomes less noticeable with time.  If there are any problems, return for a post-op surgery check at your earliest convenience.  To improve the appearance of the scar, you can use silicone scar gel, cream, or sheets (such as Mederma or Serica) every night for up to one year. These are available over the counter (without a prescription).  Please call our office at [phone] for any questions or concerns.           Due to recent changes in healthcare laws, you may see results of your pathology and/or laboratory studies on MyChart before the doctors have had a chance to review them. We understand that in some cases there  may be results that are confusing or concerning to you. Please understand that not all results are received at the same time and often the doctors may need to interpret multiple results in order to provide you with the best plan of care or course of treatment. Therefore, we ask that you please give [place] 2 business days to thoroughly review all your results before contacting the office for clarification. Should we see a critical lab result, you will be contacted sooner.   If You Need Anything After Your Visit  If you have any questions or concerns for your doctor, please call our main line at [phone] and press option 4 to reach your doctor's medical  assistant. If no one answers, please leave a voicemail as directed and we will return your call as soon as possible. Messages left after 4 pm will be answered the following business day.   You may also send [place] a message via [place]. We typically respond to MyChart messages within 1-2 business days.  For prescription refills, please ask your pharmacy to contact our office. Our fax number is [phone].  If you have an urgent issue when the clinic is closed that cannot wait until the next business day, you can page your doctor at the number below.    Please note that while we do our best to be available for urgent issues outside of office hours, we are not available 24/7.   If you have an urgent issue and are unable to reach [place], you may choose to seek medical care at your doctor's office, retail clinic, urgent care center, or emergency room.  If you have a medical emergency, please immediately call 911 or go to the emergency department.  Pager Numbers  - Dr. Nehemiah Massed: [phone]  - Dr. Laurence Ferrari: [phone]  - Dr. Nicole Kindred: [phone]  In the event of inclement weather, please call our main line at [phone] for an update on the status of any delays or closures.  Dermatology Medication Tips: Please keep the boxes that topical medications come in in order to help keep track of the instructions about where and how to use these. Pharmacies typically print the medication instructions only on the boxes and not directly on the medication tubes.   If your medication is too expensive, please contact our office at [phone] option 4 or send [place] a message through [place].   We are unable to tell what your co-pay for medications will be in advance as this is different depending on your insurance coverage. However, we may be able to find a substitute medication at lower cost or fill out paperwork to get insurance to cover a needed medication.   If a prior authorization is required to get your  medication covered by your insurance company, please allow [place] 1-2 business days to complete this process.  Drug prices often vary depending on where the prescription is filled and some pharmacies may offer cheaper prices.  The website www.goodrx.com contains coupons for medications through different pharmacies. The prices here do not account for what the cost may be with help from insurance (it may be cheaper with your insurance), but the website can give you the price if you did not use any insurance.  - You can print the associated coupon and take it with your prescription to the pharmacy.  - You may also stop by our office during regular business hours and pick up a GoodRx coupon card.  - If you need your prescription sent electronically to  a different pharmacy, notify our office through [facility] or by phone at [phone] option 4.     Si Usted Necesita Algo Despus de Su Visita  Tambin puede enviarnos un mensaje a travs de Pharmacist, community. Por lo general respondemos a los mensajes de MyChart en el transcurso de 1 a 2 das hbiles.  Para renovar recetas, por favor pida a su farmacia que se ponga en contacto con nuestra oficina. Harland Dingwall de fax es [place] [phone].  Si tiene un asunto urgente cuando la clnica est cerrada y que no puede esperar hasta el siguiente da hbil, puede llamar/localizar a su doctor(a) al nmero que aparece a continuacin.   Por favor, tenga en cuenta que aunque hacemos todo lo posible para estar disponibles para asuntos urgentes fuera del horario de [place], no estamos disponibles las 24 horas del da, los 7 das de la [place].   Si tiene un problema urgente y no puede comunicarse con nosotros, puede optar por buscar atencin mdica  en el consultorio de su doctor(a), en una clnica privada, en un centro de atencin urgente o en una sala de emergencias.  Si tiene Engineering geologist, por favor llame inmediatamente al 911 o vaya a la sala de  emergencias.  Nmeros de bper  - Dr. Nehemiah Massed: [phone]  - Dra. Moye: [phone]  - Dra. Nicole Kindred: [phone]  En caso de inclemencias del [place], por favor llame a Johnsie Kindred principal al [phone] para una actualizacin sobre el [place] de cualquier retraso o cierre.  Consejos para la medicacin en dermatologa: Por favor, guarde las cajas en las que vienen los medicamentos de uso tpico para ayudarle a seguir las instrucciones sobre dnde y cmo usarlos. Las farmacias generalmente imprimen las instrucciones del medicamento slo en las cajas y no directamente en los tubos del [place].   Si su medicamento es muy caro, por favor, pngase en contacto con Zigmund Daniel llamando al [phone] y presione la opcin 4 o envenos un mensaje a travs de Pharmacist, community.   No podemos decirle cul ser su copago por los medicamentos por adelantado ya que esto es diferente dependiendo de la cobertura de su seguro. Sin embargo, es posible que podamos encontrar un medicamento sustituto a Electrical engineer un formulario para que el seguro cubra el medicamento que se considera necesario.   Si se requiere una autorizacin previa para que su compaa de seguros [place] su medicamento, por favor permtanos de 1 a 2 das hbiles para completar este proceso.  Los precios de los medicamentos varan con frecuencia dependiendo del Environmental consultant de dnde se surte la receta y alguna farmacias pueden ofrecer precios ms baratos.  El sitio web www.goodrx.com tiene cupones para medicamentos de Airline pilot. Los precios aqu no tienen en cuenta lo que podra costar con la ayuda del seguro (puede ser ms barato con su seguro), pero el sitio web puede darle el precio si no utiliz Research scientist (physical sciences).  - Puede imprimir el cupn correspondiente y llevarlo con su receta a la farmacia.  - Tambin puede pasar por nuestra oficina durante el horario de atencin regular y Charity fundraiser una tarjeta de cupones de GoodRx.  - Si  necesita que su receta se enve electrnicamente a una farmacia diferente, informe a nuestra oficina a travs de MyChart de [place] o por telfono llamando al [phone] y presione la opcin 4.

## 2022-04-18 ENCOUNTER — Encounter: Payer: Self-pay | Admitting: Dermatology

## 2022-04-21 ENCOUNTER — Ambulatory Visit: Payer: 59 | Admitting: Dermatology

## 2022-04-21 ENCOUNTER — Ambulatory Visit: Payer: 59

## 2022-04-21 ENCOUNTER — Telehealth: Payer: Self-pay

## 2022-04-21 NOTE — Telephone Encounter (Signed)
Patient advised of biopsy results. Follow-up scheduled 10/26/21 to recheck dysplastic nevus of the left medial upper thigh.

## 2022-04-21 NOTE — Telephone Encounter (Signed)
-----   Message from Brendolyn Patty, MD sent at 04/21/2022 11:13 AM EST ----- 1. Skin , left medial upper thigh DYSPLASTIC COMPOUND NEVUS WITH MILD ATYPIA, LIMITED MARGINS FREE 2. Skin , right inguinal crease MELANOCYTIC NEVUS, INTRADERMAL TYPE, IRRITATED  1. Mildly atypical mole, observation 2. Irritated benign mole   - please call patient

## 2022-04-21 NOTE — Telephone Encounter (Signed)
Left pt msg to call for bx results/sh 

## 2022-04-24 ENCOUNTER — Other Ambulatory Visit: Payer: Self-pay | Admitting: Internal Medicine

## 2022-04-24 MED ORDER — FLUCONAZOLE 150 MG PO TABS: 150.0000 mg | ORAL_TABLET | Freq: Once | ORAL | 0 refills | Status: AC

## 2022-04-28 ENCOUNTER — Ambulatory Visit: Payer: 59 | Admitting: Dermatology

## 2022-05-22 ENCOUNTER — Other Ambulatory Visit: Payer: Self-pay | Admitting: Internal Medicine

## 2022-05-22 MED ORDER — FLUCONAZOLE 150 MG PO TABS: 150.0000 mg | ORAL_TABLET | Freq: Once | ORAL | 0 refills | Status: AC

## 2022-05-28 DIAGNOSIS — R8290 Unspecified abnormal findings in urine: Secondary | ICD-10-CM | POA: Diagnosis not present

## 2022-05-28 DIAGNOSIS — R300 Dysuria: Secondary | ICD-10-CM | POA: Diagnosis not present

## 2022-07-01 DIAGNOSIS — J0100 Acute maxillary sinusitis, unspecified: Secondary | ICD-10-CM | POA: Diagnosis not present

## 2022-08-04 DIAGNOSIS — H6993 Unspecified Eustachian tube disorder, bilateral: Secondary | ICD-10-CM | POA: Diagnosis not present

## 2022-08-07 ENCOUNTER — Other Ambulatory Visit: Payer: Self-pay | Admitting: Internal Medicine

## 2022-08-07 MED ORDER — FLUCONAZOLE 150 MG PO TABS: 150.0000 mg | ORAL_TABLET | Freq: Once | ORAL | 0 refills | Status: AC

## 2022-09-24 ENCOUNTER — Other Ambulatory Visit: Payer: Self-pay | Admitting: Internal Medicine

## 2022-09-24 MED ORDER — FLUCONAZOLE 150 MG PO TABS: ORAL_TABLET | ORAL | 0 refills | Status: DC

## 2022-10-27 ENCOUNTER — Ambulatory Visit: Payer: 59 | Admitting: Dermatology

## 2022-10-27 VITALS — BP 121/85 | HR 96

## 2022-10-27 DIAGNOSIS — D2239 Melanocytic nevi of other parts of face: Secondary | ICD-10-CM

## 2022-10-27 DIAGNOSIS — Z86018 Personal history of other benign neoplasm: Secondary | ICD-10-CM | POA: Diagnosis not present

## 2022-10-27 DIAGNOSIS — D229 Melanocytic nevi, unspecified: Secondary | ICD-10-CM | POA: Diagnosis not present

## 2022-10-27 DIAGNOSIS — D485 Neoplasm of uncertain behavior of skin: Secondary | ICD-10-CM

## 2022-10-27 NOTE — Patient Instructions (Addendum)
Wound Care Instructions  Cleanse wound gently with soap and water once a day then pat dry with clean gauze. Apply a thin coat of Petrolatum (petroleum jelly, "Vaseline") over the wound (unless you have an allergy to this). We recommend that you use a new, sterile tube of Vaseline. Do not pick or remove scabs. Do not remove the yellow or white "healing tissue" from the base of the wound.  Cover the wound with fresh, clean, nonstick gauze and secure with paper tape. You may use Band-Aids in place of gauze and tape if the wound is small enough, but would recommend trimming much of the tape off as there is often too much. Sometimes Band-Aids can irritate the skin.  You should call the office for your biopsy report after 1 week if you have not already been contacted.  If you experience any problems, such as abnormal amounts of bleeding, swelling, significant bruising, significant pain, or evidence of infection, please call the office immediately.  FOR ADULT SURGERY PATIENTS: If you need something for pain relief you may take 1 extra strength Tylenol (acetaminophen) AND 2 Ibuprofen (200mg each) together every 4 hours as needed for pain. (do not take these if you are allergic to them or if you have a reason you should not take them.) Typically, you may only need pain medication for 1 to 3 days.     Melanoma ABCDEs  Melanoma is the most dangerous type of skin cancer, and is the leading cause of death from skin disease.  You are more likely to develop melanoma if you: Have light-colored skin, light-colored eyes, or red or blond hair Spend a lot of time in the sun Tan regularly, either outdoors or in a tanning bed Have had blistering sunburns, especially during childhood Have a close family member who has had a melanoma Have atypical moles or large birthmarks  Early detection of melanoma is key since treatment is typically straightforward and cure rates are extremely high if we catch it early.   The  first sign of melanoma is often a change in a mole or a new dark spot.  The ABCDE system is a way of remembering the signs of melanoma.  A for asymmetry:  The two halves do not match. B for border:  The edges of the growth are irregular. C for color:  A mixture of colors are present instead of an even brown color. D for diameter:  Melanomas are usually (but not always) greater than 6mm - the size of a pencil eraser. E for evolution:  The spot keeps changing in size, shape, and color.  Please check your skin once per month between visits. You can use a small mirror in front and a large mirror behind you to keep an eye on the back side or your body.   If you see any new or changing lesions before your next follow-up, please call to schedule a visit.  Please continue daily skin protection including broad spectrum sunscreen SPF 30+ to sun-exposed areas, reapplying every 2 hours as needed when you're outdoors.   Staying in the shade or wearing long sleeves, sun glasses (UVA+UVB protection) and wide brim hats (4-inch brim around the entire circumference of the hat) are also recommended for sun protection.    Due to recent changes in healthcare laws, you may see results of your pathology and/or laboratory studies on MyChart before the doctors have had a chance to review them. We understand that in some cases there may be   results that are confusing or concerning to you. Please understand that not all results are received at the same time and often the doctors may need to interpret multiple results in order to provide you with the best plan of care or course of treatment. Therefore, we ask that you please give us 2 business days to thoroughly review all your results before contacting the office for clarification. Should we see a critical lab result, you will be contacted sooner.   If You Need Anything After Your Visit  If you have any questions or concerns for your doctor, please call our main line at  [phone] and press option 4 to reach your doctor's medical assistant. If no one answers, please leave a voicemail as directed and we will return your call as soon as possible. Messages left after 4 pm will be answered the following business day.   You may also send us a message via MyChart. We typically respond to MyChart messages within 1-2 business days.  For prescription refills, please ask your pharmacy to contact our office. Our fax number is [phone].  If you have an urgent issue when the clinic is closed that cannot wait until the next business day, you can page your doctor at the number below.    Please note that while we do our best to be available for urgent issues outside of office hours, we are not available 24/7.   If you have an urgent issue and are unable to reach us, you may choose to seek medical care at your doctor's office, retail clinic, urgent care center, or emergency room.  If you have a medical emergency, please immediately call 911 or go to the emergency department.  Pager Numbers  - Dr. Kowalski: [phone]  - Dr. Moye: [phone]  - Dr. Stewart: [phone]  In the event of inclement weather, please call our main line at [phone] for an update on the status of any delays or closures.  Dermatology Medication Tips: Please keep the boxes that topical medications come in in order to help keep track of the instructions about where and how to use these. Pharmacies typically print the medication instructions only on the boxes and not directly on the medication tubes.   If your medication is too expensive, please contact our office at [phone] option 4 or send us a message through MyChart.   We are unable to tell what your co-pay for medications will be in advance as this is different depending on your insurance coverage. However, we may be able to find a substitute medication at lower cost or fill out paperwork to get insurance to cover a needed  medication.   If a prior authorization is required to get your medication covered by your insurance company, please allow us 1-2 business days to complete this process.  Drug prices often vary depending on where the prescription is filled and some pharmacies may offer cheaper prices.  The website www.goodrx.com contains coupons for medications through different pharmacies. The prices here do not account for what the cost may be with help from insurance (it may be cheaper with your insurance), but the website can give you the price if you did not use any insurance.  - You can print the associated coupon and take it with your prescription to the pharmacy.  - You may also stop by our office during regular business hours and pick up a GoodRx coupon card.  - If you need your prescription sent electronically to a different   pharmacy, notify our office through [place] MyChart or by phone at [phone] option 4.     Si Usted Necesita Algo Despus de Su Visita  Tambin puede enviarnos un mensaje a travs de MyChart. Por lo general respondemos a los mensajes de MyChart en el transcurso de 1 a 2 das hbiles.  Para renovar recetas, por favor pida a su farmacia que se ponga en contacto con nuestra oficina. Nuestro nmero de fax es el [phone].  Si tiene un asunto urgente cuando la clnica est cerrada y que no puede esperar hasta el siguiente da hbil, puede llamar/localizar a su doctor(a) al nmero que aparece a continuacin.   Por favor, tenga en cuenta que aunque hacemos todo lo posible para estar disponibles para asuntos urgentes fuera del horario de oficina, no estamos disponibles las 24 horas del da, los 7 das de la semana.   Si tiene un problema urgente y no puede comunicarse con nosotros, puede optar por buscar atencin mdica  en el consultorio de su doctor(a), en una clnica privada, en un centro de atencin urgente o en una sala de emergencias.  Si tiene una emergencia mdica,  por favor llame inmediatamente al 911 o vaya a la sala de emergencias.  Nmeros de bper  - Dr. Kowalski: [phone]  - Dra. Moye: [phone]  - Dra. Stewart: [phone]  En caso de inclemencias del tiempo, por favor llame a nuestra lnea principal al [phone] para una actualizacin sobre el estado de cualquier retraso o cierre.  Consejos para la medicacin en dermatologa: Por favor, guarde las cajas en las que vienen los medicamentos de uso tpico para ayudarle a seguir las instrucciones sobre dnde y cmo usarlos. Las farmacias generalmente imprimen las instrucciones del medicamento slo en las cajas y no directamente en los tubos del medicamento.   Si su medicamento es muy caro, por favor, pngase en contacto con nuestra oficina llamando al [phone] y presione la opcin 4 o envenos un mensaje a travs de MyChart.   No podemos decirle cul ser su copago por los medicamentos por adelantado ya que esto es diferente dependiendo de la cobertura de su seguro. Sin embargo, es posible que podamos encontrar un medicamento sustituto a menor costo o llenar un formulario para que el seguro cubra el medicamento que se considera necesario.   Si se requiere una autorizacin previa para que su compaa de seguros cubra su medicamento, por favor permtanos de 1 a 2 das hbiles para completar este proceso.  Los precios de los medicamentos varan con frecuencia dependiendo del lugar de dnde se surte la receta y alguna farmacias pueden ofrecer precios ms baratos.  El sitio web www.goodrx.com tiene cupones para medicamentos de diferentes farmacias. Los precios aqu no tienen en cuenta lo que podra costar con la ayuda del seguro (puede ser ms barato con su seguro), pero el sitio web puede darle el precio si no utiliz ningn seguro.  - Puede imprimir el cupn correspondiente y llevarlo con su receta a la farmacia.  - Tambin puede pasar por nuestra oficina durante el horario de atencin  regular y recoger una tarjeta de cupones de GoodRx.  - Si necesita que su receta se enve electrnicamente a una farmacia diferente, informe a nuestra oficina a travs de MyChart de [place] o por telfono llamando al [phone] y presione la opcin 4.  

## 2022-10-27 NOTE — Progress Notes (Signed)
Follow-Up Visit   Subjective  Debra Whitney is a 37 y.o. female who presents for the following: follow-up dysplastic nevus of the left medial upper thigh. She also has a couple of moles on her face that she wants to discuss getting removed, one is growing and both are a little sensitive when touched.   The following portions of the chart were reviewed this encounter and updated as appropriate: medications, allergies, medical history  Review of Systems:  No other skin or systemic complaints except as noted in HPI or Assessment and Plan.  Objective  Well appearing patient in no apparent distress; mood and affect are within normal limits.  A focused examination was performed of the following areas: Face, left leg  Relevant physical exam findings are noted in the Assessment and Plan.  Left Lateral to Nasal Alar Crease 4mm flesh papule      Right Paranasal 4mm flesh brown papule       Assessment & Plan   Neoplasm of uncertain behavior of skin (2) Left Lateral to Nasal Alar Crease  Epidermal / dermal shaving  Lesion diameter (cm):  0.4 Informed consent: discussed and consent obtained   Patient was prepped and draped in usual sterile fashion: Area prepped with alcohol. Anesthesia: the lesion was anesthetized in a standard fashion   Anesthetic:  1% lidocaine w/ epinephrine 1-100,000 buffered w/ 8.4% NaHCO3 Instrument used: flexible razor blade   Hemostasis achieved with: pressure, aluminum chloride and electrodesiccation   Outcome: patient tolerated procedure well   Post-procedure details: wound care instructions given   Post-procedure details comment:  Ointment and small bandage applied  Specimen 1 - Surgical pathology Differential Diagnosis: Irritated Nevus vs other Check Margins: No  Right Paranasal  Epidermal / dermal shaving  Lesion diameter (cm):  0.4 Informed consent: discussed and consent obtained   Patient was prepped and draped in usual sterile  fashion: Area prepped with alcohol. Anesthesia: the lesion was anesthetized in a standard fashion   Anesthetic:  1% lidocaine w/ epinephrine 1-100,000 buffered w/ 8.4% NaHCO3 Instrument used: flexible razor blade   Hemostasis achieved with: pressure, aluminum chloride and electrodesiccation   Outcome: patient tolerated procedure well   Post-procedure details: wound care instructions given   Post-procedure details comment:  Ointment and small bandage applied  Specimen 2 - Surgical pathology Differential Diagnosis: Irritated Nevus vs other Check Margins: No  Symptomatic, irritating, patient would like removed  Discussed resulting small scar with shave removal, and possible recurrence of lesion.  Recommend vaseline ointment to area daily and cover until healed.  Recommend photoprotection/sunscreen to area to prevent discoloration of scar.  Once healed, may apply OTC Serica scar gel bid to thickened scars.   Nevus  History of dysplastic nevus  History of Dysplastic Nevus Left medial upper thigh. - No evidence of recurrence today - Recommend regular full body skin exams - Recommend daily broad spectrum sunscreen SPF 30+ to sun-exposed areas, reapply every 2 hours as needed.  - Call if any new or changing lesions are noted between office visits  MELANOCYTIC NEVI Exam: Tan-brown and/or pink-flesh-colored symmetric macules and papules  Treatment Plan: Benign appearing on exam today. Recommend observation. Call clinic for new or changing moles. Recommend daily use of broad spectrum spf 30+ sunscreen to sun-exposed areas.    Return if symptoms worsen or fail to improve.  Wendee Beavers, CMA, am acting as scribe for Willeen Niece, MD .   Documentation: I have reviewed the above documentation for accuracy and  completeness, and I agree with the above.  Willeen Niece, MD

## 2022-11-04 ENCOUNTER — Telehealth: Payer: Self-pay

## 2022-11-04 NOTE — Telephone Encounter (Signed)
Advised patient biopsies of the left lateral to nasal alar crease and right paranasal were both benign nevi and no further treatment needed.

## 2022-11-04 NOTE — Telephone Encounter (Signed)
-----   Message from Willeen Niece, MD sent at 11/04/2022  1:02 PM EDT ----- 1. Skin , left lateral to nasal alar crease MELANOCYTIC NEVUS, INTRADERMAL TYPE, BASE INVOLVED 2. Skin , right paranasal MELANOCYTIC NEVUS, INTRADERMAL TYPE, BASE INVOLVED  1 and 2. Benign moles - please call patient

## 2022-12-08 ENCOUNTER — Other Ambulatory Visit: Payer: Self-pay | Admitting: Internal Medicine

## 2022-12-08 MED ORDER — FLUCONAZOLE 150 MG PO TABS: ORAL_TABLET | ORAL | 0 refills | Status: DC

## 2022-12-17 DIAGNOSIS — M545 Low back pain, unspecified: Secondary | ICD-10-CM | POA: Diagnosis not present

## 2023-02-05 ENCOUNTER — Ambulatory Visit: Payer: 59 | Admitting: Dermatology

## 2023-02-05 ENCOUNTER — Encounter: Payer: Self-pay | Admitting: Dermatology

## 2023-02-05 VITALS — BP 127/87 | HR 85

## 2023-02-05 DIAGNOSIS — D485 Neoplasm of uncertain behavior of skin: Secondary | ICD-10-CM

## 2023-02-05 DIAGNOSIS — L821 Other seborrheic keratosis: Secondary | ICD-10-CM | POA: Diagnosis not present

## 2023-02-05 DIAGNOSIS — B079 Viral wart, unspecified: Secondary | ICD-10-CM

## 2023-02-05 NOTE — Patient Instructions (Addendum)

## 2023-02-05 NOTE — Progress Notes (Signed)
   Follow-Up Visit   Subjective  Debra Whitney is a 37 y.o. female who presents for the following: Irregular skin lesion on R post thigh, growing larger in size, cuts when she shaves, occasionally itchy. Patient concerned and would like removed today.   The patient has spots, moles and lesions to be evaluated, some may be new or changing and the patient may have concern these could be cancer.   The following portions of the chart were reviewed this encounter and updated as appropriate: medications, allergies, medical history  Review of Systems:  No other skin or systemic complaints except as noted in HPI or Assessment and Plan.  Objective  Well appearing patient in no apparent distress; mood and affect are within normal limits.   A focused examination was performed of the following areas: the face and legs   Relevant exam findings are noted in the Assessment and Plan.  R post thigh 0.5 cm skin-colored cerebriform papule       Assessment & Plan     Neoplasm of uncertain behavior of skin R post thigh  Epidermal / dermal shaving  Lesion diameter (cm):  0.5 Informed consent: discussed and consent obtained   Timeout: patient name, date of birth, surgical site, and procedure verified   Procedure prep:  Patient was prepped and draped in usual sterile fashion Prep type:  Isopropyl alcohol Anesthesia: the lesion was anesthetized in a standard fashion   Anesthetic:  1% lidocaine w/ epinephrine 1-100,000 buffered w/ 8.4% NaHCO3 Instrument used: flexible razor blade   Hemostasis achieved with: pressure, aluminum chloride and electrodesiccation   Outcome: patient tolerated procedure well   Post-procedure details: sterile dressing applied and wound care instructions given   Dressing type: bandage and petrolatum    Specimen 1 - Surgical pathology Differential Diagnosis: R/O ISK  Check Margins: No  Seborrheic keratoses  Clinically consistent with seborrheic keratosis.  Goal was to remove lesion given symptoms  Return for appointment as scheduled.  Maylene Roes, CMA, am acting as scribe for Elie Goody, MD .  Documentation: I have reviewed the above documentation for accuracy and completeness, and I agree with the above.  Elie Goody, MD

## 2023-03-02 DIAGNOSIS — F331 Major depressive disorder, recurrent, moderate: Secondary | ICD-10-CM | POA: Diagnosis not present

## 2023-03-12 DIAGNOSIS — F331 Major depressive disorder, recurrent, moderate: Secondary | ICD-10-CM | POA: Diagnosis not present

## 2023-03-16 DIAGNOSIS — F331 Major depressive disorder, recurrent, moderate: Secondary | ICD-10-CM | POA: Diagnosis not present

## 2023-03-23 DIAGNOSIS — F331 Major depressive disorder, recurrent, moderate: Secondary | ICD-10-CM | POA: Diagnosis not present

## 2023-03-30 DIAGNOSIS — F331 Major depressive disorder, recurrent, moderate: Secondary | ICD-10-CM | POA: Diagnosis not present

## 2023-04-09 DIAGNOSIS — F419 Anxiety disorder, unspecified: Secondary | ICD-10-CM | POA: Diagnosis not present

## 2023-04-09 DIAGNOSIS — I480 Paroxysmal atrial fibrillation: Secondary | ICD-10-CM | POA: Diagnosis not present

## 2023-04-09 DIAGNOSIS — F40243 Fear of flying: Secondary | ICD-10-CM | POA: Diagnosis not present

## 2023-04-09 DIAGNOSIS — I10 Essential (primary) hypertension: Secondary | ICD-10-CM | POA: Diagnosis not present

## 2023-04-09 DIAGNOSIS — K449 Diaphragmatic hernia without obstruction or gangrene: Secondary | ICD-10-CM | POA: Diagnosis not present

## 2023-04-09 DIAGNOSIS — K219 Gastro-esophageal reflux disease without esophagitis: Secondary | ICD-10-CM | POA: Diagnosis not present

## 2023-04-09 DIAGNOSIS — R7303 Prediabetes: Secondary | ICD-10-CM | POA: Diagnosis not present

## 2023-04-09 DIAGNOSIS — E66811 Obesity, class 1: Secondary | ICD-10-CM | POA: Diagnosis not present

## 2023-04-09 DIAGNOSIS — F411 Generalized anxiety disorder: Secondary | ICD-10-CM | POA: Diagnosis not present

## 2023-04-09 DIAGNOSIS — E782 Mixed hyperlipidemia: Secondary | ICD-10-CM | POA: Diagnosis not present

## 2023-04-09 DIAGNOSIS — Z0000 Encounter for general adult medical examination without abnormal findings: Secondary | ICD-10-CM | POA: Diagnosis not present

## 2023-04-09 DIAGNOSIS — Z1331 Encounter for screening for depression: Secondary | ICD-10-CM | POA: Diagnosis not present

## 2023-04-09 DIAGNOSIS — J309 Allergic rhinitis, unspecified: Secondary | ICD-10-CM | POA: Diagnosis not present

## 2023-04-13 DIAGNOSIS — E782 Mixed hyperlipidemia: Secondary | ICD-10-CM | POA: Diagnosis not present

## 2023-04-13 DIAGNOSIS — I10 Essential (primary) hypertension: Secondary | ICD-10-CM | POA: Diagnosis not present

## 2023-04-13 DIAGNOSIS — R7303 Prediabetes: Secondary | ICD-10-CM | POA: Diagnosis not present

## 2023-04-15 DIAGNOSIS — J029 Acute pharyngitis, unspecified: Secondary | ICD-10-CM | POA: Diagnosis not present

## 2023-04-15 DIAGNOSIS — J309 Allergic rhinitis, unspecified: Secondary | ICD-10-CM | POA: Diagnosis not present

## 2023-04-15 DIAGNOSIS — F331 Major depressive disorder, recurrent, moderate: Secondary | ICD-10-CM | POA: Diagnosis not present

## 2023-04-22 DIAGNOSIS — F331 Major depressive disorder, recurrent, moderate: Secondary | ICD-10-CM | POA: Diagnosis not present

## 2023-04-29 DIAGNOSIS — F331 Major depressive disorder, recurrent, moderate: Secondary | ICD-10-CM | POA: Diagnosis not present

## 2023-05-18 ENCOUNTER — Ambulatory Visit: Payer: 59 | Admitting: Dermatology

## 2023-05-18 DIAGNOSIS — D224 Melanocytic nevi of scalp and neck: Secondary | ICD-10-CM

## 2023-05-18 DIAGNOSIS — D492 Neoplasm of unspecified behavior of bone, soft tissue, and skin: Secondary | ICD-10-CM

## 2023-05-18 DIAGNOSIS — Z1283 Encounter for screening for malignant neoplasm of skin: Secondary | ICD-10-CM

## 2023-05-18 DIAGNOSIS — Z86018 Personal history of other benign neoplasm: Secondary | ICD-10-CM

## 2023-05-18 DIAGNOSIS — L821 Other seborrheic keratosis: Secondary | ICD-10-CM | POA: Diagnosis not present

## 2023-05-18 DIAGNOSIS — L720 Epidermal cyst: Secondary | ICD-10-CM

## 2023-05-18 DIAGNOSIS — L219 Seborrheic dermatitis, unspecified: Secondary | ICD-10-CM

## 2023-05-18 DIAGNOSIS — W908XXA Exposure to other nonionizing radiation, initial encounter: Secondary | ICD-10-CM | POA: Diagnosis not present

## 2023-05-18 DIAGNOSIS — L814 Other melanin hyperpigmentation: Secondary | ICD-10-CM

## 2023-05-18 DIAGNOSIS — D1801 Hemangioma of skin and subcutaneous tissue: Secondary | ICD-10-CM

## 2023-05-18 DIAGNOSIS — D229 Melanocytic nevi, unspecified: Secondary | ICD-10-CM | POA: Diagnosis not present

## 2023-05-18 DIAGNOSIS — L719 Rosacea, unspecified: Secondary | ICD-10-CM | POA: Diagnosis not present

## 2023-05-18 DIAGNOSIS — D225 Melanocytic nevi of trunk: Secondary | ICD-10-CM | POA: Diagnosis not present

## 2023-05-18 DIAGNOSIS — L578 Other skin changes due to chronic exposure to nonionizing radiation: Secondary | ICD-10-CM

## 2023-05-18 MED ORDER — DOXYCYCLINE HYCLATE 20 MG PO TABS: 20.0000 mg | ORAL_TABLET | Freq: Two times a day (BID) | ORAL | 4 refills | Status: DC

## 2023-05-18 MED ORDER — FINACEA 15 % EX FOAM: 1.0000 | Freq: Two times a day (BID) | CUTANEOUS | 6 refills | Status: DC

## 2023-05-18 NOTE — Patient Instructions (Addendum)
For Scalp Recommend H&S shampoo with zinc,2-3x/wk let sit several minutes before rinsing   Recommend OTC Scalpicin Extra Strength solution for spot treating to affected areas of scalp as needed for itch.    Wound Care Instructions  Cleanse wound gently with soap and water once a day then pat dry with clean gauze. Apply a thin coat of Petrolatum (petroleum jelly, "Vaseline") over the wound (unless you have an allergy to this). We recommend that you use a new, sterile tube of Vaseline. Do not pick or remove scabs. Do not remove the yellow or white "healing tissue" from the base of the wound.  Cover the wound with fresh, clean, nonstick gauze and secure with paper tape. You may use Band-Aids in place of gauze and tape if the wound is small enough, but would recommend trimming much of the tape off as there is often too much. Sometimes Band-Aids can irritate the skin.  You should call the office for your biopsy report after 1 week if you have not already been contacted.  If you experience any problems, such as abnormal amounts of bleeding, swelling, significant bruising, significant pain, or evidence of infection, please call the office immediately.  FOR ADULT SURGERY PATIENTS: If you need something for pain relief you may take 1 extra strength Tylenol (acetaminophen) AND 2 Ibuprofen (200mg  each) together every 4 hours as needed for pain. (do not take these if you are allergic to them or if you have a reason you should not take them.) Typically, you may only need pain medication for 1 to 3 days.   Melanoma ABCDEs  Melanoma is the most dangerous type of skin cancer, and is the leading cause of death from skin disease.  You are more likely to develop melanoma if you: Have light-colored skin, light-colored eyes, or red or blond hair Spend a lot of time in the sun Tan regularly, either outdoors or in a tanning bed Have had blistering sunburns, especially during childhood Have a close family member who  has had a melanoma Have atypical moles or large birthmarks  Early detection of melanoma is key since treatment is typically straightforward and cure rates are extremely high if we catch it early.   The first sign of melanoma is often a change in a mole or a new dark spot.  The ABCDE system is a way of remembering the signs of melanoma.  A for asymmetry:  The two halves do not match. B for border:  The edges of the growth are irregular. C for color:  A mixture of colors are present instead of an even brown color. D for diameter:  Melanomas are usually (but not always) greater than 6mm - the size of a pencil eraser. E for evolution:  The spot keeps changing in size, shape, and color.  Please check your skin once per month between visits. You can use a small mirror in front and a large mirror behind you to keep an eye on the back side or your body.   If you see any new or changing lesions before your next follow-up, please call to schedule a visit.  Please continue daily skin protection including broad spectrum sunscreen SPF 30+ to sun-exposed areas, reapplying every 2 hours as needed when you're outdoors.   Staying in the shade or wearing long sleeves, sun glasses (UVA+UVB protection) and wide brim hats (4-inch brim around the entire circumference of the hat) are also recommended for sun protection.    Due to recent changes  in healthcare laws, you may see results of your pathology and/or laboratory studies on MyChart before the doctors have had a chance to review them. We understand that in some cases there may be results that are confusing or concerning to you. Please understand that not all results are received at the same time and often the doctors may need to interpret multiple results in order to provide you with the best plan of care or course of treatment. Therefore, we ask that you please give [place] 2 business days to thoroughly review all your results before contacting the office for  clarification. Should we see a critical lab result, you will be contacted sooner.   If You Need Anything After Your Visit  If you have any questions or concerns for your doctor, please call our main line at [phone] and press option 4 to reach your doctor's medical assistant. If no one answers, please leave a voicemail as directed and we will return your call as soon as possible. Messages left after 4 pm will be answered the following business day.   You may also send [place] a message via MyChart. We typically respond to MyChart messages within 1-2 business days.  For prescription refills, please ask your pharmacy to contact our office. Our fax number is [phone].  If you have an urgent issue when the clinic is closed that cannot wait until the next business day, you can page your doctor at the number below.    Please note that while we do our best to be available for urgent issues outside of office hours, we are not available 24/7.   If you have an urgent issue and are unable to reach [place], you may choose to seek medical care at your doctor's office, retail clinic, urgent care center, or emergency room.  If you have a medical emergency, please immediately call 911 or go to the emergency department.  Pager Numbers  - Dr. Gwen Pounds: [phone]  - Dr. Roseanne Reno: [phone]  - Dr. Katrinka Blazing: [phone]   In the event of inclement weather, please call our main line at [phone] for an update on the status of any delays or closures.  Dermatology Medication Tips: Please keep the boxes that topical medications come in in order to help keep track of the instructions about where and how to use these. Pharmacies typically print the medication instructions only on the boxes and not directly on the medication tubes.   If your medication is too expensive, please contact our office at [phone] option 4 or send [place] a message through MyChart.   We are unable to tell what your co-pay for  medications will be in advance as this is different depending on your insurance coverage. However, we may be able to find a substitute medication at lower cost or fill out paperwork to get insurance to cover a needed medication.   If a prior authorization is required to get your medication covered by your insurance company, please allow [place] 1-2 business days to complete this process.  Drug prices often vary depending on where the prescription is filled and some pharmacies may offer cheaper prices.  The website www.goodrx.com contains coupons for medications through different pharmacies. The prices here do not account for what the cost may be with help from insurance (it may be cheaper with your insurance), but the website can give you the price if you did not use any insurance.  - You can print the associated coupon and take it with your prescription  to the pharmacy.  - You may also stop by our office during regular business hours and pick up a GoodRx coupon card.  - If you need your prescription sent electronically to a different pharmacy, notify our office through [facility] or by phone at [phone] option 4.     Si Usted Necesita Algo Despus de Su Visita  Tambin puede enviarnos un mensaje a travs de Clinical cytogeneticist. Por lo general respondemos a los mensajes de MyChart en el transcurso de 1 a 2 das hbiles.  Para renovar recetas, por favor pida a su farmacia que se ponga en contacto con nuestra oficina. Annie Sable de fax es [place] [phone].  Si tiene un asunto urgente cuando la clnica est cerrada y que no puede esperar hasta el siguiente da hbil, puede llamar/localizar a su doctor(a) al nmero que aparece a continuacin.   Por favor, tenga en cuenta que aunque hacemos todo lo posible para estar disponibles para asuntos urgentes fuera del horario de [place], no estamos disponibles las 24 horas del da, los 7 [address]  [address] de la [place].   Si tiene un problema urgente y no puede  comunicarse con nosotros, puede optar por buscar atencin mdica  en el consultorio de su doctor(a), en una clnica privada, en un centro de atencin urgente o en una sala de emergencias.  Si tiene Engineer, drilling, por favor llame inmediatamente al 911 o vaya a la sala de emergencias.  Nmeros de bper  - Dr. Gwen Pounds: [phone]  - Dra. Roseanne Reno: [phone]  - Dr. Katrinka Blazing: [phone]   En caso de inclemencias del tiempo, por favor llame a Lacy Duverney principal al [phone] para una actualizacin sobre el [place] de cualquier retraso o cierre.  Consejos para la medicacin en dermatologa: Por favor, guarde las cajas en las que vienen los medicamentos de uso tpico para ayudarle a seguir las instrucciones sobre dnde y cmo usarlos. Las farmacias generalmente imprimen las instrucciones del medicamento slo en las cajas y no directamente en los tubos del [place].   Si su medicamento es muy caro, por favor, pngase en contacto con Rolm [place]ala llamando al [phone] y presione la opcin 4 o envenos un mensaje a travs de Clinical cytogeneticist.   No podemos decirle cul ser su copago por los medicamentos por adelantado ya que esto es diferente dependiendo de la cobertura de su seguro. Sin embargo, es posible que podamos encontrar un medicamento sustituto a Audiological scientist un formulario para que el seguro cubra el medicamento que se considera necesario.   Si se requiere una autorizacin previa para que su compaa de seguros [place] su medicamento, por favor permtanos de 1 a 2 das hbiles para completar [address]  Los precios de los medicamentos varan con frecuencia dependiendo del Environmental consultant de dnde se surte la receta y alguna farmacias pueden ofrecer precios ms baratos.  El sitio web www.goodrx.com tiene cupones para medicamentos de Health and safety inspector. Los precios aqu no tienen en cuenta lo que podra costar con la ayuda del seguro (puede ser ms barato con su seguro), pero  el sitio web puede darle el precio si no utiliz Tourist information centre manager.  - Puede imprimir el cupn correspondiente y llevarlo con su receta a la farmacia.  - Tambin puede pasar por nuestra oficina durante el horario de atencin regular y Education officer, museum una tarjeta de cupones de GoodRx.  - Si necesita que su receta se enve electrnicamente a Psychiatrist, informe a nuestra oficina a travs de MyChart de [employer] o  por telfono llamando al [phone] y presione la opcin 4.

## 2023-05-18 NOTE — Progress Notes (Signed)
Follow-Up Visit   Subjective  Debra Whitney is a 37 y.o. female who presents for the following: Skin Cancer Screening and Full Body Skin Exam breaking out chin 3 wks, pt had started a new face wash and then d/c ~4 days ago, check scalp, getting flaky and has had a few scabs, pt has a few areas she would like removed R shoulder, L post neck x 2, irritating   The patient presents for Total-Body Skin Exam (TBSE) for skin cancer screening and mole check. The patient has spots, moles and lesions to be evaluated, some may be new or changing and the patient may have concern these could be cancer.    The following portions of the chart were reviewed this encounter and updated as appropriate: medications, allergies, medical history  Review of Systems:  No other skin or systemic complaints except as noted in HPI or Assessment and Plan.  Objective  Well appearing patient in no apparent distress; mood and affect are within normal limits.  A full examination was performed including scalp, head, eyes, ears, nose, lips, neck, chest, axillae, abdomen, back, buttocks, bilateral upper extremities, bilateral lower extremities, hands, feet, fingers, toes, fingernails, and toenails. All findings within normal limits unless otherwise noted below.   Relevant physical exam findings are noted in the Assessment and Plan.  L post neck 4.63mm firm flesh pap     L upper chest 4.23mm brown pap     R upper arm Firm white papule with open comedone, 3 mm    Assessment & Plan   SKIN CANCER SCREENING PERFORMED TODAY.  ACTINIC DAMAGE - Chronic condition, secondary to cumulative UV/sun exposure - diffuse scaly erythematous macules with underlying dyspigmentation - Recommend daily broad spectrum sunscreen SPF 30+ to sun-exposed areas, reapply every 2 hours as needed.  - Staying in the shade or wearing long sleeves, sun glasses (UVA+UVB protection) and wide brim hats (4-inch brim around the entire  circumference of the hat) are also recommended for sun protection.  - Call for new or changing lesions.  LENTIGINES, SEBORRHEIC KERATOSES, HEMANGIOMAS - Benign normal skin lesions - Benign-appearing - Call for any changes  MELANOCYTIC NEVI - Tan-brown and/or pink-flesh-colored symmetric macules and papules - Benign appearing on exam today - Observation - Call clinic for new or changing moles - Recommend daily use of broad spectrum spf 30+ sunscreen to sun-exposed areas.  - L upper chest 4.72mm brown pap  HISTORY OF DYSPLASTIC NEVUS No evidence of recurrence today- L medial upper thigh Recommend regular full body skin exams Recommend daily broad spectrum sunscreen SPF 30+ to sun-exposed areas, reapply every 2 hours as needed.  Call if any new or changing lesions are noted between office visits    ROSACEA face Exam Mid face erythema with telangiectasias scattered inflammatory papules chin  Chronic and persistent condition with duration or expected duration over one year. Condition is bothersome/symptomatic for patient. Currently flared.   Rosacea is a chronic progressive skin condition usually affecting the face of adults, causing redness and/or acne bumps. It is treatable but not curable. It sometimes affects the eyes (ocular rosacea) as well. It may respond to topical and/or systemic medication and can flare with stress, sun exposure, alcohol, exercise, topical steroids (including hydrocortisone/cortisone 10) and some foods.  Daily application of broad spectrum spf 30+ sunscreen to face is recommended to reduce flares.  Patient denies grittiness of the eyes  Treatment Plan Start Doxycyline 20mg  2 po qd with food and drink Start Finacea foam  bid to face Recommend spf qam  Doxycycline should be taken with food to prevent nausea. Do not lay down for 30 minutes after taking. Be cautious with sun exposure and use good sun protection while on this medication. Pregnant women should not  take this medication.    SEBORRHEIC DERMATITIS Scalp  Exam: mild erythema and scale scalp  Chronic and persistent condition with duration or expected duration over one year. Condition is symptomatic/ bothersome to patient. Not currently at goal.   Seborrheic Dermatitis is a chronic persistent rash characterized by pinkness and scaling most commonly of the mid face but also can occur on the scalp (dandruff), ears; mid chest, mid back and groin.  It tends to be exacerbated by stress and cooler weather.  People who have neurologic disease may experience new onset or exacerbation of existing seborrheic dermatitis.  The condition is not curable but treatable and can be controlled.  Treatment Plan: Recommend H&S shampoo with zinc,2-3x/wk let sit several minutes before rinsing  Recommend OTC Scalpicin Extra Strength solution for spot treating to affected areas of scalp as needed for itch.   Neoplasm of skin (2) L post neck  Epidermal / dermal shaving  Lesion diameter (cm):  0.4 Informed consent: discussed and consent obtained   Patient was prepped and draped in usual sterile fashion: area prepped with alcohol. Anesthesia: the lesion was anesthetized in a standard fashion   Anesthetic:  1% lidocaine w/ epinephrine 1-100,000 buffered w/ 8.4% NaHCO3 Instrument used: flexible razor blade   Hemostasis achieved with: pressure, aluminum chloride and electrodesiccation   Outcome: patient tolerated procedure well   Post-procedure details: wound care instructions given   Post-procedure details comment:  Ointment and small bandage applied  Specimen 1 - Surgical pathology Differential Diagnosis: D48.5 Irritated Nevus r/o Atypia  Check Margins: yes 4.73mm firm flesh pap  L upper chest  Epidermal / dermal shaving  Lesion diameter (cm):  0.4 Informed consent: discussed and consent obtained   Patient was prepped and draped in usual sterile fashion: area prepped with alcohol. Anesthesia: the lesion  was anesthetized in a standard fashion   Anesthetic:  1% lidocaine w/ epinephrine 1-100,000 buffered w/ 8.4% NaHCO3 Instrument used: flexible razor blade   Hemostasis achieved with: pressure, aluminum chloride and electrodesiccation   Outcome: patient tolerated procedure well   Post-procedure details: wound care instructions given   Post-procedure details comment:  Ointment and small bandage applied  Specimen 2 - Surgical pathology Differential Diagnosis: D48.5 Irritated Nevus r/o Atypia  Check Margins: yes 4.14mm brown pap  Epidermoid cyst R upper arm  Symptomatic, irritating, patient would like treated.  Discussed cyst may recur after extraction, and if so, may require surgical excision   Acne/Milia surgery - R upper arm Procedure risks and benefits were discussed with the patient and verbal consent was obtained. Following prep of the skin on the R upper arm with an alcohol swab, and local anesthesia injection, extraction of cyst was performed with a comedone extractor following superficial incision made over their surfaces with a #11 surgical blade. Capillary hemostasis was achieved with 20% aluminum chloride solution. Vaseline ointment was applied to each site. The patient tolerated the procedure well.   Return in about 1 year (around 05/17/2024) for TBSE, Hx of Dysplastic nevi.  I, Ardis Rowan, RMA, am acting as scribe for Willeen Niece, MD .   Documentation: I have reviewed the above documentation for accuracy and completeness, and I agree with the above.  Willeen Niece, MD

## 2023-05-20 ENCOUNTER — Other Ambulatory Visit: Payer: Self-pay | Admitting: Internal Medicine

## 2023-05-20 MED ORDER — AMOXICILLIN-POT CLAVULANATE 875-125 MG PO TABS: 1.0000 | ORAL_TABLET | Freq: Two times a day (BID) | ORAL | 0 refills | Status: AC

## 2023-05-21 ENCOUNTER — Other Ambulatory Visit: Payer: Self-pay | Admitting: Internal Medicine

## 2023-05-21 LAB — SURGICAL PATHOLOGY

## 2023-05-21 MED ORDER — PREDNISONE 10 MG PO TABS: ORAL_TABLET | ORAL | 0 refills | Status: AC

## 2023-05-25 ENCOUNTER — Telehealth: Payer: Self-pay

## 2023-05-25 NOTE — Telephone Encounter (Signed)
-----   Message from Willeen Niece sent at 05/25/2023  8:44 AM EST ----- 1. Skin, left post neck :       MELANOCYTIC NEVUS, INTRADERMAL TYPE, BASE INVOLVED   2. Skin, left upper chest :       MELANOCYTIC NEVUS, INTRADERMAL TYPE, BASE INVOLVED   Benign moles. May recur - please call patient

## 2023-05-25 NOTE — Telephone Encounter (Signed)
Advised patient both biopsies were benign nevi and no further treatment needed.

## 2023-06-08 ENCOUNTER — Other Ambulatory Visit: Payer: Self-pay | Admitting: Internal Medicine

## 2023-06-08 MED ORDER — FLUCONAZOLE 150 MG PO TABS: ORAL_TABLET | ORAL | 0 refills | Status: DC

## 2023-07-10 ENCOUNTER — Other Ambulatory Visit: Payer: Self-pay | Admitting: Internal Medicine

## 2023-07-10 MED ORDER — ONDANSETRON 4 MG PO TBDP: 4.0000 mg | ORAL_TABLET | Freq: Three times a day (TID) | ORAL | 0 refills | Status: AC | PRN

## 2023-09-18 ENCOUNTER — Other Ambulatory Visit: Payer: Self-pay | Admitting: Internal Medicine

## 2023-09-18 MED ORDER — SCOPOLAMINE 1 MG/3DAYS TD PT72: 1.0000 | MEDICATED_PATCH | TRANSDERMAL | 0 refills | Status: AC

## 2024-01-17 ENCOUNTER — Other Ambulatory Visit: Payer: Self-pay | Admitting: Internal Medicine

## 2024-01-17 MED ORDER — FLUCONAZOLE 150 MG PO TABS: ORAL_TABLET | ORAL | 0 refills | Status: DC

## 2024-05-03 ENCOUNTER — Encounter: Payer: Self-pay | Admitting: Internal Medicine

## 2024-05-03 MED ORDER — FLUCONAZOLE 150 MG PO TABS: ORAL_TABLET | ORAL | 0 refills | Status: AC

## 2024-05-17 ENCOUNTER — Encounter: Payer: Self-pay | Admitting: Dermatology

## 2024-05-17 ENCOUNTER — Other Ambulatory Visit: Payer: Self-pay | Admitting: Dermatology

## 2024-05-17 ENCOUNTER — Ambulatory Visit: Payer: 59 | Admitting: Dermatology

## 2024-05-17 DIAGNOSIS — D492 Neoplasm of unspecified behavior of bone, soft tissue, and skin: Secondary | ICD-10-CM

## 2024-05-17 DIAGNOSIS — L814 Other melanin hyperpigmentation: Secondary | ICD-10-CM

## 2024-05-17 DIAGNOSIS — L219 Seborrheic dermatitis, unspecified: Secondary | ICD-10-CM

## 2024-05-17 DIAGNOSIS — D229 Melanocytic nevi, unspecified: Secondary | ICD-10-CM

## 2024-05-17 DIAGNOSIS — L578 Other skin changes due to chronic exposure to nonionizing radiation: Secondary | ICD-10-CM

## 2024-05-17 DIAGNOSIS — L813 Cafe au lait spots: Secondary | ICD-10-CM

## 2024-05-17 DIAGNOSIS — L821 Other seborrheic keratosis: Secondary | ICD-10-CM

## 2024-05-17 DIAGNOSIS — Z808 Family history of malignant neoplasm of other organs or systems: Secondary | ICD-10-CM

## 2024-05-17 DIAGNOSIS — L719 Rosacea, unspecified: Secondary | ICD-10-CM

## 2024-05-17 DIAGNOSIS — Z1283 Encounter for screening for malignant neoplasm of skin: Secondary | ICD-10-CM

## 2024-05-17 DIAGNOSIS — Z86018 Personal history of other benign neoplasm: Secondary | ICD-10-CM

## 2024-05-17 DIAGNOSIS — D1801 Hemangioma of skin and subcutaneous tissue: Secondary | ICD-10-CM

## 2024-05-17 MED ORDER — FINACEA 15 % EX FOAM: CUTANEOUS | 6 refills | Status: AC

## 2024-05-17 MED ORDER — DOXYCYCLINE HYCLATE 20 MG PO TABS: 40.0000 mg | ORAL_TABLET | Freq: Every day | ORAL | 6 refills | Status: DC

## 2024-05-17 NOTE — Progress Notes (Signed)
 Follow-Up Visit   Subjective  Debra Whitney is a 38 y.o. female who presents for the following: Skin Cancer Screening and Full Body Skin Exam. Hx of DN. Family Hx of MM.   Moles of concern on shoulders. Painful at times. Not so much a problem now. Mole on buttock that is growing and gets irritated.   The patient presents for Total-Body Skin Exam (TBSE) for skin cancer screening and mole check. The patient has spots, moles and lesions to be evaluated, some may be new or changing and the patient may have concern these could be cancer.  Rosacea. She has increased redness this past summer.  States she was not consistent with Doxycycline  20 mg and Finacea  foam or sunscreen.   The following portions of the chart were reviewed this encounter and updated as appropriate: medications, allergies, medical history  Review of Systems:  No other skin or systemic complaints except as noted in HPI or Assessment and Plan.  Objective  Well appearing patient in no apparent distress; mood and affect are within normal limits.  A full examination was performed including scalp, head, eyes, ears, nose, lips, neck, chest, axillae, abdomen, back, buttocks, bilateral upper extremities, bilateral lower extremities, hands, feet, fingers, toes, fingernails, and toenails. All findings within normal limits unless otherwise noted below.   Exam of nails limited by presence of nail polish.   Relevant physical exam findings are noted in the Assessment and Plan.  Left perianal 5 mm Flesh papule   Assessment & Plan   SKIN CANCER SCREENING PERFORMED TODAY.  FAMILY HISTORY OF SKIN CANCER What type(s): Melanoma Who affected: Aunt, Uncle   ACTINIC DAMAGE - Chronic condition, secondary to cumulative UV/sun exposure - diffuse scaly erythematous macules with underlying dyspigmentation - Recommend daily broad spectrum sunscreen SPF 30+ to sun-exposed areas, reapply every 2 hours as needed.  - Staying in the  shade or wearing long sleeves, sun glasses (UVA+UVB protection) and wide brim hats (4-inch brim around the entire circumference of the hat) are also recommended for sun protection.  - Call for new or changing lesions.  LENTIGINES, SEBORRHEIC KERATOSES, HEMANGIOMAS - Benign normal skin lesions - Benign-appearing - Call for any changes  MELANOCYTIC NEVI - Tan-brown and/or pink-flesh-colored symmetric macules and papules - 6 x 4 mm tan papule with darker center at right posterior shoulder. - 2.5 mm pink flesh papule at left posterior shoulder - 5 x 3 mm med brown macule at left anterior shoulder. - Benign appearing on exam today - Observation - Call clinic for new or changing moles - Recommend daily use of broad spectrum spf 30+ sunscreen to sun-exposed areas.  - Check nails when remove polish.    HISTORY OF DYSPLASTIC NEVUS. L medial upper thigh, mild. 04/15/2022 No evidence of recurrence today- Recommend regular full body skin exams Recommend daily broad spectrum sunscreen SPF 30+ to sun-exposed areas, reapply every 2 hours as needed.  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  Chronic and persistent condition with duration or expected duration over one year. Condition is bothersome/symptomatic for patient. Currently flared.  Rosacea is a chronic progressive skin condition usually affecting the face of adults, causing redness and/or acne bumps. It is treatable but not curable. It sometimes affects the eyes (ocular rosacea)  as well. It may respond to topical and/or systemic medication and can flare with stress, sun exposure, alcohol, exercise, topical steroids (including hydrocortisone/cortisone 10) and some foods.  Daily application of broad spectrum spf 30+ sunscreen to face is  recommended to reduce flares.  Patient denies grittiness of the eyes  Treatment Plan restart Doxycyline 20mg  2 po qd with food and drink restart Finacea  foam bid to face Recommend spf qam, samples given of sunscreens for sensitive skin  Doxycycline  should be taken with food to prevent nausea. Do not lay down for 30 minutes after taking. Be cautious with sun exposure and use good sun protection while on this medication. Pregnant women should not take this medication.    Counseling for BBL / IPL / Laser and Coordination of Care Discussed the treatment option of Broad Band Light (BBL) /Intense Pulsed Light (IPL)/ Laser for skin discoloration, including brown spots and redness.  Typically we recommend at least 1-3 treatment sessions about 5-8 weeks apart for best results.  Cannot have tanned skin when BBL performed, and regular use of sunscreen/photoprotection is advised after the procedure to help maintain results. The patient's condition may also require maintenance treatments in the future.  The fee for BBL / laser treatments is $350 per treatment session for the whole face.  A fee can be quoted for other parts of the body.  Insurance typically does not pay for BBL/laser treatments and therefore the fee is an out-of-pocket cost. Recommend prophylactic valtrex treatment. Once scheduled for procedure, will send Rx in prior to patient's appointment.     SEBORRHEIC DERMATITIS Scalp  Exam: scalp clear  Chronic condition with duration or expected duration over one year. Currently well-controlled.   Seborrheic Dermatitis is a chronic persistent rash characterized by pinkness and scaling most commonly of the mid face but also can occur on the scalp (dandruff), ears; mid chest, mid back and groin.  It tends to be exacerbated by stress and cooler weather.  People who have neurologic disease may experience new onset or exacerbation of existing seborrheic dermatitis.  The condition is not curable but  treatable and can be controlled.  Treatment Plan: Continue H&S shampoo with zinc,2-3x/wk let sit several minutes before rinsing  Continue OTC Scalpicin Extra Strength solution for spot treating to affected areas of scalp as needed for itch.   NEOPLASM OF SKIN Left perianal Epidermal / dermal shaving  Lesion diameter (cm):  0.5 Informed consent: discussed and consent obtained   Patient was prepped and draped in usual sterile fashion: Area prepped with alcohol. Anesthesia: the lesion was anesthetized in a standard fashion   Anesthetic:  1% lidocaine  w/ epinephrine 1-100,000 buffered w/ 8.4% NaHCO3 Instrument used: flexible razor blade   Hemostasis achieved with: pressure, aluminum chloride and electrodesiccation   Outcome: patient tolerated procedure well   Post-procedure details: wound care instructions given   Post-procedure details comment:  Ointment and small bandage applied  Specimen 1 - Surgical pathology Differential Diagnosis: Irritated nevus vs irritated acrochordon  Check Margins: No  Return in about 1 year (around 05/17/2025) for TBSE, HxDN.  I, Jill Parcell, CMA, am acting as scribe for Rexene Rattler, MD.    Documentation: I have reviewed the above documentation for accuracy and completeness, and I agree with the above.  Rexene Rattler, MD

## 2024-05-17 NOTE — Patient Instructions (Addendum)
 Rosacea:   Continue Doxycyline 20mg  2 tablets daily with food and drink Continue Finacea  foam twice daily to face Recommend spf qam  Doxycycline  should be taken with food to prevent nausea. Do not lay down for 30 minutes after taking. Be cautious with sun exposure and use good sun protection while on this medication. Pregnant women should not take this medication.    Counseling for BBL / IPL / Laser and Coordination of Care Discussed the treatment option of Broad Band Light (BBL) /Intense Pulsed Light (IPL)/ Laser for skin discoloration, including brown spots and redness.  Typically we recommend at least 1-3 treatment sessions about 5-8 weeks apart for best results.  Cannot have tanned skin when BBL performed, and regular use of sunscreen/photoprotection is advised after the procedure to help maintain results. The patient's condition may also require maintenance treatments in the future.  The fee for BBL / laser treatments is $350 per treatment session for the whole face.  A fee can be quoted for other parts of the body.  Insurance typically does not pay for BBL/laser treatments and therefore the fee is an out-of-pocket cost. Recommend prophylactic valtrex treatment. Once scheduled for procedure, will send Rx in prior to patient's appointment.     Wound Care Instructions  Cleanse wound gently with soap and water once a day then pat dry with clean gauze. Apply a thin coat of Petrolatum (petroleum jelly, Vaseline) over the wound (unless you have an allergy to this). We recommend that you use a new, sterile tube of Vaseline. Do not pick or remove scabs. Do not remove the yellow or white healing tissue from the base of the wound.  Cover the wound with fresh, clean, nonstick gauze and secure with paper tape. You may use Band-Aids in place of gauze and tape if the wound is small enough, but would recommend trimming much of the tape off as there is often too much. Sometimes Band-Aids can irritate  the skin.  You should call the office for your biopsy report after 1 week if you have not already been contacted.  If you experience any problems, such as abnormal amounts of bleeding, swelling, significant bruising, significant pain, or evidence of infection, please call the office immediately.  FOR ADULT SURGERY PATIENTS: If you need something for pain relief you may take 1 extra strength Tylenol  (acetaminophen ) AND 2 Ibuprofen  (200mg  each) together every 4 hours as needed for pain. (do not take these if you are allergic to them or if you have a reason you should not take them.) Typically, you may only need pain medication for 1 to 3 days.     Rosacea is a chronic progressive skin condition usually affecting the face of adults, causing redness and/or acne bumps. It is treatable but not curable. It sometimes affects the eyes (ocular rosacea) as well. It may respond to topical and/or systemic medication and can flare with stress, sun exposure, alcohol, exercise, topical steroids (including hydrocortisone/cortisone 10) and some foods.  Daily application of broad spectrum spf 30+ sunscreen to face is recommended to reduce flares.     Recommend daily broad spectrum sunscreen SPF 30+ to sun-exposed areas, reapply every 2 hours as needed. Call for new or changing lesions.  Staying in the shade or wearing long sleeves, sun glasses (UVA+UVB protection) and wide brim hats (4-inch brim around the entire circumference of the hat) are also recommended for sun protection.      Melanoma ABCDEs  Melanoma is the most dangerous type of  skin cancer, and is the leading cause of death from skin disease.  You are more likely to develop melanoma if you: Have light-colored skin, light-colored eyes, or red or blond hair Spend a lot of time in the sun Tan regularly, either outdoors or in a tanning bed Have had blistering sunburns, especially during childhood Have a close family member who has had a  melanoma Have atypical moles or large birthmarks  Early detection of melanoma is key since treatment is typically straightforward and cure rates are extremely high if we catch it early.   The first sign of melanoma is often a change in a mole or a new dark spot.  The ABCDE system is a way of remembering the signs of melanoma.  A for asymmetry:  The two halves do not match. B for border:  The edges of the growth are irregular. C for color:  A mixture of colors are present instead of an even brown color. D for diameter:  Melanomas are usually (but not always) greater than 6mm - the size of a pencil eraser. E for evolution:  The spot keeps changing in size, shape, and color.  Please check your skin once per month between visits. You can use a small mirror in front and a large mirror behind you to keep an eye on the back side or your body.   If you see any new or changing lesions before your next follow-up, please call to schedule a visit.  Please continue daily skin protection including broad spectrum sunscreen SPF 30+ to sun-exposed areas, reapplying every 2 hours as needed when you're outdoors.   Staying in the shade or wearing long sleeves, sun glasses (UVA+UVB protection) and wide brim hats (4-inch brim around the entire circumference of the hat) are also recommended for sun protection.      Due to recent changes in healthcare laws, you may see results of your pathology and/or laboratory studies on MyChart before the doctors have had a chance to review them. We understand that in some cases there may be results that are confusing or concerning to you. Please understand that not all results are received at the same time and often the doctors may need to interpret multiple results in order to provide you with the best plan of care or course of treatment. Therefore, we ask that you please give us  2 business days to thoroughly review all your results before contacting the office for  clarification. Should we see a critical lab result, you will be contacted sooner.   If You Need Anything After Your Visit  If you have any questions or concerns for your doctor, please call our main line at [phone] and press option 4 to reach your doctor's medical assistant. If no one answers, please leave a voicemail as directed and we will return your call as soon as possible. Messages left after 4 pm will be answered the following business day.   You may also send us  a message via MyChart. We typically respond to MyChart messages within 1-2 business days.  For prescription refills, please ask your pharmacy to contact our office. Our fax number is [phone].  If you have an urgent issue when the clinic is closed that cannot wait until the next business day, you can page your doctor at the number below.    Please note that while we do our best to be available for urgent issues outside of office hours, we are not available 24/7.   If  you have an urgent issue and are unable to reach us , you may choose to seek medical care at your doctor's office, retail clinic, urgent care center, or emergency room.  If you have a medical emergency, please immediately call 911 or go to the emergency department.  Pager Numbers  - Dr. Hester: [phone]  - Dr. Jackquline: [phone]  - Dr. Claudene: [phone]   - Dr. Raymund: [phone]  In the event of inclement weather, please call our main line at [phone] for an update on the status of any delays or closures.  Dermatology Medication Tips: Please keep the boxes that topical medications come in in order to help keep track of the instructions about where and how to use these. Pharmacies typically print the medication instructions only on the boxes and not directly on the medication tubes.   If your medication is too expensive, please contact our office at [phone] option 4 or send us  a message through MyChart.   We are unable to  tell what your co-pay for medications will be in advance as this is different depending on your insurance coverage. However, we may be able to find a substitute medication at lower cost or fill out paperwork to get insurance to cover a needed medication.   If a prior authorization is required to get your medication covered by your insurance company, please allow us  1-2 business days to complete this process.  Drug prices often vary depending on where the prescription is filled and some pharmacies may offer cheaper prices.  The website www.goodrx.com contains coupons for medications through different pharmacies. The prices here do not account for what the cost may be with help from insurance (it may be cheaper with your insurance), but the website can give you the price if you did not use any insurance.  - You can print the associated coupon and take it with your prescription to the pharmacy.  - You may also stop by our office during regular business hours and pick up a GoodRx coupon card.  - If you need your prescription sent electronically to a different pharmacy, notify our office through [facility] or by phone at [phone] option 4.     Si Usted Necesita Algo Despus de Su Visita  Tambin puede enviarnos un mensaje a travs de Clinical Cytogeneticist. Por lo general respondemos a los mensajes de MyChart en el transcurso de 1 a 2 das hbiles.  Para renovar recetas, por favor pida a su farmacia que se ponga en contacto con nuestra oficina. Randi lakes de fax es [place] [phone].  Si tiene un asunto urgente cuando la clnica est cerrada y que no puede esperar hasta el siguiente da hbil, puede llamar/localizar a su doctor(a) al nmero que aparece a continuacin.   Por favor, tenga en cuenta que aunque hacemos todo lo posible para estar disponibles para asuntos urgentes fuera del horario de [place], no estamos disponibles las 24 horas del da, los 7 [address]  [address] de la [place].   Si tiene un problema  urgente y no puede comunicarse con nosotros, puede optar por buscar atencin mdica  en el consultorio de su doctor(a), en una clnica privada, en un centro de atencin urgente o en una sala de emergencias.  Si tiene engineer, drilling, por favor llame inmediatamente al 911 o vaya a la sala de emergencias.  Nmeros de bper  - Dr. Hester: [phone]  - Dra. Jackquline: [phone]  - Dr. Claudene: [phone]  - Dra. Kitts: [phone]  En caso de  inclemencias del [place], por favor llame a nuestra lnea principal al [phone] para una actualizacin sobre el [place] de cualquier retraso o cierre.  Consejos para la medicacin en dermatologa: Por favor, guarde las cajas en las que vienen los medicamentos de uso tpico para ayudarle a seguir las instrucciones sobre dnde y cmo usarlos. Las farmacias generalmente imprimen las instrucciones del medicamento slo en las cajas y no directamente en los tubos del [place].   Si su medicamento es muy caro, por favor, pngase en contacto con landry rieger llamando al [phone] y presione la opcin 4 o envenos un mensaje a travs de Clinical Cytogeneticist.   No podemos decirle cul ser su copago por los medicamentos por adelantado ya que esto es diferente dependiendo de la cobertura de su seguro. Sin embargo, es posible que podamos encontrar un medicamento sustituto a audiological scientist un formulario para que el seguro cubra el medicamento que se considera necesario.   Si se requiere una autorizacin previa para que su compaa de seguros cubra su medicamento, por favor permtanos de 1 a 2 das hbiles para completar este proceso.  Los precios de los medicamentos varan con frecuencia dependiendo del environmental consultant de dnde se surte la receta y alguna farmacias pueden ofrecer precios ms baratos.  El sitio web www.goodrx.com tiene cupones para medicamentos de health and safety inspector. Los precios aqu no tienen en cuenta lo que podra costar con la ayuda del  seguro (puede ser ms barato con su seguro), pero el sitio web puede darle el precio si no utiliz tourist information centre manager.  - Puede imprimir el cupn correspondiente y llevarlo con su receta a la farmacia.  - Tambin puede pasar por nuestra oficina durante el horario de atencin regular y education officer, museum una tarjeta de cupones de GoodRx.  - Si necesita que su receta se enve electrnicamente a una farmacia diferente, informe a nuestra oficina a travs de MyChart de [place] o por telfono llamando al [phone] y presione la opcin 4.

## 2024-05-19 LAB — SURGICAL PATHOLOGY

## 2024-05-23 ENCOUNTER — Ambulatory Visit: Payer: Self-pay | Admitting: Dermatology

## 2024-05-23 NOTE — Telephone Encounter (Signed)
Left message advising patient biopsy was benign and no further treatment needed.

## 2024-05-23 NOTE — Telephone Encounter (Signed)
-----   Message from Rexene Rattler, MD sent at 05/23/2024  9:50 AM EST ----- 1. Skin, left perianal :       MELANOCYTIC NEVUS, INTRADERMAL TYPE   Benign mole - please call patient

## 2024-05-31 ENCOUNTER — Encounter: Payer: Self-pay | Admitting: Internal Medicine

## 2024-05-31 MED ORDER — AMOXICILLIN 500 MG PO CAPS: 500.0000 mg | ORAL_CAPSULE | Freq: Three times a day (TID) | ORAL | 0 refills | Status: AC

## 2024-05-31 MED ORDER — FLUCONAZOLE 150 MG PO TABS: 150.0000 mg | ORAL_TABLET | Freq: Once | ORAL | 0 refills | Status: AC

## 2024-06-17 ENCOUNTER — Other Ambulatory Visit: Payer: Self-pay | Admitting: Dermatology

## 2024-07-02 ENCOUNTER — Emergency Department

## 2024-07-02 ENCOUNTER — Emergency Department
Admission: EM | Admit: 2024-07-02 | Discharge: 2024-07-02 | Disposition: A | Discharge status: Home / Self Care | Attending: Emergency Medicine | Admitting: Emergency Medicine

## 2024-07-02 ENCOUNTER — Other Ambulatory Visit: Payer: Self-pay

## 2024-07-02 DIAGNOSIS — I10 Essential (primary) hypertension: Secondary | ICD-10-CM | POA: Diagnosis not present

## 2024-07-02 DIAGNOSIS — R0789 Other chest pain: Secondary | ICD-10-CM | POA: Insufficient documentation

## 2024-07-02 DIAGNOSIS — R110 Nausea: Secondary | ICD-10-CM | POA: Diagnosis present

## 2024-07-02 DIAGNOSIS — R7989 Other specified abnormal findings of blood chemistry: Secondary | ICD-10-CM | POA: Diagnosis not present

## 2024-07-02 DIAGNOSIS — K8050 Calculus of bile duct without cholangitis or cholecystitis without obstruction: Secondary | ICD-10-CM | POA: Insufficient documentation

## 2024-07-02 LAB — D-DIMER, QUANTITATIVE: D-Dimer, Quant: 0.43 ug{FEU}/mL (ref 0.00–0.50)

## 2024-07-02 LAB — HEPATIC FUNCTION PANEL
ALT: 28 U/L (ref 0–44)
AST: 24 U/L (ref 15–41)
Albumin: 4.7 g/dL (ref 3.5–5.0)
Alkaline Phosphatase: 65 U/L (ref 38–126)
Bilirubin, Direct: 0.1 mg/dL (ref 0.0–0.2)
Indirect Bilirubin: 0.2 mg/dL — ABNORMAL LOW (ref 0.3–0.9)
Total Bilirubin: 0.4 mg/dL (ref 0.0–1.2)
Total Protein: 7.7 g/dL (ref 6.5–8.1)

## 2024-07-02 LAB — CBC
HCT: 41.1 % (ref 36.0–46.0)
Hemoglobin: 13.9 g/dL (ref 12.0–15.0)
MCH: 30.3 pg (ref 26.0–34.0)
MCHC: 33.8 g/dL (ref 30.0–36.0)
MCV: 89.7 fL (ref 80.0–100.0)
Platelets: 396 10*3/uL (ref 150–400)
RBC: 4.58 MIL/uL (ref 3.87–5.11)
RDW: 12.9 % (ref 11.5–15.5)
WBC: 9.2 10*3/uL (ref 4.0–10.5)
nRBC: 0 % (ref 0.0–0.2)

## 2024-07-02 LAB — LIPASE, BLOOD: Lipase: 38 U/L (ref 11–51)

## 2024-07-02 LAB — TROPONIN T, HIGH SENSITIVITY
Troponin T High Sensitivity: 39 ng/L — ABNORMAL HIGH (ref 0–19)
Troponin T High Sensitivity: 40 ng/L — ABNORMAL HIGH (ref 0–19)

## 2024-07-02 LAB — BASIC METABOLIC PANEL WITH GFR
Anion gap: 13 (ref 5–15)
BUN: 8 mg/dL (ref 6–20)
CO2: 25 mmol/L (ref 22–32)
Calcium: 9.3 mg/dL (ref 8.9–10.3)
Chloride: 102 mmol/L (ref 98–111)
Creatinine, Ser: 0.62 mg/dL (ref 0.44–1.00)
GFR, Estimated: >60 mL/min
Glucose, Bld: 105 mg/dL — ABNORMAL HIGH (ref 70–99)
Potassium: 3.7 mmol/L (ref 3.5–5.1)
Sodium: 139 mmol/L (ref 135–145)

## 2024-07-02 NOTE — ED Provider Notes (Signed)
 "  [facility] Provider Note    Event Date/Time   First MD Initiated Contact with Patient 07/02/24 0423     (approximate)   History   Chest Pain   HPI Debra Whitney is a 39 y.o. female who has previously been diagnosed with atrial fibrillation and was briefly on anticoagulation but then was taken back off of it, hypertension, reflux, and obesity.  She presents for evaluation of chest pain.  She states that for the last 2 days or so she has had some pressure in her chest associated with nausea but no vomiting.  The pain is worse when she changes positions at times and she is not sure if it is associated with food.  Sometimes she feels short of breath but not always.  She does not take blood thinners for her previous atrial fibrillation diagnosis although this seemed to be a transient issue a few years ago.  She was brought to the emergency department by her husband and is able to ambulate without any difficulty and without requiring assistance.  She has no history of blood clots in the legs of the lungs and has not had any unilateral leg pain or swelling although she reports a little bit of swelling in both of her feet and legs.     Physical Exam   Triage Vital Signs: ED Triage Vitals  Encounter Vitals Group     BP 07/02/24 0408 (!) 150/97     Girls Systolic BP Percentile --      Girls Diastolic BP Percentile --      Boys Systolic BP Percentile --      Boys Diastolic BP Percentile --      Pulse Rate 07/02/24 0408 88     Resp 07/02/24 0408 18     Temp 07/02/24 0408 98.5 F (36.9 C)     Temp Source 07/02/24 0408 Oral     SpO2 07/02/24 0408 98 %     Weight 07/02/24 0405 86.2 kg (190 lb)     Height 07/02/24 0405 1.549 m (5' 1)     Head Circumference --      Peak Flow --      Pain Score 07/02/24 0405 4     Pain Loc --      Pain Education --      Exclude from Growth Chart --     Most recent vital signs: Vitals:   07/02/24 0408 07/02/24 0500  BP: (!)  150/97 (!) 125/93  Pulse: 88 81  Resp: 18 17  Temp: 98.5 F (36.9 C) 98.4 F (36.9 C)  SpO2: 98% 99%    General: Awake, alert, seems understandably anxious under the circumstances but not in severe distress. CV:  Good peripheral perfusion.  Normal heart sounds, regular rate and rhythm. Resp:  Normal effort. Speaking easily and comfortably, no accessory muscle usage nor intercostal retractions.  Lungs are clear to auscultation bilaterally. Abd:  Mildly obese, soft and nondistended abdomen, tender to palpation in the right upper quadrant with equivocal Murphy sign.  No lower abdominal tenderness and no pulsatile abdominal mass.   ED Results / Procedures / Treatments   Labs (all labs ordered are listed, but only abnormal results are displayed) Labs Reviewed  BASIC METABOLIC PANEL WITH GFR - Abnormal; Notable for the following components:      Result Value   Glucose, Bld 105 (*)    All other components within normal limits  HEPATIC FUNCTION PANEL - Abnormal; Notable  for the following components:   Indirect Bilirubin 0.2 (*)    All other components within normal limits  TROPONIN T, HIGH SENSITIVITY - Abnormal; Notable for the following components:   Troponin T High Sensitivity 39 (*)    All other components within normal limits  TROPONIN T, HIGH SENSITIVITY - Abnormal; Notable for the following components:   Troponin T High Sensitivity 40 (*)    All other components within normal limits  CBC  LIPASE, BLOOD  D-DIMER, QUANTITATIVE     EKG  ED ECG REPORT I, Darleene Dome, the attending physician, personally viewed and interpreted this ECG.  Date: 07/02/2024 EKG Time: 4:04 AM Rate: 91 Rhythm: normal sinus rhythm QRS Axis: normal Intervals: normal ST/T Wave abnormalities: normal Narrative Interpretation: no evidence of acute ischemia    RADIOLOGY I independently viewed and interpreted the patient's two-view chest x-ray and I can see no evidence of pneumonia, pneumothorax,  nor pulmonary edema.  I also read the radiologist's report, which confirmed no acute findings.   PROCEDURES:  Critical Care performed: No  Procedures    IMPRESSION / MDM / ASSESSMENT AND PLAN / ED COURSE  I reviewed the triage vital signs and the nursing notes.                              Differential diagnosis includes, but is not limited to, pericarditis, nonspecific noncardiac chest pain, ACS including unstable angina, PE, GI pain from pancreatitis or biliary colic, less likely ACS  Patient's presentation is most consistent with acute presentation with potential threat to life or bodily function.  Labs/studies ordered (see ED course for additional labs and studies that may have been added later): D-dimer, high-sensitivity troponin, BMP, lipase, hepatic function panel, CBC, two-view chest x-ray, abdominal ultrasound  Interventions/Medications given:  Medications - No data to display  (Note:  hospital course my include additional interventions and/or labs/studies not listed above.)   Vital signs are reassuring other than some diastolic hypertension.  Patient should be PERC negative, but given the atypical presentation and her somewhat unusual history of A-fib, I am not comfortable considering her low risk and will further risk stratify her with a D-dimer.  Chest x-ray is normal and EKG is normal.  However her initial high-sensitivity troponin is slightly elevated at 39 with normal renal function and metabolic panel and CBC in general.  Lipase and hepatic function panel are pending.  I will try Toradol  15 mg IV and Zofran  4 mg IV; patient declines anything stronger.  Patient may benefit from hospitalization if repeat high-sensitivity troponin continues to rise.    Clinical Course as of 07/02/24 0723  Sat Jul 02, 2024  0542 D-Dimer, Quant: 0.43 WNL [CF]  0559 Normal hepatic function panel and lipase [CF]  0703 Patient's repeat high-sensitivity troponin is essentially unchanged  at 40 (previous value was 39). [CF]  [id number] I had an extended conversation with the patient and discussed and considered hospitalization again.  However, she is low risk for ACS and she has a stable high-sensitivity troponin.  I independently viewed and interpreted her right upper quadrant ultrasound which is consistent with gallstones but no evidence of cholecystitis, confirmed by radiology.  It is possible and perhaps likely that the gallstones are causing her pain, but do not explain the troponin.  Regardless, the patient is feeling little bit better now and understands the abnormalities but does not want to be admitted including due to the fact  that this hospital is out of network for her.  In fact she said that she will follow-up with [facility] and does not need a referral to cardiology because she has to go within her own network.  I think this is a reasonable plan but I gave strict return precautions to the nearest emergency department should she develop new or worsening symptoms that concerns her. [CF]    Clinical Course User Index [CF] Gordan Huxley, MD     FINAL CLINICAL IMPRESSION(S) / ED DIAGNOSES   Final diagnoses:  Atypical chest pain  Biliary colic  Elevated troponin level     Rx / DC Orders   ED Discharge Orders     None        Note:  This document was prepared using Dragon voice recognition software and may include unintentional dictation errors.   Gordan Huxley, MD 07/02/24 [phone]  "

## 2024-07-02 NOTE — Discharge Instructions (Signed)
 As we discussed, your evaluation was generally reassuring but a bit confusing tonight.  Your high sensitive troponin level (which reflects strain or injury to your heart) was slightly elevated but stable.  Your EKG was normal which is reassuring, as was the rest of your labs.  Your ultrasound shows that you have gallstones which could be causing your pain, but should not in general cause your troponin level to rise.  We discussed keeping you in the hospital, but we feel that it is most likely not the case that you are having an emergent condition that requires you to stay, and particular given your preference to follow-up at [facility], it is reasonable to go home and follow-up at the next available opportunity.  However, if you develop any new or worsening symptoms that concern you, please return immediately to the nearest emergency department.

## 2024-07-02 NOTE — ED Triage Notes (Signed)
 Patient brought in by husband from home for mid sternal chest pressure/tightness since Thursday with some SoB, nausea without vomiting, and BLE swelling. Symptoms worsened by movement. Hx of same but symptoms resolved without treatment. Hx AFIB, no blood thinners. AxOx4, ambulatory to triage.

## 2024-08-29 ENCOUNTER — Ambulatory Visit: Admitting: Dermatology

## 2025-05-22 ENCOUNTER — Encounter: Admitting: Dermatology
